# Patient Record
Sex: Female | Born: 1955
Health system: Southern US, Community
[De-identification: ages and names within clinical notes are randomized; demographics above are authoritative.]

## PROBLEM LIST (undated history)

## (undated) DIAGNOSIS — E559 Vitamin D deficiency, unspecified: Secondary | ICD-10-CM

## (undated) DIAGNOSIS — E119 Type 2 diabetes mellitus without complications: Secondary | ICD-10-CM

## (undated) DIAGNOSIS — I10 Essential (primary) hypertension: Secondary | ICD-10-CM

## (undated) DIAGNOSIS — F419 Anxiety disorder, unspecified: Secondary | ICD-10-CM

## (undated) DIAGNOSIS — R896 Abnormal cytological findings in specimens from other organs, systems and tissues: Secondary | ICD-10-CM

## (undated) DIAGNOSIS — N890 Mild vaginal dysplasia: Secondary | ICD-10-CM

## (undated) DIAGNOSIS — I82409 Acute embolism and thrombosis of unspecified deep veins of unspecified lower extremity: Secondary | ICD-10-CM

## (undated) DIAGNOSIS — M8580 Other specified disorders of bone density and structure, unspecified site: Secondary | ICD-10-CM

## (undated) DIAGNOSIS — N7011 Chronic salpingitis: Secondary | ICD-10-CM

## (undated) DIAGNOSIS — R32 Unspecified urinary incontinence: Secondary | ICD-10-CM

## (undated) HISTORY — DX: Vitamin D deficiency, unspecified: E55.9

## (undated) HISTORY — DX: Other specified disorders of bone density and structure, unspecified site: M85.80

## (undated) HISTORY — DX: Chronic salpingitis: N70.11

## (undated) HISTORY — DX: Abnormal cytological findings in specimens from other organs, systems and tissues: R89.6

## (undated) HISTORY — PX: CARDIAC CATHETERIZATION: SHX172

## (undated) HISTORY — DX: Type 2 diabetes mellitus without complications: E11.9

## (undated) HISTORY — PX: PELVIC LAPAROSCOPY: SHX162

## (undated) HISTORY — DX: Unspecified urinary incontinence: R32

## (undated) HISTORY — DX: Acute embolism and thrombosis of unspecified deep veins of unspecified lower extremity: I82.409

## (undated) HISTORY — DX: Mild vaginal dysplasia: N89.0

## (undated) HISTORY — DX: Anxiety disorder, unspecified: F41.9

---

## 1994-09-05 HISTORY — PX: MYOMECTOMY ABDOMINAL APPROACH: SUR870

## 1998-02-19 ENCOUNTER — Other Ambulatory Visit: Admission: RE | Admit: 1998-02-19 | Discharge: 1998-02-19 | Payer: Self-pay | Admitting: Gynecology

## 1998-06-19 ENCOUNTER — Ambulatory Visit (HOSPITAL_COMMUNITY): Admission: RE | Admit: 1998-06-19 | Discharge: 1998-06-19 | Payer: Self-pay | Admitting: Cardiovascular Disease

## 1999-08-12 ENCOUNTER — Other Ambulatory Visit: Admission: RE | Admit: 1999-08-12 | Discharge: 1999-08-12 | Payer: Self-pay | Admitting: Gynecology

## 1999-08-15 ENCOUNTER — Inpatient Hospital Stay (HOSPITAL_COMMUNITY): Admission: AD | Admit: 1999-08-15 | Discharge: 1999-08-15 | Payer: Self-pay | Admitting: Gynecology

## 1999-09-06 HISTORY — PX: ABDOMINAL HYSTERECTOMY: SHX81

## 1999-09-19 ENCOUNTER — Inpatient Hospital Stay (HOSPITAL_COMMUNITY): Admission: RE | Admit: 1999-09-19 | Discharge: 1999-09-19 | Payer: Self-pay | Admitting: Gynecology

## 1999-10-22 ENCOUNTER — Encounter (INDEPENDENT_AMBULATORY_CARE_PROVIDER_SITE_OTHER): Payer: Self-pay

## 1999-10-22 ENCOUNTER — Inpatient Hospital Stay (HOSPITAL_COMMUNITY): Admission: RE | Admit: 1999-10-22 | Discharge: 1999-10-24 | Payer: Self-pay | Admitting: Gynecology

## 2001-03-16 ENCOUNTER — Other Ambulatory Visit: Admission: RE | Admit: 2001-03-16 | Discharge: 2001-03-16 | Payer: Self-pay | Admitting: Gynecology

## 2002-03-27 ENCOUNTER — Other Ambulatory Visit: Admission: RE | Admit: 2002-03-27 | Discharge: 2002-03-27 | Payer: Self-pay | Admitting: Gynecology

## 2002-06-14 ENCOUNTER — Ambulatory Visit (HOSPITAL_COMMUNITY): Admission: RE | Admit: 2002-06-14 | Discharge: 2002-06-14 | Payer: Self-pay | Admitting: Internal Medicine

## 2002-06-14 ENCOUNTER — Encounter: Payer: Self-pay | Admitting: Internal Medicine

## 2004-03-10 ENCOUNTER — Other Ambulatory Visit: Admission: RE | Admit: 2004-03-10 | Discharge: 2004-03-10 | Payer: Self-pay | Admitting: Gynecology

## 2004-08-06 ENCOUNTER — Emergency Department (HOSPITAL_COMMUNITY): Admission: EM | Admit: 2004-08-06 | Discharge: 2004-08-06 | Payer: Self-pay | Admitting: Emergency Medicine

## 2006-02-03 ENCOUNTER — Other Ambulatory Visit: Admission: RE | Admit: 2006-02-03 | Discharge: 2006-02-03 | Payer: Self-pay | Admitting: Gynecology

## 2007-02-05 ENCOUNTER — Other Ambulatory Visit: Admission: RE | Admit: 2007-02-05 | Discharge: 2007-02-05 | Payer: Self-pay | Admitting: Gynecology

## 2008-09-07 ENCOUNTER — Emergency Department (HOSPITAL_COMMUNITY): Admission: EM | Admit: 2008-09-07 | Discharge: 2008-09-07 | Payer: Self-pay | Admitting: Emergency Medicine

## 2009-03-09 ENCOUNTER — Emergency Department (HOSPITAL_COMMUNITY): Admission: EM | Admit: 2009-03-09 | Discharge: 2009-03-09 | Payer: Self-pay | Admitting: Emergency Medicine

## 2009-03-13 ENCOUNTER — Encounter: Payer: Self-pay | Admitting: Gynecology

## 2009-03-13 ENCOUNTER — Other Ambulatory Visit: Admission: RE | Admit: 2009-03-13 | Discharge: 2009-03-13 | Payer: Self-pay | Admitting: Gynecology

## 2009-03-13 ENCOUNTER — Ambulatory Visit: Payer: Self-pay | Admitting: Gynecology

## 2010-01-19 ENCOUNTER — Ambulatory Visit: Payer: Self-pay | Admitting: Gynecology

## 2010-08-26 ENCOUNTER — Ambulatory Visit: Payer: Self-pay | Admitting: Vascular Surgery

## 2010-09-05 DIAGNOSIS — M8580 Other specified disorders of bone density and structure, unspecified site: Secondary | ICD-10-CM

## 2010-09-05 HISTORY — DX: Other specified disorders of bone density and structure, unspecified site: M85.80

## 2010-11-04 DIAGNOSIS — N890 Mild vaginal dysplasia: Secondary | ICD-10-CM

## 2010-11-04 HISTORY — DX: Mild vaginal dysplasia: N89.0

## 2010-11-24 ENCOUNTER — Encounter (INDEPENDENT_AMBULATORY_CARE_PROVIDER_SITE_OTHER): Payer: Private Health Insurance - Indemnity | Admitting: Gynecology

## 2010-11-24 ENCOUNTER — Other Ambulatory Visit: Payer: Self-pay | Admitting: Gynecology

## 2010-11-24 ENCOUNTER — Other Ambulatory Visit (HOSPITAL_COMMUNITY)
Admission: RE | Admit: 2010-11-24 | Discharge: 2010-11-24 | Disposition: A | Discharge status: Home / Self Care | Payer: Private Health Insurance - Indemnity | Source: Ambulatory Visit | Attending: Gynecology | Admitting: Gynecology

## 2010-11-24 DIAGNOSIS — R823 Hemoglobinuria: Secondary | ICD-10-CM

## 2010-11-24 DIAGNOSIS — Z124 Encounter for screening for malignant neoplasm of cervix: Secondary | ICD-10-CM | POA: Insufficient documentation

## 2010-11-24 DIAGNOSIS — Z833 Family history of diabetes mellitus: Secondary | ICD-10-CM

## 2010-11-24 DIAGNOSIS — Z01419 Encounter for gynecological examination (general) (routine) without abnormal findings: Secondary | ICD-10-CM

## 2010-11-24 DIAGNOSIS — Z13220 Encounter for screening for lipoid disorders: Secondary | ICD-10-CM

## 2010-12-07 ENCOUNTER — Encounter (INDEPENDENT_AMBULATORY_CARE_PROVIDER_SITE_OTHER): Payer: Private Health Insurance - Indemnity

## 2010-12-07 ENCOUNTER — Other Ambulatory Visit (INDEPENDENT_AMBULATORY_CARE_PROVIDER_SITE_OTHER): Payer: Private Health Insurance - Indemnity

## 2010-12-07 DIAGNOSIS — M899 Disorder of bone, unspecified: Secondary | ICD-10-CM

## 2010-12-07 DIAGNOSIS — E7800 Pure hypercholesterolemia, unspecified: Secondary | ICD-10-CM

## 2010-12-17 ENCOUNTER — Ambulatory Visit (INDEPENDENT_AMBULATORY_CARE_PROVIDER_SITE_OTHER): Payer: Private Health Insurance - Indemnity | Admitting: Gynecology

## 2010-12-17 DIAGNOSIS — N870 Mild cervical dysplasia: Secondary | ICD-10-CM

## 2010-12-17 DIAGNOSIS — E559 Vitamin D deficiency, unspecified: Secondary | ICD-10-CM

## 2010-12-20 LAB — COMPREHENSIVE METABOLIC PANEL
ALT: 21 U/L (ref 0–35)
BUN: 8 mg/dL (ref 6–23)
CO2: 25 mEq/L (ref 19–32)
Calcium: 9.1 mg/dL (ref 8.4–10.5)
Creatinine, Ser: 0.86 mg/dL (ref 0.4–1.2)
GFR calc non Af Amer: >60 mL/min (ref >60)
Glucose, Bld: 98 mg/dL (ref 70–99)
Sodium: 138 mEq/L (ref 135–145)
Total Protein: 6.4 g/dL (ref 6.0–8.3)

## 2010-12-20 LAB — CBC
Hemoglobin: 13.4 g/dL (ref 12.0–15.0)
MCHC: 33.5 g/dL (ref 30.0–36.0)
MCV: 85 fL (ref 78.0–100.0)
RBC: 4.7 MIL/uL (ref 3.87–5.11)
RDW: 15.3 % (ref 11.5–15.5)

## 2010-12-20 LAB — DIFFERENTIAL
Eosinophils Absolute: 0.2 10*3/uL (ref 0.0–0.7)
Lymphocytes Relative: 16 % (ref 12–46)
Lymphs Abs: 1.8 10*3/uL (ref 0.7–4.0)
Monocytes Relative: 5 % (ref 3–12)
Neutro Abs: 8.8 10*3/uL — ABNORMAL HIGH (ref 1.7–7.7)
Neutrophils Relative %: 77 % (ref 43–77)

## 2010-12-20 LAB — POCT CARDIAC MARKERS
CKMB, poc: <1 ng/mL — ABNORMAL LOW (ref 1.0–8.0)
Myoglobin, poc: 36.3 ng/mL (ref 12–200)
Troponin i, poc: <0.05 ng/mL (ref 0.00–0.09)
Troponin i, poc: <0.05 ng/mL (ref 0.00–0.09)

## 2010-12-20 LAB — LIPASE, BLOOD: Lipase: 25 U/L (ref 11–59)

## 2010-12-27 ENCOUNTER — Other Ambulatory Visit: Payer: Private Health Insurance - Indemnity

## 2011-01-18 NOTE — Assessment & Plan Note (Signed)
OFFICE VISIT   Tracey Buchanan  DOB:  1956/04/07                                       08/26/2010  ZOXWR#:[id number]   CHIEF COMPLAINT:  Bilateral leg pain.   HISTORY OF PRESENT ILLNESS:  The patient is a 55 year old female  referred by Dr. Hyacinth Meeker for evaluation of lower extremity pain and  swelling.  The patient states she has developed fatigue in her legs over  the last 4 years that has been slowly progressively worse.  She  occasionally has a sharp pain behind the knee on both sides.  She states  that the pain is worse and the swelling is worse when standing for long  periods.  She sits at her job usually.  She occasionally has to take  some ibuprofen for the pain but has not required stronger medication for  this.  She has also tried physical therapy in the past with some mild  improvement.  She also tried some mild compression with minimal  improvement.  She has no prior history of DVT.  She has no prior history  of trauma to her lower extremities.  She has no significant family  history of varicose veins or vascular disease.   CHRONIC MEDICAL PROBLEMS:  Hypertension and anxiety.  These are both  followed by Dr. Hyacinth Meeker.   PAST SURGICAL HISTORY:  Hysterectomy.   SOCIAL HISTORY:  She is single.  She is a Games developer.  She has no children.  She smokes half-pack of cigarettes per day.  She  drinks 1 glass of wine nightly.   FAMILY HISTORY:  As mentioned above.   REVIEW OF SYSTEMS:  A 12-point review of systems was performed with the  patient.  Please see intake referral form for details regarding this.   MEDICATIONS:  Benicar and lorazepam.   ALLERGIES:  She has no known drug allergies.   PHYSICAL EXAMINATION:  Blood pressure 136/76 in the left arm, heart rate  68 and regular, respirations 20.  HEENT:  Unremarkable.  Neck has 2+  carotid pulses.  Chest clear to auscultation.  Cardiac exam is regular  rate rhythm without murmur.   Abdomen is soft, nontender, nondistended.  No masses.  Extremities:  She has no major joint deformities.  She has  2+ radial, femoral, dorsalis pedis and posterior tibial pulses  bilaterally.  Skin has no open ulcers or rashes.  Neurologic exam shows  symmetric upper extremity motion and motor strength, which is 5/5.   She had bilateral venous duplex exam performed today, which showed no  evidence of DVT.  She did have incompetency and reflux in the common  femoral vein bilaterally.  The superficial and deep venous systems were  otherwise competent.   In summary, the patient has lower extremity pain and aching with  occasional swelling.  Her arterial circulation is intact with no  deficit.  She does have some evidence of venous incompetence with deep  venous reflux in the common femoral vein.  This certainly could  contribute to some of her symptoms.  I did have a lengthy discussion  with the patient today about the pathophysiology of this and informed  her that there are really no good surgical procedures for reflux of the  deep veins but that we could give her some symptomatic relief with  compression therapy.  She was  given a prescription today for thigh-high  compression stockings as well as knee-high compression stockings.  She  will try one of the two of these and try to wear her compression as much  as possible for symptomatic relief.  She will follow up with [place] on an as-  needed basis.     Janetta Hora. Fields, MD  Electronically Signed   CEF/MEDQ  D:  08/26/2010  T:  08/27/2010  Job:  [id number]   cc:   Dr. Jacalyn Lefevre

## 2011-01-18 NOTE — Procedures (Signed)
DUPLEX DEEP VENOUS EXAM - LOWER EXTREMITY   INDICATION:  Rule out venous insufficiency.   HISTORY:  Edema:  yes  Trauma/Surgery:  no  Pain:  yes  PE:  no  Previous DVT:  Yes in 1979  Anticoagulants:  no  Other:   DUPLEX EXAM:                CFV   SFV   PopV  PTV    GSV                R  L  R  L  R  L  R   L  R  L  Thrombosis    o  o  o  o  o  o  o   o  o  o  Spontaneous   +  +  +  +  +  +  +   +  +  +  Phasic        +  +  +  +  +  +  +   +  +  +  Augmentation  +  +  +  +  +  +  +   +  +  +  Compressible  +  +  +  +  +  +  +   +  +  +  Competent     0  0  +  +  +  +  +   +  +  +   Legend:  + - yes  o - no  p - partial  D - decreased   IMPRESSION:  1. Bilateral lower extremity deep veins are positive for clinically      significant insufficiency at the level of the common femoral vein.  2. Negative study for deep venous thrombosis.    _____________________________  Janetta Hora Darrick Penna, MD   EM/MEDQ  D:  08/26/2010  T:  08/26/2010  Job:  [id number]

## 2011-01-21 NOTE — Op Note (Signed)
[facility]  Patient:    Tracey Buchanan, Tracey Buchanan                      MRN: [id number] Proc. Date: 10/22/99 Adm. Date:  [id number] Attending:  Merrily Pew                           Operative Report  PREOPERATIVE DIAGNOSIS:       Leiomyomata.  POSTOPERATIVE DIAGNOSIS:      Leiomyomata.  OPERATION:                    Total abdominal hysterectomy, lysis of adhesions.   SURGEON:                      Timothy P. Fontaine, M.D.  ASSISTANT:                    Douglass Rivers, M.D.  ANESTHESIA:                   General endotracheal.  ESTIMATED BLOOD LOSS:         400 cc.  COMPLICATIONS:                None.  SPECIMENS:                    Uterus, right fallopian tube.  FINDINGS:                     Uterus enlarged with leiomyoma.  Sigmoid colon adherent entire posterior uterine wall.  Bladder adherent along anterior uterine wall.  Right and left ovaries adherent to uterine sidewall.  Appendix grossly normal.  Upper abdominal examination digitally within normal limits.  DESCRIPTION OF PROCEDURE:     The patient was taken to the operating room and underwent general endotracheal anesthesia.  She received an abdominal, perineal, and vaginal preparation with Betadine scrub and Betadine solution.  Foley was placed with sterile technique, and the patient was draped in the usual fashion.  The abdomen was sharply entered through a repeat Pfannenstiel incision achieving adequate hemostasis at all levels.  Balfour retractor and bladder blade were placed within the incisions, and the intestines were packed from the operative field. The sigmoid colon was adherent along the posterior uterine wall which was sharply lysed.  The bladder, perineum was adherent along the anterior uterine wall which again was sharply lysed.  The right and left ovaries were adherent to the uterine sidewalls and these were bluntly and sharply lysed to free up the ovaries.   The  left round ligament was identified, ligated and sharply incised and the anterior vesicouterine perineum was sharply incised at the midline.  A similar procedure was carried out on the other side.  The bladder flap was sharply developed initially and the left uterine ovarian pedicle was identified, doubly clamped, cut, and doubly ligated using 0 Vicryl suture.  A similar procedure was carried out on the other side noting that the right fallopian tube was sacrificed due to adhesions. The right uterine vessels were then skeletonized, clamped, cut, and doubly ligated using 0 Vicryl suture.  A similar procedure was carried out on the other side. The anterior bladder flap was continually developed and the uterus was progressively freed from its attachment through clamping, cutting, and ligating of the parametrial tissues and cardinal ligaments using 0 Vicryl suture.  The uterus was excised from the lower uterine segment during this procedure to facilitate visualization.  The vagina was then sharply entered anteriorly and the cervical  stump was excised circumferentially without difficulty.  The right and left vaginal angle sutures were then placed intact for future reference.  The vagina was then run using 0 Vicryl suture in a running interlocking stitch and subsequently the  vagina was reapproximated anterior to posterior using an interrupted figure-of-eight 0 Vicryl stitch.  The pelvis was copiously irrigated with several small bleeding points coagulated using the coagulation as well as interrupted 3-0 simple stitch.  Copious irrigation of the pelvis showed adequate hemostasis and  subsequently the bowel packing was removed.  The Balfour bladder removed and the anterior fascia was reapproximated using 0 Vicryl suture starting at the angle nd meeting in the middle.  Subcutaneous tissues were irrigated and adequate hemostasis visualized and the skin was reapproximated with  staples.  A sterile dressing was applied and the patient was taken to the recovery room in good condition having  tolerated her procedure well.  Urine output during the case was approximately 100 cc of clear yellow urine. DD:  10/22/99 TD:  10/23/99 Job: [id number]

## 2011-01-21 NOTE — Discharge Summary (Signed)
[facility]  Patient:    Tracey Buchanan, Tracey Buchanan                      MRN: [id number] Adm. Date:  [id number] Disc. Date: [id number] Attending:  Merrily Pew Dictator:   Gwendolyn Fill. Gatewood, P.A.                           Discharge Summary  DISCHARGE DIAGNOSES:          1. Leiomyomata.                               2. Status post total abdominal hysterectomy, lysis of                                  adhesions.  HISTORY OF PRESENT ILLNESS:   A 55 year old female with increasing menorrhagia,  dysmenorrhea, and anemia, and history of prior myomectomy in 1996 with reaccummulation of the growth of the fibroid uterus.  The patient was placed on  Lupron preoperatively suppression to allow for recovery of her hemoglobin and therefore admitted for definitive surgery.  HOSPITAL COURSE:              On October 22, 1999, the patient was admitted and underwent a total abdominal hysterectomy and lysis of adhesions without complications.  The uterus was found to be large with leiomyoma.  The sigmoid colon was adherent to the entire posterior uterine wall and the bladder was adherent along the anterior uterine wall, as well as right and left ovaries were noted to be adhered to the uterine side wall.  Postoperatively, the patient remained afebrile, voiding, and in stable condition.  She was discharged to home on October 24, 1999, and given [facility] Gynecology postoperative instructions as well as postoperative booklet.  ACCESSORY CLINICAL DATA:      On October 24, 1999, hemoglobin was 10.8, hematocrit 34.3.  DISPOSITION:                  The patient is discharged to home and given a prescription for Tylox to take p.r.n. pain.  She was to follow up in the office in two weeks and if she had any problems prior to that time to be seen in the office. She was given Premarin 0.625 q.d. DD:  11/08/99 TD:  11/09/99 Job: [id number]

## 2011-07-08 ENCOUNTER — Encounter (HOSPITAL_BASED_OUTPATIENT_CLINIC_OR_DEPARTMENT_OTHER): Payer: Self-pay

## 2011-07-08 ENCOUNTER — Ambulatory Visit (HOSPITAL_BASED_OUTPATIENT_CLINIC_OR_DEPARTMENT_OTHER): Admit: 2011-07-08 | Payer: Self-pay | Admitting: Interventional Cardiology

## 2011-07-08 SURGERY — JV LEFT AND RIGHT HEART CATHETERIZATION WITH CORONARY ANGIOGRAM
Anesthesia: Moderate Sedation

## 2011-07-28 ENCOUNTER — Emergency Department (HOSPITAL_COMMUNITY): Payer: Private Health Insurance - Indemnity

## 2011-07-28 ENCOUNTER — Emergency Department (HOSPITAL_COMMUNITY)
Admission: EM | Admit: 2011-07-28 | Discharge: 2011-07-28 | Disposition: A | Discharge status: Home / Self Care | Payer: Private Health Insurance - Indemnity | Attending: Emergency Medicine | Admitting: Emergency Medicine

## 2011-07-28 ENCOUNTER — Other Ambulatory Visit: Payer: Self-pay

## 2011-07-28 DIAGNOSIS — R42 Dizziness and giddiness: Secondary | ICD-10-CM | POA: Insufficient documentation

## 2011-07-28 DIAGNOSIS — R51 Headache: Secondary | ICD-10-CM | POA: Insufficient documentation

## 2011-07-28 DIAGNOSIS — R079 Chest pain, unspecified: Secondary | ICD-10-CM | POA: Insufficient documentation

## 2011-07-28 HISTORY — DX: Essential (primary) hypertension: I10

## 2011-07-28 LAB — CBC
HCT: 40.1 % (ref 36.0–46.0)
Hemoglobin: 13.4 g/dL (ref 12.0–15.0)
MCHC: 33.4 g/dL (ref 30.0–36.0)
MCV: 81.3 fL (ref 78.0–100.0)
RDW: 15.2 % (ref 11.5–15.5)

## 2011-07-28 LAB — COMPREHENSIVE METABOLIC PANEL
Albumin: 3.8 g/dL (ref 3.5–5.2)
BUN: 11 mg/dL (ref 6–23)
CO2: 27 mEq/L (ref 19–32)
Calcium: 9.7 mg/dL (ref 8.4–10.5)
Chloride: 105 mEq/L (ref 96–112)
Creatinine, Ser: 0.89 mg/dL (ref 0.50–1.10)
GFR calc non Af Amer: 71 mL/min — ABNORMAL LOW (ref >90)
Total Bilirubin: 0.2 mg/dL — ABNORMAL LOW (ref 0.3–1.2)

## 2011-07-28 LAB — DIFFERENTIAL
Basophils Relative: 0 % (ref 0–1)
Eosinophils Relative: 3 % (ref 0–5)
Monocytes Absolute: 0.5 10*3/uL (ref 0.1–1.0)
Monocytes Relative: 9 % (ref 3–12)
Neutro Abs: 2.6 10*3/uL (ref 1.7–7.7)

## 2011-07-28 LAB — CARDIAC PANEL(CRET KIN+CKTOT+MB+TROPI)
CK, MB: 1.9 ng/mL (ref 0.3–4.0)
Total CK: 104 U/L (ref 7–177)
Troponin I: <0.3 ng/mL (ref <0.30)

## 2011-07-28 MED ORDER — DIPHENHYDRAMINE HCL 50 MG/ML IJ SOLN
INTRAMUSCULAR | Status: AC
  Filled 2011-07-28: qty 1

## 2011-07-28 MED ORDER — METOCLOPRAMIDE HCL 5 MG/ML IJ SOLN
INTRAMUSCULAR | Status: AC
  Filled 2011-07-28: qty 2

## 2011-07-28 MED ORDER — SODIUM CHLORIDE 0.9 % IV SOLN
Freq: Once | INTRAVENOUS | Status: AC
  Administered 2011-07-28: 13:00:00 via INTRAVENOUS

## 2011-07-28 MED ORDER — LORAZEPAM 2 MG/ML IJ SOLN
1.0000 mg | Freq: Once | INTRAMUSCULAR | Status: AC
  Administered 2011-07-28: 1 mg via INTRAVENOUS

## 2011-07-28 MED ORDER — DIPHENHYDRAMINE HCL 50 MG/ML IJ SOLN
25.0000 mg | Freq: Once | INTRAMUSCULAR | Status: AC
  Administered 2011-07-28: 25 mg via INTRAVENOUS

## 2011-07-28 MED ORDER — METOCLOPRAMIDE HCL 5 MG/ML IJ SOLN
10.0000 mg | Freq: Once | INTRAMUSCULAR | Status: AC
  Administered 2011-07-28: 10 mg via INTRAVENOUS

## 2011-07-28 MED ORDER — LORAZEPAM 2 MG/ML IJ SOLN
INTRAMUSCULAR | Status: AC
  Filled 2011-07-28: qty 1

## 2011-07-28 NOTE — ED Notes (Signed)
Pt requesting medication and for [place] to contact the dr because " I'm going home".

## 2011-07-28 NOTE — ED Provider Notes (Signed)
History     CSN: [id number] Arrival date & time: 07/28/2011 12:05 PM   First MD Initiated Contact with Patient 07/28/11 1231      Chief Complaint  Patient presents with  . Chest Pain  . Dizziness  . Headache    (Consider location/radiation/quality/duration/timing/severity/associated sxs/prior treatment) Patient is a 55 y.o. female presenting with chest pain and headaches. The history is provided by the patient.  Chest Pain    Headache    patient here with headache that started yesterday described as being dull and aching on the right side of her head. No fever or, photophobia, neck pain. Nothing makes her symptoms better or worse no prior history of same, denies any syncope. States she feels that this may be due to increased stress at her place of work.  Patient also complains of having intermittent chest pain described as heaviness substernal nonradiating. Some associated dyspnea and denies diaphoresis or vomiting. History of similar symptoms in the past, was told by her cardiologist to have a heart catheterization in June of this year but she deferred. She denies any exertional component to her chest pain, no cough noted.  No past medical history on file.  No past surgical history on file.  No family history on file.  History  Substance Use Topics  . Smoking status: Not on file  . Smokeless tobacco: Not on file  . Alcohol Use: Not on file    OB History    No data available      Review of Systems  Cardiovascular: Positive for chest pain.  Neurological: Positive for headaches.  All other systems reviewed and are negative.    Allergies  Review of patient's allergies indicates not on file.  Home Medications  No current outpatient prescriptions on file.  BP 143/69  Pulse 55  Temp 97.8 F (36.6 C)  Resp 18  Ht 5\' 7"  (1.702 m)  Wt 180 lb (81.647 kg)  BMI 28.19 kg/m2  SpO2 99%  Physical Exam  Nursing note and vitals reviewed. Constitutional: She is  oriented to person, place, and time. She appears well-developed and well-nourished.  Non-toxic appearance. No distress.  HENT:  Head: Normocephalic and atraumatic.  Eyes: Conjunctivae, EOM and lids are normal. Pupils are equal, round, and reactive to light.  Neck: Normal range of motion. Neck supple. No tracheal deviation present. No mass present.  Cardiovascular: Normal rate, regular rhythm and normal heart sounds.  Exam reveals no gallop.   No murmur heard. Pulmonary/Chest: Effort normal and breath sounds normal. No stridor. No respiratory distress. She has no decreased breath sounds. She has no wheezes. She has no rhonchi. She has no rales.  Abdominal: Soft. Normal appearance and bowel sounds are normal. She exhibits no distension. There is no tenderness. There is no rebound and no CVA tenderness.  Musculoskeletal: Normal range of motion. She exhibits no edema and no tenderness.  Neurological: She is alert and oriented to person, place, and time. She has normal strength. No cranial nerve deficit or sensory deficit. GCS eye subscore is 4. GCS verbal subscore is 5. GCS motor subscore is 6.  Skin: Skin is warm and dry. No abrasion and no rash noted.  Psychiatric: She has a normal mood and affect. Her speech is normal and behavior is normal.    ED Course  Procedures (including critical care time)   Labs Reviewed  CBC  DIFFERENTIAL  COMPREHENSIVE METABOLIC PANEL  CARDIAC PANEL(CRET KIN+CKTOT+MB+TROPI)   No results found.   No diagnosis found.  MDM   Date: 07/28/2011  Rate: 57  Rhythm: normal sinus rhythm  QRS Axis: normal  Intervals: normal  ST/T Wave abnormalities: nonspecific ST changes  Conduction Disutrbances:none  Narrative Interpretation:   Old EKG Reviewed: unchanged        3:33 PM Pt given meds for headache, feels better--repeat neuro exam stable, pt feels that current sx are related to her menopause--pt offered admission for eval for her chest pain and she  has declined, she will f/u her cardiologist next week  Toy Baker, MD 07/28/11 1538

## 2011-11-04 DIAGNOSIS — IMO0001 Reserved for inherently not codable concepts without codable children: Secondary | ICD-10-CM

## 2011-11-04 HISTORY — DX: Reserved for inherently not codable concepts without codable children: IMO0001

## 2011-11-11 DIAGNOSIS — N7011 Chronic salpingitis: Secondary | ICD-10-CM | POA: Insufficient documentation

## 2011-11-11 DIAGNOSIS — F419 Anxiety disorder, unspecified: Secondary | ICD-10-CM | POA: Insufficient documentation

## 2011-11-11 DIAGNOSIS — M8580 Other specified disorders of bone density and structure, unspecified site: Secondary | ICD-10-CM | POA: Insufficient documentation

## 2011-11-11 DIAGNOSIS — I10 Essential (primary) hypertension: Secondary | ICD-10-CM | POA: Insufficient documentation

## 2011-11-11 DIAGNOSIS — IMO0002 Reserved for concepts with insufficient information to code with codable children: Secondary | ICD-10-CM | POA: Insufficient documentation

## 2011-11-11 DIAGNOSIS — I82409 Acute embolism and thrombosis of unspecified deep veins of unspecified lower extremity: Secondary | ICD-10-CM | POA: Insufficient documentation

## 2011-11-25 ENCOUNTER — Ambulatory Visit (INDEPENDENT_AMBULATORY_CARE_PROVIDER_SITE_OTHER): Payer: Managed Care, Other (non HMO) | Admitting: Gynecology

## 2011-11-25 ENCOUNTER — Other Ambulatory Visit (HOSPITAL_COMMUNITY)
Admission: RE | Admit: 2011-11-25 | Discharge: 2011-11-25 | Disposition: A | Discharge status: Home / Self Care | Payer: Managed Care, Other (non HMO) | Source: Ambulatory Visit | Attending: Gynecology | Admitting: Gynecology

## 2011-11-25 ENCOUNTER — Encounter: Payer: Self-pay | Admitting: Gynecology

## 2011-11-25 VITALS — BP 122/64 | Ht 66.0 in | Wt 190.0 lb

## 2011-11-25 DIAGNOSIS — M8580 Other specified disorders of bone density and structure, unspecified site: Secondary | ICD-10-CM

## 2011-11-25 DIAGNOSIS — Z1159 Encounter for screening for other viral diseases: Secondary | ICD-10-CM | POA: Insufficient documentation

## 2011-11-25 DIAGNOSIS — M899 Disorder of bone, unspecified: Secondary | ICD-10-CM

## 2011-11-25 DIAGNOSIS — Z13220 Encounter for screening for lipoid disorders: Secondary | ICD-10-CM

## 2011-11-25 DIAGNOSIS — Z01419 Encounter for gynecological examination (general) (routine) without abnormal findings: Secondary | ICD-10-CM | POA: Insufficient documentation

## 2011-11-25 MED ORDER — ESTRADIOL 0.0375 MG/24HR TD PTTW: 1.0000 | MEDICATED_PATCH | TRANSDERMAL | Status: DC

## 2011-11-25 MED ORDER — LORAZEPAM 0.5 MG PO TABS: 0.5000 mg | ORAL_TABLET | Freq: Three times a day (TID) | ORAL | Status: DC

## 2011-11-25 NOTE — Progress Notes (Signed)
Tracey Buchanan Dec 03, 1955 [id number]        56 y.o.  for annual exam.  Several issues below.  Past medical history,surgical history, medications, allergies, family history and social history were all reviewed and documented in the EPIC chart. ROS:  Was performed and pertinent positives and negatives are included in the history.  Exam: Tracey Buchanan chaperone present Filed Vitals:   11/25/11 1519  BP: 122/64   General appearance  Normal Skin grossly normal Head/Neck normal with no cervical or supraclavicular adenopathy thyroid normal Lungs  clear Cardiac RR, without RMG Abdominal  soft, nontender, without masses, organomegaly or hernia Breasts  examined lying and sitting without masses, retractions, discharge or axillary adenopathy. Pelvic  Ext/BUS/vagina  normal pap of cuff done  Adnexa  Without masses or tenderness    Anus and perineum  normal   Rectovaginal  normal sphincter tone without palpated masses or tenderness.    Assessment/Plan:  56 y.o. female for annual exam.    1. VAIN I.  Patient had VAIN I of the vaginal cuff March 2012. Follow up colposcopy was negative no biopsies taken. She was supposed to follow up in 6 months for Pap smear but did not involve now. Pap was done. If negative then we'll plan every six-month Pap smear to several negatives otherwise we'll triage based on results. 2. Hot flushes. Patient's having significant hot flashes/night sweats. She had tried Effexor and soy in the past and it did not help. They're becoming life altering. I reviewed options with her to include ERT. She does have a history of DVT as a teenager on birth control pills and she does smoke. I reviewed the risks of ERT particularly with this history to include a realistic risk of thrombosis, stroke, heart attack, DVT. I discussed the WHI study possible breast cancer risks also reviewed. After a lengthy discussion patient wants to try ERT. I discussed the advantages of transdermal to hopefully  decrease the risk of thrombosis and she is willing to try this and accepts the risks. I prescribed minivelle 0.0375 as a trial. If she does well with this she'll continue if she continues to have significant symptoms or call me and we'll move her up to 0.05. 3. Osteopenia.  DEXA 2012 showed a T score -1.3. She is on extra calcium vitamin D. When check baseline vitamin D today plan on repeating her bone density next year at a two-year interval. 4. Mammography. She had her mammogram last year and she noticed repeated this year. SBE mildly reviewed. 5. Colonoscopy. Patient knows to schedule this and the importance to do so and agrees to arrange this. I gave her names of gastroenterology practice is Tracey Buchanan to make the arrangements. 6. Stop smoking. Again I discussed of smoking. 7. Health maintenance. Patient does have a primary and had some blood work done this year but I could not find a lipid profile I went ahead and ordered a lipid profile along with her vitamin D and a urinalysis. I also refilled her lorazepam 0.5 mg #30 with 2 refills that she uses when necessary for anxiety as she is in a high stress job and finds that this helps her. Assuming she continues well then she will see me in a year, sooner if any issues with her ERT.    Dara Lords MD, 5:16 PM 11/25/2011

## 2011-11-25 NOTE — Patient Instructions (Signed)
Start estrogen replacement patches as discussed. Call me if you have any issues or questions.   Consider Stop Smoking.  Help is available at [facility] smoking cessation program @ www.[place].com or 870-052-9867. OR 1-800-QUIT-NOW (905)115-9189) for free smoking cessation counseling.   Smoking Hazards Smoking cigarettes is extremely bad for your health. Tobacco smoke has over 200 known poisons in it. There are over 60 chemicals in tobacco smoke that cause cancer. Some of the chemicals found in cigarette smoke include:  Cyanide.  Benzene.  Formaldehyde.  Methanol (wood alcohol).  Acetylene (fuel used in welding torches).  Ammonia.  Cigarette smoke also contains the poisonous gases nitrogen oxide and carbon monoxide.  Cigarette smokers have an increased risk of many serious medical problems, including: Lung cancer.  Lung disease (such as pneumonia, bronchitis, and emphysema).  Heart attack and chest pain due to the heart not getting enough oxygen (angina).  Heart disease and peripheral blood vessel disease.  Hypertension.  Stroke.  Oral cancer (cancer of the lip, mouth, or voice box).  Bladder cancer.  Pancreatic cancer.  Cervical cancer.  Pregnancy complications, including premature birth.  Low birthweight babies.  Early menopause.  Lower estrogen level for women.  Infertility.  Facial wrinkles.  Blindness.  Increased risk of broken bones (fractures).  Senile dementia.  Stillbirths and smaller newborn babies, birth defects, and genetic damage to sperm.  Stomach ulcers and internal bleeding.  Children of smokers have an increased risk of the following, because of secondhand smoke exposure:  Sudden infant death syndrome (SIDS).  Respiratory infections.  Lung cancer.  Heart disease.  Ear infections.  Smoking causes approximately: 90% of all lung cancer deaths in men.  80% of all lung cancer deaths in women.  90% of deaths from chronic obstructive lung  disease.  Compared with nonsmokers, smoking increases the risk of: Coronary heart disease by 2 to 4 times.  Stroke by 2 to 4 times.  Men developing lung cancer by 23 times.  Women developing lung cancer by 13 times.  Dying from chronic obstructive lung diseases by 12 times.  Someone who smokes 2 packs a day loses about 8 years of his or her life. Even smoking lightly shortens your life expectancy by several years. You can greatly reduce the risk of medical problems for you and your family by stopping now. Smoking is the most preventable cause of death and disease in our society. Within days of quitting smoking, your circulation returns to normal, you decrease the risk of having a heart attack, and your lung capacity improves. There may be some increased phlegm in the first few days after quitting, and it may take months for your lungs to clear up completely. Quitting for 10 years cuts your lung cancer risk to almost that of a nonsmoker. WHY IS SMOKING ADDICTIVE? Nicotine is the chemical agent in tobacco that is capable of causing addiction or dependence.  When you smoke and inhale, nicotine is absorbed rapidly into the bloodstream through your lungs. Nicotine absorbed through the lungs is capable of creating a powerful addiction. Both inhaled and non-inhaled nicotine may be addictive.  Addiction studies of cigarettes and spit tobacco show that addiction to nicotine occurs mainly during the teen years, when young people begin using tobacco products.  WHAT ARE THE BENEFITS OF QUITTING?  There are many health benefits to quitting smoking.  Likelihood of developing cancer and heart disease decreases. Health improvements are seen almost immediately.  Blood pressure, pulse rate, and breathing patterns start returning  to normal soon after quitting.  People who quit may see an improvement in their overall quality of life.  Some people choose to quit all at once. Other options include nicotine replacement  products, such as patches, gum, and nasal sprays. Do not use these products without first checking with your caregiver. QUITTING SMOKING It is not easy to quit smoking. Nicotine is addicting, and longtime habits are hard to change. To start, you can write down all your reasons for quitting, tell your family and friends you want to quit, and ask for their help. Throw your cigarettes away, chew gum or cinnamon sticks, keep your hands busy, and drink extra water or juice. Go for walks and practice deep breathing to relax. Think of all the money you are saving: around $1,000 a year, for the average pack-a-day smoker. Nicotine patches and gum have been shown to improve success at efforts to stop smoking. Zyban (bupropion) is an anti-depressant drug that can be prescribed to reduce nicotine withdrawal symptoms and to suppress the urge to smoke. Smoking is an addiction with both physical and psychological effects. Joining a stop-smoking support group can help you cope with the emotional issues. For more information and advice on programs to stop smoking, call your doctor, your local hospital, or these organizations: American Lung Association - 1-800-LUNGUSA  American Cancer Society - 1-800-ACS-2345  Document Released: 09/29/2004 Document Revised: 05/04/2011 Document Reviewed: 06/03/2009 [facility] Patient Information 2012 [place], [place].  Smoking Cessation This document explains the best ways for you to quit smoking and new treatments to help. It lists new medicines that can double or triple your chances of quitting and quitting for good. It also considers ways to avoid relapses and concerns you may have about quitting, including weight gain. NICOTINE: A POWERFUL ADDICTION If you have tried to quit smoking, you know how hard it can be. It is hard because nicotine is a very addictive drug. For some people, it can be as addictive as heroin or cocaine. Usually, people make 2 or 3 tries, or more, before finally  being able to quit. Each time you try to quit, you can learn about what helps and what hurts. Quitting takes hard work and a lot of effort, but you can quit smoking. QUITTING SMOKING IS ONE OF THE MOST IMPORTANT THINGS YOU WILL EVER DO.  You will live longer, feel better, and live better.   The impact on your body of quitting smoking is felt almost immediately:   Within 20 minutes, blood pressure decreases. Pulse returns to its normal level.   After 8 hours, carbon monoxide levels in the blood return to normal. Oxygen level increases.   After 24 hours, chance of heart attack starts to decrease. Breath, hair, and body stop smelling like smoke.   After 48 hours, damaged nerve endings begin to recover. Sense of taste and smell improve.   After 72 hours, the body is virtually free of nicotine. Bronchial tubes relax and breathing becomes easier.   After 2 to 12 weeks, lungs can hold more air. Exercise becomes easier and circulation improves.   Quitting will reduce your risk of having a heart attack, stroke, cancer, or lung disease:   After 1 year, the risk of coronary heart disease is cut in half.   After 5 years, the risk of stroke falls to the same as a nonsmoker.   After 10 years, the risk of lung cancer is cut in half and the risk of other cancers decreases significantly.  After 15 years, the risk of coronary heart disease drops, usually to the level of a nonsmoker.   If you are pregnant, quitting smoking will improve your chances of having a healthy baby.   The people you live with, especially your children, will be healthier.   You will have extra money to spend on things other than cigarettes.  FIVE KEYS TO QUITTING Studies have shown that these 5 steps will help you quit smoking and quit for good. You have the best chances of quitting if you use them together: 1. Get ready.  2. Get support and encouragement.  3. Learn new skills and behaviors.  4. Get medicine to reduce  your nicotine addiction and use it correctly.  5. Be prepared for relapse or difficult situations. Be determined to continue trying to quit, even if you do not succeed at first.  1. GET READY  Set a quit date.   Change your environment.   Get rid of ALL cigarettes, ashtrays, matches, and lighters in your home, car, and place of work.   Do not let people smoke in your home.   Review your past attempts to quit. Think about what worked and what did not.   Once you quit, do not smoke. NOT EVEN A PUFF!  2. GET SUPPORT AND ENCOURAGEMENT Studies have shown that you have a better chance of being successful if you have help. You can get support in many ways.  Tell your family, friends, and coworkers that you are going to quit and need their support. Ask them not to smoke around you.   Talk to your caregivers (doctor, dentist, nurse, pharmacist, psychologist, and/or smoking counselor).   Get individual, group, or telephone counseling and support. The more counseling you have, the better your chances are of quitting. Programs are available at [employer] and health centers. Call your local health department for information about programs in your area.   Spiritual beliefs and practices may help some smokers quit.   Quit meters are Photographer that keep track of quit statistics, such as amount of "quit-time," cigarettes not smoked, and money saved.   Many smokers find one or more of the many self-help books available useful in helping them quit and stay off tobacco.  3. LEARN NEW SKILLS AND BEHAVIORS  Try to distract yourself from urges to smoke. Talk to someone, go for a walk, or occupy your time with a task.   When you first try to quit, change your routine. Take a different route to work. Drink tea instead of coffee. Eat breakfast in a different place.   Do something to reduce your stress. Take a hot bath, exercise, or read a book.   Plan something  enjoyable to do every day. Reward yourself for not smoking.   Explore interactive web-based programs that specialize in helping you quit.  4. GET MEDICINE AND USE IT CORRECTLY Medicines can help you stop smoking and decrease the urge to smoke. Combining medicine with the above behavioral methods and support can quadruple your chances of successfully quitting smoking. The U.S. Food and Drug Administration (FDA) has approved 7 medicines to help you quit smoking. These medicines fall into 3 categories.  Nicotine replacement therapy (delivers nicotine to your body without the negative effects and risks of smoking):   Nicotine gum: Available over-the-counter.   Nicotine lozenges: Available over-the-counter.   Nicotine inhaler: Available by prescription.   Nicotine nasal spray: Available by prescription.   Nicotine skin  patches (transdermal): Available by prescription and over-the-counter.   Antidepressant medicine (helps people abstain from smoking, but how this works is unknown):   Bupropion sustained-release (SR) tablets: Available by prescription.   Nicotinic receptor partial agonist (simulates the effect of nicotine in your brain):   Varenicline tartrate tablets: Available by prescription.   Ask your caregiver for advice about which medicines to use and how to use them. Carefully read the information on the package.   Everyone who is trying to quit may benefit from using a medicine. If you are pregnant or trying to become pregnant, nursing an infant, you are under age 11, or you smoke fewer than 10 cigarettes per day, talk to your caregiver before taking any nicotine replacement medicines.   You should stop using a nicotine replacement product and call your caregiver if you experience nausea, dizziness, weakness, vomiting, fast or irregular heartbeat, mouth problems with the lozenge or gum, or redness or swelling of the skin around the patch that does not go away.   Do not use any  other product containing nicotine while using a nicotine replacement product.   Talk to your caregiver before using these products if you have diabetes, heart disease, asthma, stomach ulcers, you had a recent heart attack, you have high blood pressure that is not controlled with medicine, a history of irregular heartbeat, or you have been prescribed medicine to help you quit smoking.  5. BE PREPARED FOR RELAPSE OR DIFFICULT SITUATIONS  Most relapses occur within the first 3 months after quitting. Do not be discouraged if you start smoking again. Remember, most people try several times before they finally quit.   You may have symptoms of withdrawal because your body is used to nicotine. You may crave cigarettes, be irritable, feel very hungry, cough often, get headaches, or have difficulty concentrating.   The withdrawal symptoms are only temporary. They are strongest when you first quit, but they will go away within 10 to 14 days.  Here are some difficult situations to watch for:  Alcohol. Avoid drinking alcohol. Drinking lowers your chances of successfully quitting.   Caffeine. Try to reduce the amount of caffeine you consume. It also lowers your chances of successfully quitting.   Other smokers. Being around smoking can make you want to smoke. Avoid smokers.   Weight gain. Many smokers will gain weight when they quit, usually less than 10 pounds. Eat a healthy diet and stay active. Do not let weight gain distract you from your main goal, quitting smoking. Some medicines that help you quit smoking may also help delay weight gain. You can always lose the weight gained after you quit.   Bad mood or depression. There are a lot of ways to improve your mood other than smoking.  If you are having problems with any of these situations, talk to your caregiver. SPECIAL SITUATIONS AND CONDITIONS Studies suggest that everyone can quit smoking. Your situation or condition can give you a special reason to  quit.  Pregnant women/new mothers: By quitting, you protect your baby's health and your own.   Hospitalized patients: By quitting, you reduce health problems and help healing.   Heart attack patients: By quitting, you reduce your risk of a second heart attack.   Lung, head, and neck cancer patients: By quitting, you reduce your chance of a second cancer.   Parents of children and adolescents: By quitting, you protect your children from illnesses caused by secondhand smoke.  QUESTIONS TO THINK ABOUT Think about  the following questions before you try to stop smoking. You may want to talk about your answers with your caregiver.  Why do you want to quit?   If you tried to quit in the past, what helped and what did not?   What will be the most difficult situations for you after you quit? How will you plan to handle them?   Who can help you through the tough times? Your family? Friends? Caregiver?   What pleasures do you get from smoking? What ways can you still get pleasure if you quit?  Here are some questions to ask your caregiver:  How can you help me to be successful at quitting?   What medicine do you think would be best for me and how should I take it?   What should I do if I need more help?   What is smoking withdrawal like? How can I get information on withdrawal?  Quitting takes hard work and a lot of effort, but you can quit smoking. FOR MORE INFORMATION  Smokefree.gov (http://www.dav[employer].com/) provides free, accurate, evidence-based information and professional assistance to help support the immediate and long-term needs of people trying to quit smoking. Document Released: 08/16/2001 Document Revised: 05/04/2011 Document Reviewed: 06/08/2009 [facility] Patient Information 2012 [place], [place].

## 2011-11-26 LAB — LIPID PANEL
Total CHOL/HDL Ratio: 5.2 Ratio
VLDL: 43 mg/dL — ABNORMAL HIGH (ref 0–40)

## 2011-11-26 LAB — URINALYSIS W MICROSCOPIC + REFLEX CULTURE
Leukocytes, UA: NEGATIVE
Nitrite: NEGATIVE
Protein, ur: NEGATIVE mg/dL
Squamous Epithelial / LPF: NONE SEEN
Urobilinogen, UA: 1 mg/dL (ref 0.0–1.0)

## 2011-11-29 ENCOUNTER — Telehealth: Payer: Self-pay | Admitting: *Deleted

## 2011-11-29 NOTE — Telephone Encounter (Signed)
Pt called stating pharmacy never got rx for ativan 5 mg, rx called into pharmacy, pt informed with the below note.

## 2011-12-08 ENCOUNTER — Telehealth: Payer: Self-pay

## 2011-12-08 ENCOUNTER — Other Ambulatory Visit: Payer: Self-pay

## 2011-12-08 ENCOUNTER — Encounter: Payer: Self-pay | Admitting: Gynecology

## 2011-12-08 DIAGNOSIS — E7800 Pure hypercholesterolemia, unspecified: Secondary | ICD-10-CM

## 2011-12-08 MED ORDER — ERGOCALCIFEROL 1.25 MG (50000 UT) PO CAPS: 50000.0000 [IU] | ORAL_CAPSULE | ORAL | Status: AC

## 2011-12-08 NOTE — Telephone Encounter (Signed)
Okay to take the vitamin D 50,000 weekly x12 weeks and then recheck a vitamin D level then.

## 2011-12-08 NOTE — Telephone Encounter (Addendum)
Patient was informed of below results.  She is taking Calcium daily with Vit D 500 iu.  I told her she would need to get her OTC Vit D. Supplement and take 2000 iu total each day.  She asked could you not phone it in for her.  She said her RX's have a copymt and OTC meds more expensive for her that RX's.  I told her this was not a prescription dose and she asked could you not call in the 50,000 like you did before. She said she has a "cash flow" problem right now and it would help if she could get an RX and just pay copymt.    She is dealing with a dying Aunt who is in Hospice care in [place] and she will address her lipid profile results when she returns and things settle. She said she is just going to contact her primary care MD and go there and let them repeat in so they can prescribe if she needs medication.

## 2011-12-08 NOTE — Telephone Encounter (Signed)
Message copied by Keenan Bachelor on Thu Dec 08, 2011  2:13 PM ------      Message from: Dara Lords      Created: Mon Nov 28, 2011  9:30 AM       Tell patient      #1  Her vitamin D level is low. Recommend 2000 units OTC daily recheck level in 3 months.      #2  Her cholesterol, LDL triglycerides and VLDL are all elevated. If this was not a fasting value then I recommend repeating a fasting lipid profile. If it was a fasting lipid profile and she will need to follow up with her primary physician to discuss possible treatment.

## 2011-12-09 NOTE — Telephone Encounter (Signed)
Patient informed. Recall put in system. 

## 2012-03-23 ENCOUNTER — Encounter: Payer: Self-pay | Admitting: Gynecology

## 2012-03-23 ENCOUNTER — Ambulatory Visit (INDEPENDENT_AMBULATORY_CARE_PROVIDER_SITE_OTHER): Payer: Managed Care, Other (non HMO) | Admitting: Gynecology

## 2012-03-23 DIAGNOSIS — N9489 Other specified conditions associated with female genital organs and menstrual cycle: Secondary | ICD-10-CM

## 2012-03-23 DIAGNOSIS — N898 Other specified noninflammatory disorders of vagina: Secondary | ICD-10-CM

## 2012-03-23 DIAGNOSIS — Z113 Encounter for screening for infections with a predominantly sexual mode of transmission: Secondary | ICD-10-CM

## 2012-03-23 DIAGNOSIS — N390 Urinary tract infection, site not specified: Secondary | ICD-10-CM

## 2012-03-23 DIAGNOSIS — R102 Pelvic and perineal pain: Secondary | ICD-10-CM

## 2012-03-23 LAB — WET PREP FOR TRICH, YEAST, CLUE
Clue Cells Wet Prep HPF POC: NONE SEEN
Trich, Wet Prep: NONE SEEN

## 2012-03-23 LAB — URINALYSIS W MICROSCOPIC + REFLEX CULTURE
Crystals: NONE SEEN
Protein, ur: 30 mg/dL — AB
Specific Gravity, Urine: >1.03 — ABNORMAL HIGH (ref 1.005–1.030)
Urobilinogen, UA: 1 mg/dL (ref 0.0–1.0)

## 2012-03-23 MED ORDER — CLINDAMYCIN PHOSPHATE 2 % VA CREA: 1.0000 | TOPICAL_CREAM | Freq: Every day | VAGINAL | Status: AC

## 2012-03-23 MED ORDER — NITROFURANTOIN MONOHYD MACRO 100 MG PO CAPS: 100.0000 mg | ORAL_CAPSULE | Freq: Two times a day (BID) | ORAL | Status: AC

## 2012-03-23 NOTE — Patient Instructions (Signed)
Take Macrodantin antibiotic twice daily for one week. Use vaginal cream nightly for one week. Follow up if vaginal or bladder symptoms persist.

## 2012-03-23 NOTE — Progress Notes (Signed)
Patient presents having recently had unprotected intercourse now with vaginal discharge, odor, suprapubic pressure and discomfort.  No fever chills nausea vomiting diarrhea constipation or low back pain. Is status post TAH for leiomyoma.  Exam the skin assistant Spine straight no CVA tenderness. Abdomen: Soft nontender masses guarding rebound organomegaly. Pelvic external BUS vagina with frothy yellow discharge. Bimanual without masses or tenderness.  Assessment and plan: 1. Vaginal discharge. Recent unprotected intercourse. Wet prep suspicious for bacterial vaginosis. We'll treat with Cleocin vaginal cream nightly at her choice. GC Chlamydia vaginal/urethral screen taken. 2. UTI. Symptoms and urinalysis consistent with UTI. Treat with Macrobid 100 mg twice a day x7 days. 3. History of hot flushes. Patient had been given trial of minivelle 0.0375 but she decided to stop these. She is on Lexapro for anxiety and notes that her hot flashes have improved on this and does not want ERT.

## 2012-03-24 LAB — GC/CHLAMYDIA PROBE AMP, GENITAL
Chlamydia, DNA Probe: NEGATIVE
GC Probe Amp, Genital: NEGATIVE

## 2012-03-27 ENCOUNTER — Ambulatory Visit: Payer: Managed Care, Other (non HMO) | Admitting: Gynecology

## 2012-03-27 ENCOUNTER — Other Ambulatory Visit: Payer: Self-pay | Admitting: Gynecology

## 2012-03-27 DIAGNOSIS — N390 Urinary tract infection, site not specified: Secondary | ICD-10-CM

## 2012-04-04 ENCOUNTER — Other Ambulatory Visit: Payer: Managed Care, Other (non HMO)

## 2012-04-04 DIAGNOSIS — N390 Urinary tract infection, site not specified: Secondary | ICD-10-CM

## 2012-04-05 LAB — URINALYSIS W MICROSCOPIC + REFLEX CULTURE
Bacteria, UA: NONE SEEN
Bilirubin Urine: NEGATIVE
Glucose, UA: NEGATIVE mg/dL
Hgb urine dipstick: NEGATIVE
Ketones, ur: NEGATIVE mg/dL
Protein, ur: NEGATIVE mg/dL

## 2012-04-06 LAB — URINE CULTURE

## 2012-06-26 ENCOUNTER — Other Ambulatory Visit: Payer: Self-pay | Admitting: *Deleted

## 2012-06-26 MED ORDER — LORAZEPAM 0.5 MG PO TABS: 0.5000 mg | ORAL_TABLET | Freq: Three times a day (TID) | ORAL | Status: DC

## 2012-07-16 ENCOUNTER — Other Ambulatory Visit (HOSPITAL_COMMUNITY)
Admission: RE | Admit: 2012-07-16 | Discharge: 2012-07-16 | Disposition: A | Discharge status: Home / Self Care | Payer: Managed Care, Other (non HMO) | Source: Ambulatory Visit | Attending: Gynecology | Admitting: Gynecology

## 2012-07-16 ENCOUNTER — Encounter: Payer: Self-pay | Admitting: Gynecology

## 2012-07-16 ENCOUNTER — Ambulatory Visit (INDEPENDENT_AMBULATORY_CARE_PROVIDER_SITE_OTHER): Payer: Managed Care, Other (non HMO) | Admitting: Gynecology

## 2012-07-16 VITALS — BP 140/88

## 2012-07-16 DIAGNOSIS — Z01419 Encounter for gynecological examination (general) (routine) without abnormal findings: Secondary | ICD-10-CM | POA: Insufficient documentation

## 2012-07-16 DIAGNOSIS — Z1151 Encounter for screening for human papillomavirus (HPV): Secondary | ICD-10-CM | POA: Insufficient documentation

## 2012-07-16 DIAGNOSIS — N898 Other specified noninflammatory disorders of vagina: Secondary | ICD-10-CM

## 2012-07-16 DIAGNOSIS — R6889 Other general symptoms and signs: Secondary | ICD-10-CM

## 2012-07-16 DIAGNOSIS — IMO0001 Reserved for inherently not codable concepts without codable children: Secondary | ICD-10-CM

## 2012-07-16 LAB — WET PREP FOR TRICH, YEAST, CLUE: Yeast Wet Prep HPF POC: NONE SEEN

## 2012-07-16 MED ORDER — METRONIDAZOLE 0.75 % VA GEL: 1.0000 | Freq: Two times a day (BID) | VAGINAL | Status: DC

## 2012-07-16 NOTE — Patient Instructions (Signed)
Follow up if vaginal discharge continues or recurs. Use the vaginal cream twice daily for 5 days. It may cause some sensitivity to alcohol.

## 2012-07-16 NOTE — Addendum Note (Signed)
Addended by: Bertram Savin A on: 07/16/2012 02:07 PM   Modules accepted: Orders

## 2012-07-16 NOTE — Progress Notes (Signed)
Patient presents for 2 issues: 1. Pap smear. History of VAIN 04 November 2010. Colposcopy was negative and no biopsies were taken. Follow up Pap smear March 2013 showed ASCUS negative high risk HPV. Here for 6 month follow up Pap smear 2. Persistent vaginal discharge. Was treated for bacterial vaginosis in July with Cleocin vaginal cream. States that it did not get better. Has not been sexually active.  Exam with [facility] assistant Pelvic external BUS vagina with white frothy discharge. Wet prep done. Pap/HPV done. Bimanual without masses or tenderness.  Assessment and plan: 1. History of VAIN 04 November 2010 with colposcopy negative. Pap smear March 2013 with ascus negative high-risk HPV. Patient will follow up for Pap smear results today. 2. Symptoms and wet prep consistent with bacterial vaginosis. Offered Flagyl oral but she declined because of not being able to drink alcohol. Suggested MetroGel vaginal twice daily for 5 days. Alcohol sensitivity reviewed. Follow up as needed otherwise April 2014 for annual exam

## 2012-09-21 ENCOUNTER — Telehealth: Payer: Self-pay | Admitting: *Deleted

## 2012-09-21 MED ORDER — METRONIDAZOLE 0.75 % VA GEL: 1.0000 | Freq: Two times a day (BID) | VAGINAL | Status: DC

## 2012-09-21 NOTE — Telephone Encounter (Signed)
MetroGel refill okay, office visit if symptoms persist

## 2012-09-21 NOTE — Telephone Encounter (Signed)
Pt was given Metrogel on OV 07/16/12. Pt didn't complete the full 5 day dose of Rx,now c/o odor and frequent urination, no burning with urination. I offered OV to patient and she declined, she is currently on her way out of town to see her brother who is on life support and unable to come in She asked if Rx could be sent? Please advise

## 2012-09-21 NOTE — Telephone Encounter (Signed)
Left message on pt voicemail  Rx has been sent and f/u of symptoms persist.

## 2012-10-03 ENCOUNTER — Ambulatory Visit: Payer: Managed Care, Other (non HMO) | Admitting: Gynecology

## 2012-10-11 ENCOUNTER — Ambulatory Visit (INDEPENDENT_AMBULATORY_CARE_PROVIDER_SITE_OTHER): Payer: Managed Care, Other (non HMO) | Admitting: Gynecology

## 2012-10-11 ENCOUNTER — Encounter: Payer: Self-pay | Admitting: Gynecology

## 2012-10-11 DIAGNOSIS — A5901 Trichomonal vulvovaginitis: Secondary | ICD-10-CM

## 2012-10-11 DIAGNOSIS — L293 Anogenital pruritus, unspecified: Secondary | ICD-10-CM

## 2012-10-11 DIAGNOSIS — L292 Pruritus vulvae: Secondary | ICD-10-CM

## 2012-10-11 DIAGNOSIS — R350 Frequency of micturition: Secondary | ICD-10-CM

## 2012-10-11 LAB — URINALYSIS W MICROSCOPIC + REFLEX CULTURE
Bilirubin Urine: NEGATIVE
Glucose, UA: NEGATIVE mg/dL
Protein, ur: 30 mg/dL — AB
Urobilinogen, UA: 0.2 mg/dL (ref 0.0–1.0)

## 2012-10-11 LAB — WET PREP FOR TRICH, YEAST, CLUE

## 2012-10-11 MED ORDER — METRONIDAZOLE 500 MG PO TABS: ORAL_TABLET | ORAL | Status: DC

## 2012-10-11 MED ORDER — SULFAMETHOXAZOLE-TMP DS 800-160 MG PO TABS: 1.0000 | ORAL_TABLET | Freq: Two times a day (BID) | ORAL | Status: DC

## 2012-10-11 NOTE — Patient Instructions (Signed)
Take Septra antibiotic twice daily for 3 days. Take metronidazole antibiotic 4 pills at one time. Follow up if discharge, vaginal irritation or urinary symptoms persist, worsen or change. Follow up in one month for recheck.

## 2012-10-11 NOTE — Addendum Note (Signed)
Addended by: Dara Lords on: 10/11/2012 04:58 PM   Modules accepted: Orders

## 2012-10-11 NOTE — Progress Notes (Addendum)
Patient presents for test of cure. She had a vaginal discharge and was treated as bacterial vaginosis with MetroGel and had a Pap smear which was normal cytology did show trichomonas. We talked about Flagyl but she was not interested in taking it at the time and used the MetroGel recognizing it was not as good a treatment and is here for test of cure. She still is having some vaginal irritation some dysuria and frequency.  Exam with Selena Batten assistant Pelvic external BUS vagina with frothy whitish discharge. Bimanual without masses or tenderness  Assessment and plan: Wet prep shows trichomonas. Urinalysis consistent with UTI. We'll treat with Septra DS 1 by mouth twice a day x3 days and Flagyl 2 g dose. Alcohol avoidance 24 hours discussed. Patient wants to be checked again to make sure it's gone a gastric about a month or so.  We'll also check a GC and Chlamydia on her urine specimen.

## 2012-10-12 LAB — GC/CHLAMYDIA PROBE AMP, URINE
Chlamydia, Swab/Urine, PCR: NEGATIVE
GC Probe Amp, Urine: NEGATIVE

## 2012-10-13 LAB — URINE CULTURE

## 2012-12-13 ENCOUNTER — Other Ambulatory Visit (HOSPITAL_COMMUNITY)
Admission: RE | Admit: 2012-12-13 | Discharge: 2012-12-13 | Disposition: A | Discharge status: Home / Self Care | Payer: Managed Care, Other (non HMO) | Source: Ambulatory Visit | Attending: Gynecology | Admitting: Gynecology

## 2012-12-13 ENCOUNTER — Ambulatory Visit (INDEPENDENT_AMBULATORY_CARE_PROVIDER_SITE_OTHER): Payer: Managed Care, Other (non HMO) | Admitting: Gynecology

## 2012-12-13 ENCOUNTER — Encounter: Payer: Self-pay | Admitting: Gynecology

## 2012-12-13 VITALS — BP 120/74 | Ht 66.5 in | Wt 196.0 lb

## 2012-12-13 DIAGNOSIS — Z01419 Encounter for gynecological examination (general) (routine) without abnormal findings: Secondary | ICD-10-CM

## 2012-12-13 DIAGNOSIS — R350 Frequency of micturition: Secondary | ICD-10-CM

## 2012-12-13 DIAGNOSIS — M8580 Other specified disorders of bone density and structure, unspecified site: Secondary | ICD-10-CM

## 2012-12-13 DIAGNOSIS — M62838 Other muscle spasm: Secondary | ICD-10-CM

## 2012-12-13 DIAGNOSIS — M949 Disorder of cartilage, unspecified: Secondary | ICD-10-CM

## 2012-12-13 DIAGNOSIS — M899 Disorder of bone, unspecified: Secondary | ICD-10-CM

## 2012-12-13 DIAGNOSIS — A5901 Trichomonal vulvovaginitis: Secondary | ICD-10-CM

## 2012-12-13 DIAGNOSIS — N898 Other specified noninflammatory disorders of vagina: Secondary | ICD-10-CM

## 2012-12-13 LAB — URINALYSIS W MICROSCOPIC + REFLEX CULTURE
Casts: NONE SEEN
RBC / HPF: NONE SEEN RBC/hpf (ref <3)
Specific Gravity, Urine: >1.03 — ABNORMAL HIGH (ref 1.005–1.030)
pH: 5.5 (ref 5.0–8.0)

## 2012-12-13 LAB — WET PREP FOR TRICH, YEAST, CLUE

## 2012-12-13 MED ORDER — METRONIDAZOLE 500 MG PO TABS: 500.0000 mg | ORAL_TABLET | Freq: Two times a day (BID) | ORAL | Status: DC

## 2012-12-13 MED ORDER — ESCITALOPRAM OXALATE 5 MG PO TABS: 10.0000 mg | ORAL_TABLET | Freq: Every day | ORAL | Status: DC

## 2012-12-13 NOTE — Progress Notes (Signed)
Tracey [place] 1955/12/16 [id number]        57 y.o.  G0P0 for annual exam.  Several issues noted below.  Past medical history,surgical history, medications, allergies, family history and social history were all reviewed and documented in the EPIC chart. ROS:  Was performed and pertinent positives and negatives are included in the history.  Exam: Kim assistant Filed Vitals:   12/13/12 1540  BP: 120/74  Height: 5' 6.5" (1.689 m)  Weight: 196 lb (88.905 kg)   General appearance  Normal Skin grossly normal Head/Neck normal with no cervical or supraclavicular adenopathy thyroid normal Lungs  clear Cardiac RR, without RMG Abdominal  soft, nontender, without masses, organomegaly or hernia Breasts  examined lying and sitting without masses, retractions, discharge or axillary adenopathy. Pelvic  Ext/BUS/vagina  normal with slight discharge.  Adnexa  Without masses or tenderness    Anus and perineum  normal   Rectovaginal  normal sphincter tone without palpated masses or tenderness.    Assessment/Plan:  57 y.o. G0P0 female for annual exam.   1. Trichomonas vaginitis. Wet prep done due to slight discharge and history of recent treatment which showed persistence of her Trichomonas. She was given a 2 day course of Flagyl previously. Has not had contact with that partner since treatment.  We'll treat her now with Flagyl 500 mg twice a day x7 days. Followup if symptoms persist or recur. Was cultured for GC chlamydia which was negative previously and she reports not having any sexual contact since that screening and we did not repeat that now. 2. VAIN 1.  History of VAIN 1 of the vaginal cuff March 2012.  Colposcopy was negative with no biopsies.  followup Pap smear March 2013 showed ASCUS negative high-risk HPV. Pap smear November 2013 was normal. Pap done today. Followup for results. 3. Osteopenia.  DEXA 2012 showed T score -1.3. Recommend repeat DEXA now. Patient agrees to schedule but will not  do this today and said that she will call back when she checks her schedule at work. Importance to do so stressed. Increase calcium vitamin D reviewed. 4. Mammography 2010. Need to schedule now stressed. Patient agrees to do so I gave her names of facilities in [place] to call. SBE monthly reviewed. 5. Colonoscopy due now. Patient agrees to schedule I gave her names to call and schedule. 6. Stressed. Patient is having a lot of stress at work. She is on Lexapro 5 mg. Also having muscle spasms in her neck related to the stress.  Recommended increasing her Lexapro to 10 mg and see how she does with this I wrote for this prescription. 7. Health maintenance. Patient is being followed for hypertension. Will follow up with her primary to have routine blood work. Apparently they want her to do fasting values and she's having problems arranging this I stressed the need to do so.    Dara Lords MD, 4:50 PM 12/13/2012

## 2012-12-13 NOTE — Patient Instructions (Addendum)
Call to Schedule your mammogram  Facilities in [place]: 1)  [facility] [place], [place] Phone: [phone]  [facility], [address] [address] Phone: [phone])  Dr. Yolanda Bonine at [facility] [address] Phone: [phone]     Mammogram A mammogram is an X-ray test to find changes in a woman's breast. You should get a mammogram if:  You are 58 years of age or older  You have risk factors.   Your doctor recommends that you have one.  BEFORE THE TEST  Do not schedule the test the week before your period, especially if your breasts are sore during this time.  On the day of your mammogram:  Wash your breasts and armpits well. After washing, do not put on any deodorant or talcum powder on until after your test.   Eat and drink as you usually do.   Take your medicines as usual.   If you are diabetic and take insulin, make sure you:   Eat before coming for your test.   Take your insulin as usual.   If you cannot keep your appointment, call before the appointment to cancel. Schedule another appointment.  TEST  You will need to undress from the waist up. You will put on a hospital gown.   Your breast will be put on the mammogram machine, and it will press firmly on your breast with a piece of plastic called a compression paddle. This will make your breast flatter so that the machine can X-ray all parts of your breast.   Both breasts will be X-rayed. Each breast will be X-rayed from above and from the side. An X-ray might need to be taken again if the picture is not good enough.   The mammogram will last about 15 to 30 minutes.  AFTER THE TEST Finding out the results of your test Ask when your test results will be ready. Make sure you get your test results.  Document Released: 11/18/2008 Document Revised: 08/11/2011 Document Reviewed: 11/18/2008 [facility] Patient  Information 2012 [place], [place].    Schedule Colonoscopy at [facility]

## 2012-12-15 LAB — URINE CULTURE

## 2013-04-17 ENCOUNTER — Encounter: Payer: Self-pay | Admitting: Gynecology

## 2013-04-17 ENCOUNTER — Ambulatory Visit (INDEPENDENT_AMBULATORY_CARE_PROVIDER_SITE_OTHER): Payer: Managed Care, Other (non HMO) | Admitting: Gynecology

## 2013-04-17 DIAGNOSIS — Z202 Contact with and (suspected) exposure to infections with a predominantly sexual mode of transmission: Secondary | ICD-10-CM

## 2013-04-17 DIAGNOSIS — F411 Generalized anxiety disorder: Secondary | ICD-10-CM

## 2013-04-17 DIAGNOSIS — R21 Rash and other nonspecific skin eruption: Secondary | ICD-10-CM

## 2013-04-17 LAB — WET PREP FOR TRICH, YEAST, CLUE
Clue Cells Wet Prep HPF POC: NONE SEEN
Trich, Wet Prep: NONE SEEN

## 2013-04-17 MED ORDER — FLUCONAZOLE 200 MG PO TABS: 200.0000 mg | ORAL_TABLET | Freq: Every day | ORAL | Status: DC

## 2013-04-17 MED ORDER — PREDNISONE (PAK) 10 MG PO TABS: ORAL_TABLET | ORAL | Status: DC

## 2013-04-17 MED ORDER — LORAZEPAM 0.5 MG PO TABS: 0.5000 mg | ORAL_TABLET | Freq: Three times a day (TID) | ORAL | Status: DC

## 2013-04-17 NOTE — Patient Instructions (Signed)
Take Diflucan pills daily for 5 days. Use the prednisone taper over 7 days. Followup with dermatology if rash continues. Followup with me routinely when due for annual exam.

## 2013-04-17 NOTE — Progress Notes (Signed)
Patient presents complaining of a rash around her scalp region. If all is having diet applied to that area of her hair. Also notes though subsequently developed itching with a rash under both breasts and along her groin area bilaterally.  Patient also had been treated for Trichomonas and asked for a test of cure wet prep. Has not been sexual active since her treatment. Is not having any complaints at discharge odor or other symptoms.  Exam was Berenice Bouton Papular rash along the left temporal scalp line. Under both breasts a fine papular rash. Both groin areas inspected without evidence of rash. Pelvic external BUS vagina normal. Wet prep done.  Assessment and plan: 1. Rash probable allergic in the scalp line. Suspect fungal under breasts and in the groin. Will cover with Diflucan 200 mg daily x5 days and prednisone taper with 40 mg/30 mg/20 mg/10 mg x4 days. Followup with dermatology if rash persists. 2. Wet prep test of cure for Trichomonas was negative. Had been screened for [facility] and chlamydia previously which were negative. 3. Anxiety. Patient's on lorazepam prescribed by another physician and asked if I would refill this for her given a lot of recent stress with ailing parents and work. Lorazepam 0.5 mg #30 with 2 refills provided.

## 2013-04-18 LAB — URINALYSIS W MICROSCOPIC + REFLEX CULTURE
Bacteria, UA: NONE SEEN
Bilirubin Urine: NEGATIVE
Crystals: NONE SEEN
Ketones, ur: NEGATIVE mg/dL
Nitrite: NEGATIVE
Protein, ur: NEGATIVE mg/dL
Specific Gravity, Urine: 1.019 (ref 1.005–1.030)
Urobilinogen, UA: 0.2 mg/dL (ref 0.0–1.0)

## 2013-05-09 ENCOUNTER — Ambulatory Visit
Admission: RE | Admit: 2013-05-09 | Discharge: 2013-05-09 | Disposition: A | Discharge status: Home / Self Care | Payer: Managed Care, Other (non HMO) | Source: Ambulatory Visit | Attending: Physician Assistant | Admitting: Physician Assistant

## 2013-05-09 ENCOUNTER — Other Ambulatory Visit: Payer: Self-pay | Admitting: Physician Assistant

## 2013-05-09 DIAGNOSIS — R079 Chest pain, unspecified: Secondary | ICD-10-CM

## 2013-10-24 ENCOUNTER — Telehealth: Payer: Self-pay | Admitting: *Deleted

## 2013-10-24 NOTE — Telephone Encounter (Signed)
Pt called diaper rash wears depends at times due to work schedule sitting for long periods of time. Pt said she has not had sex in over a year, no vaginal issues just diaper rash and would like a Rx to help with this. Pt has tried OTC but no relief. Please advise

## 2013-10-24 NOTE — Telephone Encounter (Signed)
Mytrex cream twice a day application dispense one tube with 2 refills

## 2013-10-25 MED ORDER — NYSTATIN-TRIAMCINOLONE 100000-0.1 UNIT/GM-% EX CREA: 1.0000 "application " | TOPICAL_CREAM | Freq: Two times a day (BID) | CUTANEOUS | Status: DC

## 2013-10-25 NOTE — Telephone Encounter (Signed)
Pt informed with the below note, rx sent. 

## 2014-02-17 ENCOUNTER — Ambulatory Visit
Admission: RE | Admit: 2014-02-17 | Discharge: 2014-02-17 | Disposition: A | Discharge status: Home / Self Care | Payer: Managed Care, Other (non HMO) | Source: Ambulatory Visit | Attending: Family Medicine | Admitting: Family Medicine

## 2014-02-17 ENCOUNTER — Other Ambulatory Visit: Payer: Self-pay | Admitting: Family Medicine

## 2014-02-17 DIAGNOSIS — J209 Acute bronchitis, unspecified: Secondary | ICD-10-CM

## 2014-03-12 ENCOUNTER — Encounter: Payer: Self-pay | Admitting: Gynecology

## 2014-03-12 ENCOUNTER — Ambulatory Visit (INDEPENDENT_AMBULATORY_CARE_PROVIDER_SITE_OTHER): Payer: Managed Care, Other (non HMO) | Admitting: Gynecology

## 2014-03-12 ENCOUNTER — Telehealth: Payer: Self-pay | Admitting: *Deleted

## 2014-03-12 DIAGNOSIS — N644 Mastodynia: Secondary | ICD-10-CM

## 2014-03-12 NOTE — Telephone Encounter (Signed)
Message copied by Thamas Jaegers on Wed Mar 12, 2014  4:35 PM ------      Message from: Anastasio Auerbach      Created: Wed Mar 12, 2014  4:19 PM       Schedule bilateral diagnostic mammography, reference bilateral mastalgia, no palpable abnormalities. Patient's preference is late afternoon Fridays, if possible. ------

## 2014-03-12 NOTE — Progress Notes (Signed)
Tracey [place] 04/27/1956 [id number]        58 y.o.  G0P0 presents with bilateral breast tenderness over the past several weeks. Saw her primary who suggested decreasing caffeine and she notes that her tenderness seems to be getting better. No palpable masses or nipple discharge. Last mammogram 2010.  Past medical history,surgical history, problem list, medications, allergies, family history and social history were all reviewed and documented in the EPIC chart.  Directed ROS with pertinent positives and negatives documented in the history of present illness/assessment and plan.  Exam: Kim assistant General appearance:  Normal Both breasts examined lying and sitting. No masses, retractions, discharge, adenopathy.  Assessment/Plan:  58 y.o. G0P0  with resolving bilateral diffuse mastalgia. Exam is normal. Recommend baseline diagnostic mammography now. Office will help to arrange and she knows the importance of following up for this.   Note: This document was prepared with digital dictation and possible smart phrase technology. Any transcriptional errors that result from this process are unintentional.   Anastasio Auerbach MD, 4:17 PM 03/12/2014

## 2014-03-12 NOTE — Patient Instructions (Signed)
Office will call you to arrange mammogram.

## 2014-03-12 NOTE — Telephone Encounter (Signed)
Order placed at breast center, they will contact patient to schedule.

## 2014-03-13 ENCOUNTER — Encounter: Payer: Self-pay | Admitting: Gynecology

## 2014-03-19 NOTE — Telephone Encounter (Signed)
Breast center left message for pt to call 03/18/14

## 2014-03-24 NOTE — Telephone Encounter (Signed)
I spoke with patient and she is going to call breast center.

## 2014-03-25 NOTE — Telephone Encounter (Signed)
Appointment 04/04/14 at breast center

## 2014-04-04 ENCOUNTER — Ambulatory Visit
Admission: RE | Admit: 2014-04-04 | Discharge: 2014-04-04 | Disposition: A | Discharge status: Home / Self Care | Payer: Managed Care, Other (non HMO) | Source: Ambulatory Visit | Attending: Gynecology | Admitting: Gynecology

## 2014-04-04 DIAGNOSIS — N644 Mastodynia: Secondary | ICD-10-CM

## 2014-04-05 ENCOUNTER — Encounter: Payer: Self-pay | Admitting: *Deleted

## 2014-04-09 ENCOUNTER — Encounter: Payer: Managed Care, Other (non HMO) | Admitting: Gynecology

## 2014-06-03 ENCOUNTER — Encounter: Payer: Self-pay | Admitting: Gynecology

## 2014-06-03 ENCOUNTER — Ambulatory Visit (INDEPENDENT_AMBULATORY_CARE_PROVIDER_SITE_OTHER): Payer: Managed Care, Other (non HMO) | Admitting: Gynecology

## 2014-06-03 ENCOUNTER — Other Ambulatory Visit (HOSPITAL_COMMUNITY)
Admission: RE | Admit: 2014-06-03 | Discharge: 2014-06-03 | Disposition: A | Discharge status: Home / Self Care | Payer: Managed Care, Other (non HMO) | Source: Ambulatory Visit | Attending: Gynecology | Admitting: Gynecology

## 2014-06-03 VITALS — BP 120/74 | Ht 66.0 in | Wt 191.0 lb

## 2014-06-03 DIAGNOSIS — Z01419 Encounter for gynecological examination (general) (routine) without abnormal findings: Secondary | ICD-10-CM | POA: Insufficient documentation

## 2014-06-03 DIAGNOSIS — M8580 Other specified disorders of bone density and structure, unspecified site: Secondary | ICD-10-CM

## 2014-06-03 DIAGNOSIS — M949 Disorder of cartilage, unspecified: Secondary | ICD-10-CM

## 2014-06-03 DIAGNOSIS — M899 Disorder of bone, unspecified: Secondary | ICD-10-CM

## 2014-06-03 NOTE — Patient Instructions (Addendum)
Follow up for your mammogram studies in 4 months (6 months from your last study is recommended by the radiologist)  Schedule colonoscopy with [facility] gastroenterology at [phone] or [facility] gastroenterology at [phone]  Follow up for bone density as scheduled.  You may obtain a copy of any labs that were done today by logging onto MyChart as outlined in the instructions provided with your AVS (after visit summary). The office will not call with normal lab results but certainly if there are any significant abnormalities then we will contact you.   Health Maintenance, Female A healthy lifestyle and preventative care can promote health and wellness.  Maintain regular health, dental, and eye exams.  Eat a healthy diet. Foods like vegetables, fruits, whole grains, low-fat dairy products, and lean protein foods contain the nutrients you need without too many calories. Decrease your intake of foods high in solid fats, added sugars, and salt. Get information about a proper diet from your caregiver, if necessary.  Regular physical exercise is one of the most important things you can do for your health. Most adults should get at least 150 minutes of moderate-intensity exercise (any activity that increases your heart rate and causes you to sweat) each week. In addition, most adults need muscle-strengthening exercises on 2 or more days a week.   Maintain a healthy weight. The body mass index (BMI) is a screening tool to identify possible weight problems. It provides an estimate of body fat based on height and weight. Your caregiver can help determine your BMI, and can help you achieve or maintain a healthy weight. For adults 20 years and older:  A BMI below 18.5 is considered underweight.  A BMI of 18.5 to 24.9 is normal.  A BMI of 25 to 29.9 is considered overweight.  A BMI of 30 and above is considered obese.  Maintain normal blood lipids and cholesterol by exercising and minimizing your  intake of saturated fat. Eat a balanced diet with plenty of fruits and vegetables. Blood tests for lipids and cholesterol should begin at age 38 and be repeated every 5 years. If your lipid or cholesterol levels are high, you are over 50, or you are a high risk for heart disease, you may need your cholesterol levels checked more frequently.Ongoing high lipid and cholesterol levels should be treated with medicines if diet and exercise are not effective.  If you smoke, find out from your caregiver how to quit. If you do not use tobacco, do not start.  Lung cancer screening is recommended for adults aged 51 80 years who are at high risk for developing lung cancer because of a history of smoking. Yearly low-dose computed tomography (CT) is recommended for people who have at least a 30-pack-year history of smoking and are a current smoker or have quit within the past 15 years. A pack year of smoking is smoking an average of 1 pack of cigarettes a day for 1 year (for example: 1 pack a day for 30 years or 2 packs a day for 15 years). Yearly screening should continue until the smoker has stopped smoking for at least 15 years. Yearly screening should also be stopped for people who develop a health problem that would prevent them from having lung cancer treatment.  If you are pregnant, do not drink alcohol. If you are breastfeeding, be very cautious about drinking alcohol. If you are not pregnant and choose to drink alcohol, do not exceed 1 drink per day. One drink is considered to be  12 ounces (355 mL) of beer, 5 ounces (148 mL) of wine, or 1.5 ounces (44 mL) of liquor.  Avoid use of street drugs. Do not share needles with anyone. Ask for help if you need support or instructions about stopping the use of drugs.  High blood pressure causes heart disease and increases the risk of stroke. Blood pressure should be checked at least every 1 to 2 years. Ongoing high blood pressure should be treated with medicines, if  weight loss and exercise are not effective.  If you are 67 to 59 years old, ask your caregiver if you should take aspirin to prevent strokes.  Diabetes screening involves taking a blood sample to check your fasting blood sugar level. This should be done once every 3 years, after age 21, if you are within normal weight and without risk factors for diabetes. Testing should be considered at a younger age or be carried out more frequently if you are overweight and have at least 1 risk factor for diabetes.  Breast cancer screening is essential preventative care for women. You should practice "breast self-awareness." This means understanding the normal appearance and feel of your breasts and may include breast self-examination. Any changes detected, no matter how small, should be reported to a caregiver. Women in their 51s and 30s should have a clinical breast exam (CBE) by a caregiver as part of a regular health exam every 1 to 3 years. After age 10, women should have a CBE every year. Starting at age 44, women should consider having a mammogram (breast X-ray) every year. Women who have a family history of breast cancer should talk to their caregiver about genetic screening. Women at a high risk of breast cancer should talk to their caregiver about having an MRI and a mammogram every year.  Breast cancer gene (BRCA)-related cancer risk assessment is recommended for women who have family members with BRCA-related cancers. BRCA-related cancers include breast, ovarian, tubal, and peritoneal cancers. Having family members with these cancers may be associated with an increased risk for harmful changes (mutations) in the breast cancer genes BRCA1 and BRCA2. Results of the assessment will determine the need for genetic counseling and BRCA1 and BRCA2 testing.  The Pap test is a screening test for cervical cancer. Women should have a Pap test starting at age 56. Between ages 60 and 27, Pap tests should be repeated every  2 years. Beginning at age 20, you should have a Pap test every 3 years as long as the past 3 Pap tests have been normal. If you had a hysterectomy for a problem that was not cancer or a condition that could lead to cancer, then you no longer need Pap tests. If you are between ages 23 and 58, and you have had normal Pap tests going back 10 years, you no longer need Pap tests. If you have had past treatment for cervical cancer or a condition that could lead to cancer, you need Pap tests and screening for cancer for at least 20 years after your treatment. If Pap tests have been discontinued, risk factors (such as a new sexual partner) need to be reassessed to determine if screening should be resumed. Some women have medical problems that increase the chance of getting cervical cancer. In these cases, your caregiver may recommend more frequent screening and Pap tests.  The human papillomavirus (HPV) test is an additional test that may be used for cervical cancer screening. The HPV test looks for the virus that can  cause the cell changes on the cervix. The cells collected during the Pap test can be tested for HPV. The HPV test could be used to screen women aged 46 years and older, and should be used in women of any age who have unclear Pap test results. After the age of 14, women should have HPV testing at the same frequency as a Pap test.  Colorectal cancer can be detected and often prevented. Most routine colorectal cancer screening begins at the age of 39 and continues through age 37. However, your caregiver may recommend screening at an earlier age if you have risk factors for colon cancer. On a yearly basis, your caregiver may provide home test kits to check for hidden blood in the stool. Use of a small camera at the end of a tube, to directly examine the colon (sigmoidoscopy or colonoscopy), can detect the earliest forms of colorectal cancer. Talk to your caregiver about this at age 39, when routine screening  begins. Direct examination of the colon should be repeated every 5 to 10 years through age 60, unless early forms of pre-cancerous polyps or small growths are found.  Hepatitis C blood testing is recommended for all people born from [birth year] through 1966 and any individual with known risks for hepatitis C.  Practice safe sex. Use condoms and avoid high-risk sexual practices to reduce the spread of sexually transmitted infections (STIs). Sexually active women aged 23 and younger should be checked for Chlamydia, which is a common sexually transmitted infection. Older women with new or multiple partners should also be tested for Chlamydia. Testing for other STIs is recommended if you are sexually active and at increased risk.  Osteoporosis is a disease in which the bones lose minerals and strength with aging. This can result in serious bone fractures. The risk of osteoporosis can be identified using a bone density scan. Women ages 73 and over and women at risk for fractures or osteoporosis should discuss screening with their caregivers. Ask your caregiver whether you should be taking a calcium supplement or vitamin D to reduce the rate of osteoporosis.  Menopause can be associated with physical symptoms and risks. Hormone replacement therapy is available to decrease symptoms and risks. You should talk to your caregiver about whether hormone replacement therapy is right for you.  Use sunscreen. Apply sunscreen liberally and repeatedly throughout the day. You should seek shade when your shadow is shorter than you. Protect yourself by wearing long sleeves, pants, a wide-brimmed hat, and sunglasses year round, whenever you are outdoors.  Notify your caregiver of new moles or changes in moles, especially if there is a change in shape or color. Also notify your caregiver if a mole is larger than the size of a pencil eraser.  Stay current with your immunizations. Document Released: 03/07/2011 Document Revised:  12/17/2012 Document Reviewed: 03/07/2011 [facility] Patient Information 2014 [place].

## 2014-06-03 NOTE — Addendum Note (Signed)
Addended by: Nelva Nay on: 06/03/2014 04:52 PM   Modules accepted: Orders

## 2014-06-03 NOTE — Progress Notes (Signed)
Tracey [place] 1956/01/14 [id number]        58 y.o.  G0P0 for annual exam.  Several issues noted below.  Past medical history,surgical history, problem list, medications, allergies, family history and social history were all reviewed and documented as reviewed in the EPIC chart.  ROS:  12 system ROS performed with pertinent positives and negatives included in the history, assessment and plan.   Additional significant findings :  none   Exam: Kim Counsellor Vitals:   06/03/14 1608  BP: 120/74  Height: 5\' 6"  (1.676 m)  Weight: 191 lb (86.637 kg)   General appearance:  Normal affect, orientation and appearance. Skin: Grossly normal HEENT: Without gross lesions.  No cervical or supraclavicular adenopathy. Thyroid normal.  Lungs:  Clear without wheezing, rales or rhonchi Cardiac: RR, without RMG Abdominal:  Soft, nontender, without masses, guarding, rebound, organomegaly or hernia Breasts:  Examined lying and sitting without masses, retractions, discharge or axillary adenopathy. Pelvic:  Ext/BUS/vagina with mild atrophic changes. Pap smear of cuff done  Adnexa  Without masses or tenderness    Anus and perineum  Normal   Rectovaginal  Normal sphincter tone without palpated masses or tenderness.    Assessment/Plan:  58 y.o. G0P0 female for annual exam .   1. Postmenopausal/atrophic genital changes.  Status post TAH 2001 for leiomyoma. Without significant symptoms of hot flushes, night sweats or vaginal dryness. Will continue to monitor. 2. VAIN 1.  Pap of vaginal cuff done today.  History of VAIN 1 of the vaginal cuff March 2012.  Colposcopy was negative with no biopsies.  followup Pap smear March 2013 showed ASCUS negative high-risk HPV. Pap smears November 2013/2014 were normal. 3. Mammography 03/2014.  History of mastalgia bilateral. Recent mammogram showed circumscribed area or upper outer right breast and focal asymmetry of the glandular tissue. Felt to be probably benign but  recommended 6 month follow up. Her exam today is normal. Reminded her to follow up for this study and she agrees to do so. SBE monthly reviewed. 4. Osteopenia. DEXA 2012 with T score -1.3. Recommend DEXA now as I did last year but she did not follow up for this. Patient agrees to schedule. 5. Colonoscopy due now. I again recommended she schedule a screening colonoscopy as I did last year. Names and numbers provided and she agrees to do so. 6. Health maintenance. Patient reports routine blood work done through her primary physician who follows her for her hypertension.  Follow up in one year, sooner as needed.      Anastasio Auerbach MD, 4:41 PM 06/03/2014

## 2014-06-04 LAB — URINALYSIS W MICROSCOPIC + REFLEX CULTURE
BACTERIA UA: NONE SEEN
BILIRUBIN URINE: NEGATIVE
CASTS: NONE SEEN
Crystals: NONE SEEN
GLUCOSE, UA: NEGATIVE mg/dL
Hgb urine dipstick: NEGATIVE
KETONES UR: NEGATIVE mg/dL
Leukocytes, UA: NEGATIVE
Nitrite: NEGATIVE
PH: 8 (ref 5.0–8.0)
Protein, ur: NEGATIVE mg/dL
SPECIFIC GRAVITY, URINE: 1.02 (ref 1.005–1.030)
UROBILINOGEN UA: 1 mg/dL (ref 0.0–1.0)

## 2014-06-06 LAB — CYTOLOGY - PAP

## 2014-11-17 ENCOUNTER — Ambulatory Visit (INDEPENDENT_AMBULATORY_CARE_PROVIDER_SITE_OTHER): Payer: Managed Care, Other (non HMO) | Admitting: Gynecology

## 2014-11-17 ENCOUNTER — Encounter: Payer: Self-pay | Admitting: Gynecology

## 2014-11-17 VITALS — BP 134/84

## 2014-11-17 DIAGNOSIS — N644 Mastodynia: Secondary | ICD-10-CM

## 2014-11-17 MED ORDER — FLUOXETINE HCL 10 MG PO CAPS: 10.0000 mg | ORAL_CAPSULE | Freq: Every day | ORAL | Status: DC

## 2014-11-17 NOTE — Progress Notes (Signed)
Tracey Buchanan [birth year] [id number]        59 y.o.  G0P0 presents complaining of bilateral axillary discomfort.  Has noticed this over the last several months. Does have a history of bilateral mastalgia. Had mammogram/ultrasound in July with a recommended a 6 month follow up right mammogram due to an oval area right upper outer quadrant. Patient has not followed up for this yet. Does note now bilateral axillary discomfort comes and goes. No definitive masses nipple discharge or other symptoms.  Patient also having worsening anxiety. She is using lorazepam when necessary. Within the past year she's lost brother, nephew, Tracey Buchanan and now her father has metastatic prostate cancer. Is out on medical leave from her work because of her anxiety.  Past medical history,surgical history, problem list, medications, allergies, family history and social history were all reviewed and documented in the EPIC chart.  Directed ROS with pertinent positives and negatives documented in the history of present illness/assessment and plan.  Exam: Kim assistant Filed Vitals:   11/17/14 1509  BP: 134/84   General appearance:  Normal with appropriate affect and interaction. Both breasts examined lying and sitting without masses retractions discharge adenopathy.  Assessment/Plan:  59 y.o. G0P0 with bilateral axillary discomfort. Also bilateral mastalgia over the past several years.  Mammogram ultrasound showed an circumscribed mass upper outer right breast most probable fibroadenoma. They recommended a 6 month follow up.  We'll arrange for this as well as bilateral axillary ultrasound for completeness. Patient knows she will be contacted and to call the office if she does not hear within the next 2 weeks.  Anxiety, situational with different family members to include brother nephew pastor and father with metastatic prostate cancer. Uses lorazepam when necessary. Recommended a more consistent anxiolytic. Recommended trial  of Prozac 10 mg daily. We will start low as she is fearful of the side effect potential.  I did review the potential side effects and risks up to and including suicide ideation.  Patient will call me after one month of the medication in follow up we'll see how she is doing for dose adjustment.     Anastasio Auerbach MD, 3:41 PM 11/17/2014

## 2014-11-17 NOTE — Patient Instructions (Signed)
Office will call you to arrange mammogram/ultrasound  Start on the low dose Prozac daily. Call me if you have any issues with this. Call me at the end of the first month taking it regardless to see how you're doing.

## 2014-11-18 ENCOUNTER — Telehealth: Payer: Self-pay | Admitting: *Deleted

## 2014-11-18 DIAGNOSIS — Z09 Encounter for follow-up examination after completed treatment for conditions other than malignant neoplasm: Secondary | ICD-10-CM

## 2014-11-18 DIAGNOSIS — M79621 Pain in right upper arm: Secondary | ICD-10-CM

## 2014-11-18 DIAGNOSIS — M79622 Pain in left upper arm: Secondary | ICD-10-CM

## 2014-11-18 NOTE — Telephone Encounter (Signed)
-----   Message from Anastasio Auerbach, MD sent at 11/17/2014  3:46 PM EDT ----- Patient needs diagnostic mammogram and ultrasound follow up from her July mammogram as they recommended. Also she will need bilateral axillary ultrasounds reference bilateral axillary pain. Physical exam is normal.

## 2014-11-18 NOTE — Telephone Encounter (Signed)
Orders placed at breast center, they will contact pt to schedule.

## 2014-11-24 ENCOUNTER — Other Ambulatory Visit: Payer: Self-pay | Admitting: Gynecology

## 2014-11-24 DIAGNOSIS — M79629 Pain in unspecified upper arm: Secondary | ICD-10-CM

## 2014-11-24 DIAGNOSIS — D241 Benign neoplasm of right breast: Secondary | ICD-10-CM

## 2014-12-03 NOTE — Telephone Encounter (Signed)
Appointment 12/05/14 @ 4:00pm

## 2014-12-05 ENCOUNTER — Ambulatory Visit
Admission: RE | Admit: 2014-12-05 | Discharge: 2014-12-05 | Disposition: A | Discharge status: Home / Self Care | Payer: Managed Care, Other (non HMO) | Source: Ambulatory Visit | Attending: Gynecology | Admitting: Gynecology

## 2014-12-05 DIAGNOSIS — M79622 Pain in left upper arm: Secondary | ICD-10-CM

## 2014-12-05 DIAGNOSIS — M79629 Pain in unspecified upper arm: Secondary | ICD-10-CM

## 2014-12-05 DIAGNOSIS — D241 Benign neoplasm of right breast: Secondary | ICD-10-CM

## 2015-03-04 ENCOUNTER — Encounter: Payer: Self-pay | Admitting: Gynecology

## 2015-03-04 ENCOUNTER — Ambulatory Visit (INDEPENDENT_AMBULATORY_CARE_PROVIDER_SITE_OTHER): Payer: Managed Care, Other (non HMO) | Admitting: Gynecology

## 2015-03-04 VITALS — BP 124/80

## 2015-03-04 DIAGNOSIS — N644 Mastodynia: Secondary | ICD-10-CM | POA: Diagnosis not present

## 2015-03-04 DIAGNOSIS — F4323 Adjustment disorder with mixed anxiety and depressed mood: Secondary | ICD-10-CM | POA: Diagnosis not present

## 2015-03-04 MED ORDER — FLUOXETINE HCL 20 MG PO TABS: 20.0000 mg | ORAL_TABLET | Freq: Every day | ORAL | Status: DC

## 2015-03-04 NOTE — Progress Notes (Signed)
Tracey Buchanan 08-Oct-1955 [id number]        59 y.o.  G0P0  Presents with 2 issues:  1. Ongoing bilateral breast tenderness it comes and goes. Had recent ultrasound and mammogram which showed a probable right fibroadenoma. They recommended a follow up exam in 6 months. We did offer biopsy but was declined. 2. Recent loss of her father with anxiety/depression. Currently undergoing counseling. She is on fluoxetine 10 mg but the counselor had recommended increasing this to 20 mg.  Past medical history,surgical history, problem list, medications, allergies, family history and social history were all reviewed and documented in the EPIC chart.  Directed ROS with pertinent positives and negatives documented in the history of present illness/assessment and plan.  Exam: Kim assistant Filed Vitals:   03/04/15 0827  BP: 124/80   General appearance:  Normal Both breasts were examined lying and sitting without masses, retractions, discharge, adenopathy.  Assessment/Plan:  59 y.o. G0P0 with:  1. Probable fibroadenoma right breast. Follow up for repeat ultrasound at 6 month interval as recommended by radiology. Also follow up for routine mammography when due. Continue with self breast exams as long as no palpable abnormalities and the discomfort comes and goes will follow. If persists or any palpable abnormality she knows to follow up for further evaluation. 2. Anxiety/depression. Will increase her fluoxetine to 20 mg daily. Will follow up if she has any issues with this. Will continue with counseling.    Anastasio Auerbach MD, 8:48 AM 03/04/2015

## 2015-03-04 NOTE — Patient Instructions (Addendum)
Follow up if there are any issues with the increased dose of the Prozac.  Follow up for breast imaging at 6 month interval as recommended by radiology. Also follow up when you're due for just a routine mammogram.

## 2015-05-04 ENCOUNTER — Other Ambulatory Visit: Payer: Self-pay | Admitting: Gynecology

## 2015-05-04 DIAGNOSIS — D242 Benign neoplasm of left breast: Secondary | ICD-10-CM

## 2015-06-06 DIAGNOSIS — E559 Vitamin D deficiency, unspecified: Secondary | ICD-10-CM

## 2015-06-06 HISTORY — DX: Vitamin D deficiency, unspecified: E55.9

## 2015-06-09 ENCOUNTER — Ambulatory Visit
Admission: RE | Admit: 2015-06-09 | Discharge: 2015-06-09 | Disposition: A | Discharge status: Home / Self Care | Payer: Managed Care, Other (non HMO) | Source: Ambulatory Visit | Attending: Gynecology | Admitting: Gynecology

## 2015-06-09 DIAGNOSIS — D242 Benign neoplasm of left breast: Secondary | ICD-10-CM

## 2015-07-03 ENCOUNTER — Encounter: Payer: Self-pay | Admitting: Gynecology

## 2015-07-03 ENCOUNTER — Ambulatory Visit (INDEPENDENT_AMBULATORY_CARE_PROVIDER_SITE_OTHER): Payer: Managed Care, Other (non HMO) | Admitting: Gynecology

## 2015-07-03 VITALS — BP 138/80 | Ht 66.0 in | Wt 196.0 lb

## 2015-07-03 DIAGNOSIS — N318 Other neuromuscular dysfunction of bladder: Secondary | ICD-10-CM | POA: Diagnosis not present

## 2015-07-03 DIAGNOSIS — M8580 Other specified disorders of bone density and structure, unspecified site: Secondary | ICD-10-CM | POA: Diagnosis not present

## 2015-07-03 DIAGNOSIS — N952 Postmenopausal atrophic vaginitis: Secondary | ICD-10-CM

## 2015-07-03 DIAGNOSIS — N3281 Overactive bladder: Secondary | ICD-10-CM

## 2015-07-03 DIAGNOSIS — Z01419 Encounter for gynecological examination (general) (routine) without abnormal findings: Secondary | ICD-10-CM | POA: Diagnosis not present

## 2015-07-03 MED ORDER — FLUOXETINE HCL 10 MG PO CAPS: 10.0000 mg | ORAL_CAPSULE | Freq: Every day | ORAL | Status: DC

## 2015-07-03 MED ORDER — FLUOXETINE HCL 20 MG PO TABS: 20.0000 mg | ORAL_TABLET | Freq: Every day | ORAL | Status: DC

## 2015-07-03 NOTE — Progress Notes (Signed)
Tracey Buchanan 1955-12-29 [id number]        59 y.o.  G2P0020  No LMP recorded. Patient has had a hysterectomy. for annual exam.  Several issues noted below.  Past medical history,surgical history, problem list, medications, allergies, family history and social history were all reviewed and documented as reviewed in the EPIC chart.  ROS:  Performed with pertinent positives and negatives included in the history, assessment and plan.   Additional significant findings :  none   Exam: Kim Counsellor Vitals:   07/03/15 1538  BP: 138/80  Height: 5\' 6"  (1.676 m)  Weight: 196 lb (88.905 kg)   General appearance:  Normal affect, orientation and appearance. Skin: Grossly normal HEENT: Without gross lesions.  No cervical or supraclavicular adenopathy. Thyroid normal.  Lungs:  Clear without wheezing, rales or rhonchi Cardiac: RR, without RMG Abdominal:  Soft, nontender, without masses, guarding, rebound, organomegaly or hernia Breasts:  Examined lying and sitting without masses, retractions, discharge or axillary adenopathy. Pelvic:  Ext/BUS/vagina with atrophic changes  Adnexa  Without masses or tenderness    Anus and perineum  Normal   Rectovaginal  Normal sphincter tone without palpated masses or tenderness.    Assessment/Plan:  59 y.o. G66P0020 female for annual exam.   1. Postmenopausal status post TAH 2001 for leiomyoma. Without significant hot flushes, night sweats, vaginal dryness. Continue to monitor and report any issues. 2. VAIN 1 history.  History of VAIN 1 2012. Coloscopy was negative. Follow up Pap smear 2013 showed ASCUS negative high-risk HPV. Pap smear is 2013/2014/2015 negative. No Pap smear done today. Plan repeat Pap smear at 3 year interval. 3. Detrusor instability. Patient notes some urgency symptoms. Also occasional stress symptoms with full bladder. Check urinalysis. Discussed situation and options to include more frequent emptying of her bladder, dietary changes  such as decreased caffeine and possible OAB medications. Various options as far as medications discussed. Side effect profiles also reviewed. At this point patient not interested in starting any medication but prefers just to monitor and will call if she changes her mind. 4. Mammography 12/2014. Continue with annual mammography. SBE monthly reviewed. 5. Colonoscopy overdue and patient reminded to schedule.  Patient acknowledges my recommendations said she will go ahead and do this. 6. Anxiety. Patient was started on Prozac 20 mg daily June 2016. Is doing better from her anxiety which is related to the death of her father. She still notes later in the day though she has  Increasing anxiety. She takes her 20 mg in the morning. Suggested she continue with 20 mg in the morning and add 10 mg in the afternoon for a total  Daily dose of 30 mg. Patient prescribed refills for both 1 year and she'll go ahead and try this and call me should any issues with this. 7. Health maintenance. No routine blood work done as patient has this done at her primary physician's office. Follow up 1 year, sooner as needed.   Anastasio Auerbach MD, 4:33 PM 07/03/2015

## 2015-07-03 NOTE — Patient Instructions (Signed)
Increase her Prozac to 20 mg in the morning and 10 mg in the afternoon. See how you do with this and call me if you have any issues.  Follow up for vitamin D level.  You may obtain a copy of any labs that were done today by logging onto MyChart as outlined in the instructions provided with your AVS (after visit summary). The office will not call with normal lab results but certainly if there are any significant abnormalities then we will contact you.   Health Maintenance Adopting a healthy lifestyle and getting preventive care can go a long way to promote health and wellness. Talk with your health care provider about what schedule of regular examinations is right for you. This is a good chance for you to check in with your provider about disease prevention and staying healthy. In between checkups, there are plenty of things you can do on your own. Experts have done a lot of research about which lifestyle changes and preventive measures are most likely to keep you healthy. Ask your health care provider for more information. WEIGHT AND DIET  Eat a healthy diet  Be sure to include plenty of vegetables, fruits, low-fat dairy products, and lean protein.  Do not eat a lot of foods high in solid fats, added sugars, or salt.  Get regular exercise. This is one of the most important things you can do for your health.  Most adults should exercise for at least 150 minutes each week. The exercise should increase your heart rate and make you sweat (moderate-intensity exercise).  Most adults should also do strengthening exercises at least twice a week. This is in addition to the moderate-intensity exercise.  Maintain a healthy weight  Body mass index (BMI) is a measurement that can be used to identify possible weight problems. It estimates body fat based on height and weight. Your health care provider can help determine your BMI and help you achieve or maintain a healthy weight.  For females 12 years of  age and older:   A BMI below 18.5 is considered underweight.  A BMI of 18.5 to 24.9 is normal.  A BMI of 25 to 29.9 is considered overweight.  A BMI of 30 and above is considered obese.  Watch levels of cholesterol and blood lipids  You should start having your blood tested for lipids and cholesterol at 60 years of age, then have this test every 5 years.  You may need to have your cholesterol levels checked more often if:  Your lipid or cholesterol levels are high.  You are older than 61 years of age.  You are at high risk for heart disease.  CANCER SCREENING   Lung Cancer  Lung cancer screening is recommended for adults 27-78 years old who are at high risk for lung cancer because of a history of smoking.  A yearly low-dose CT scan of the lungs is recommended for people who:  Currently smoke.  Have quit within the past 15 years.  Have at least a 30-pack-year history of smoking. A pack year is smoking an average of one pack of cigarettes a day for 1 year.  Yearly screening should continue until it has been 15 years since you quit.  Yearly screening should stop if you develop a health problem that would prevent you from having lung cancer treatment.  Breast Cancer  Practice breast self-awareness. This means understanding how your breasts normally appear and feel.  It also means doing regular breast self-exams.  Let your health care provider know about any changes, no matter how small.  If you are in your 20s or 30s, you should have a clinical breast exam (CBE) by a health care provider every 1-3 years as part of a regular health exam.  If you are 60 or older, have a CBE every year. Also consider having a breast X-ray (mammogram) every year.  If you have a family history of breast cancer, talk to your health care provider about genetic screening.  If you are at high risk for breast cancer, talk to your health care provider about having an MRI and a mammogram every  year.  Breast cancer gene (BRCA) assessment is recommended for women who have family members with BRCA-related cancers. BRCA-related cancers include:  Breast.  Ovarian.  Tubal.  Peritoneal cancers.  Results of the assessment will determine the need for genetic counseling and BRCA1 and BRCA2 testing. Cervical Cancer Routine pelvic examinations to screen for cervical cancer are no longer recommended for nonpregnant women who are considered low risk for cancer of the pelvic organs (ovaries, uterus, and vagina) and who do not have symptoms. A pelvic examination may be necessary if you have symptoms including those associated with pelvic infections. Ask your health care provider if a screening pelvic exam is right for you.   The Pap test is the screening test for cervical cancer for women who are considered at risk.  If you had a hysterectomy for a problem that was not cancer or a condition that could lead to cancer, then you no longer need Pap tests.  If you are older than 65 years, and you have had normal Pap tests for the past 10 years, you no longer need to have Pap tests.  If you have had past treatment for cervical cancer or a condition that could lead to cancer, you need Pap tests and screening for cancer for at least 20 years after your treatment.  If you no longer get a Pap test, assess your risk factors if they change (such as having a new sexual partner). This can affect whether you should start being screened again.  Some women have medical problems that increase their chance of getting cervical cancer. If this is the case for you, your health care provider may recommend more frequent screening and Pap tests.  The human papillomavirus (HPV) test is another test that may be used for cervical cancer screening. The HPV test looks for the virus that can cause cell changes in the cervix. The cells collected during the Pap test can be tested for HPV.  The HPV test can be used to screen  women 47 years of age and older. Getting tested for HPV can extend the interval between normal Pap tests from three to five years.  An HPV test also should be used to screen women of any age who have unclear Pap test results.  After 60 years of age, women should have HPV testing as often as Pap tests.  Colorectal Cancer  This type of cancer can be detected and often prevented.  Routine colorectal cancer screening usually begins at 60 years of age and continues through 60 years of age.  Your health care provider may recommend screening at an earlier age if you have risk factors for colon cancer.  Your health care provider may also recommend using home test kits to check for hidden blood in the stool.  A small camera at the end of a tube can be used  to examine your colon directly (sigmoidoscopy or colonoscopy). This is done to check for the earliest forms of colorectal cancer.  Routine screening usually begins at age 41.  Direct examination of the colon should be repeated every 5-10 years through 60 years of age. However, you may need to be screened more often if early forms of precancerous polyps or small growths are found. Skin Cancer  Check your skin from head to toe regularly.  Tell your health care provider about any new moles or changes in moles, especially if there is a change in a mole's shape or color.  Also tell your health care provider if you have a mole that is larger than the size of a pencil eraser.  Always use sunscreen. Apply sunscreen liberally and repeatedly throughout the day.  Protect yourself by wearing long sleeves, pants, a wide-brimmed hat, and sunglasses whenever you are outside. HEART DISEASE, DIABETES, AND HIGH BLOOD PRESSURE   Have your blood pressure checked at least every 1-2 years. High blood pressure causes heart disease and increases the risk of stroke.  If you are between 51 years and 36 years old, ask your health care provider if you should take  aspirin to prevent strokes.  Have regular diabetes screenings. This involves taking a blood sample to check your fasting blood sugar level.  If you are at a normal weight and have a low risk for diabetes, have this test once every three years after 60 years of age.  If you are overweight and have a high risk for diabetes, consider being tested at a younger age or more often. PREVENTING INFECTION  Hepatitis B  If you have a higher risk for hepatitis B, you should be screened for this virus. You are considered at high risk for hepatitis B if:  You were born in a country where hepatitis B is common. Ask your health care provider which countries are considered high risk.  Your parents were born in a high-risk country, and you have not been immunized against hepatitis B (hepatitis B vaccine).  You have HIV or AIDS.  You use needles to inject street drugs.  You live with someone who has hepatitis B.  You have had sex with someone who has hepatitis B.  You get hemodialysis treatment.  You take certain medicines for conditions, including cancer, organ transplantation, and autoimmune conditions. Hepatitis C  Blood testing is recommended for:  Everyone born from [birth year] through 1966.  Anyone with known risk factors for hepatitis C. Sexually transmitted infections (STIs)  You should be screened for sexually transmitted infections (STIs) including gonorrhea and chlamydia if:  You are sexually active and are younger than 60 years of age.  You are older than 60 years of age and your health care provider tells you that you are at risk for this type of infection.  Your sexual activity has changed since you were last screened and you are at an increased risk for chlamydia or gonorrhea. Ask your health care provider if you are at risk.  If you do not have HIV, but are at risk, it may be recommended that you take a prescription medicine daily to prevent HIV infection. This is called  pre-exposure prophylaxis (PrEP). You are considered at risk if:  You are sexually active and do not regularly use condoms or know the HIV status of your partner(s).  You take drugs by injection.  You are sexually active with a partner who has HIV. Talk with your health care provider  about whether you are at high risk of being infected with HIV. If you choose to begin PrEP, you should first be tested for HIV. You should then be tested every 3 months for as long as you are taking PrEP.  PREGNANCY   If you are premenopausal and you may become pregnant, ask your health care provider about preconception counseling.  If you may become pregnant, take 400 to 800 micrograms (mcg) of folic acid every day.  If you want to prevent pregnancy, talk to your health care provider about birth control (contraception). OSTEOPOROSIS AND MENOPAUSE   Osteoporosis is a disease in which the bones lose minerals and strength with aging. This can result in serious bone fractures. Your risk for osteoporosis can be identified using a bone density scan.  If you are 27 years of age or older, or if you are at risk for osteoporosis and fractures, ask your health care provider if you should be screened.  Ask your health care provider whether you should take a calcium or vitamin D supplement to lower your risk for osteoporosis.  Menopause may have certain physical symptoms and risks.  Hormone replacement therapy may reduce some of these symptoms and risks. Talk to your health care provider about whether hormone replacement therapy is right for you.  HOME CARE INSTRUCTIONS   Schedule regular health, dental, and eye exams.  Stay current with your immunizations.   Do not use any tobacco products including cigarettes, chewing tobacco, or electronic cigarettes.  If you are pregnant, do not drink alcohol.  If you are breastfeeding, limit how much and how often you drink alcohol.  Limit alcohol intake to no more than 1  drink per day for nonpregnant women. One drink equals 12 ounces of beer, 5 ounces of wine, or 1 ounces of hard liquor.  Do not use street drugs.  Do not share needles.  Ask your health care provider for help if you need support or information about quitting drugs.  Tell your health care provider if you often feel depressed.  Tell your health care provider if you have ever been abused or do not feel safe at home. Document Released: 03/07/2011 Document Revised: 01/06/2014 Document Reviewed: 07/24/2013 [facility] Patient Information 2015 [place], [place]. This information is not intended to replace advice given to you by your health care provider. Make sure you discuss any questions you have with your health care provider.

## 2015-07-04 LAB — URINALYSIS W MICROSCOPIC + REFLEX CULTURE
BACTERIA UA: NONE SEEN [HPF]
Bilirubin Urine: NEGATIVE
CRYSTALS: NONE SEEN [HPF]
Casts: NONE SEEN [LPF]
Glucose, UA: NEGATIVE
Ketones, ur: NEGATIVE
Leukocytes, UA: NEGATIVE
NITRITE: NEGATIVE
PROTEIN: NEGATIVE
Specific Gravity, Urine: 1.026 (ref 1.001–1.035)
WBC, UA: NONE SEEN WBC/HPF (ref <=5)
YEAST: NONE SEEN [HPF]
pH: 6.5 (ref 5.0–8.0)

## 2015-07-04 LAB — VITAMIN D 25 HYDROXY (VIT D DEFICIENCY, FRACTURES): VIT D 25 HYDROXY: 13 ng/mL — AB (ref 30–100)

## 2015-07-05 LAB — URINE CULTURE
COLONY COUNT: NO GROWTH
ORGANISM ID, BACTERIA: NO GROWTH

## 2015-07-06 ENCOUNTER — Encounter: Payer: Self-pay | Admitting: Gynecology

## 2015-07-07 ENCOUNTER — Other Ambulatory Visit: Payer: Self-pay | Admitting: *Deleted

## 2015-07-07 DIAGNOSIS — E559 Vitamin D deficiency, unspecified: Secondary | ICD-10-CM

## 2015-07-07 MED ORDER — VITAMIN D (ERGOCALCIFEROL) 1.25 MG (50000 UNIT) PO CAPS: 50000.0000 [IU] | ORAL_CAPSULE | ORAL | Status: DC

## 2015-08-19 ENCOUNTER — Other Ambulatory Visit: Payer: Self-pay | Admitting: Gynecology

## 2016-04-27 ENCOUNTER — Other Ambulatory Visit: Payer: Self-pay | Admitting: Gynecology

## 2016-04-27 DIAGNOSIS — N6310 Unspecified lump in the right breast, unspecified quadrant: Secondary | ICD-10-CM

## 2016-05-02 ENCOUNTER — Ambulatory Visit (INDEPENDENT_AMBULATORY_CARE_PROVIDER_SITE_OTHER): Payer: Managed Care, Other (non HMO) | Admitting: Gynecology

## 2016-05-02 VITALS — BP 120/80

## 2016-05-02 DIAGNOSIS — M545 Low back pain, unspecified: Secondary | ICD-10-CM

## 2016-05-02 LAB — URINALYSIS W MICROSCOPIC + REFLEX CULTURE
Bacteria, UA: NONE SEEN [HPF]
Bilirubin Urine: NEGATIVE
CASTS: NONE SEEN [LPF]
Crystals: NONE SEEN [HPF]
Glucose, UA: NEGATIVE
Ketones, ur: NEGATIVE
Nitrite: NEGATIVE
PROTEIN: NEGATIVE
Specific Gravity, Urine: 1.02 (ref 1.001–1.035)
YEAST: NONE SEEN [HPF]
pH: 6 (ref 5.0–8.0)

## 2016-05-02 MED ORDER — CYCLOBENZAPRINE HCL 10 MG PO TABS: 10.0000 mg | ORAL_TABLET | Freq: Every day | ORAL | 0 refills | Status: DC

## 2016-05-02 MED ORDER — IBUPROFEN 800 MG PO TABS: 800.0000 mg | ORAL_TABLET | Freq: Three times a day (TID) | ORAL | 1 refills | Status: DC | PRN

## 2016-05-02 NOTE — Progress Notes (Signed)
    Tracey Buchanan 27-Oct-1955 [id number]        60 y.o.  G2P0020 presents with 4-5 days of lower back pain. Finds it worse in the morning she try to get out of bed. Seems to get better during the day. No frequency dysuria or urgency fever or chills. No nausea vomiting diarrhea constipation.  Past medical history,surgical history, problem list, medications, allergies, family history and social history were all reviewed and documented in the EPIC chart.  Directed ROS with pertinent positives and negatives documented in the history of present illness/assessment and plan.  Exam: Tracey Buchanan assistant Vitals:   05/02/16 1230  BP: 120/80   General appearance:  Normal Mild tenderness bilateral lower back. No overt muscle spasm or deformity. Abdomen soft nontender without masses guarding rebound Pelvic external BUS vagina with atrophic changes. No bleeding noted. Bimanual exam without masses or tenderness  Assessment/Plan:  60 y.o. G2P0020 with history as above. Urinalysis shows 3-10 RBCs and 0-5 WBCs on an unspun specimen.  Question whether she is having some low back muscle spasm. Also discussed possible renal lithiasis but does not seem to be having that kind of pain. Possible low-grade UTI but again no significant white cells. Will culture the urine and follow up and treat if positive. Recheck clean-catch urinalysis regardless in several weeks to make sure hematuria clears and to treat for low back pain with Flexeril 10 mg at bedtime #15 and ibuprofen 800 mg 3 times daily #30 with 1 refill. Patient will follow up if her symptoms persist or worsen.    Anastasio Auerbach MD, 12:55 PM 05/02/2016

## 2016-05-02 NOTE — Patient Instructions (Signed)
Take the Flexeril at bedtime   Take the ibuprofen 3 times daily as needed for pain   We will follow up on the urine culture   Follow up in several weeks to repeat a clean catch urine analysis to follow up on the blood in the urine

## 2016-05-02 NOTE — Addendum Note (Signed)
Addended by: Irena Cords on: 05/02/2016 02:33 PM   Modules accepted: Orders

## 2016-05-04 ENCOUNTER — Other Ambulatory Visit: Payer: Managed Care, Other (non HMO)

## 2016-05-04 LAB — URINE CULTURE

## 2016-05-05 ENCOUNTER — Other Ambulatory Visit: Payer: Self-pay | Admitting: Gynecology

## 2016-05-05 MED ORDER — CIPROFLOXACIN HCL 250 MG PO TABS: 250.0000 mg | ORAL_TABLET | Freq: Two times a day (BID) | ORAL | 0 refills | Status: DC

## 2016-05-23 ENCOUNTER — Other Ambulatory Visit: Payer: Managed Care, Other (non HMO)

## 2016-06-16 ENCOUNTER — Other Ambulatory Visit: Payer: Managed Care, Other (non HMO)

## 2016-06-28 ENCOUNTER — Ambulatory Visit
Admission: RE | Admit: 2016-06-28 | Discharge: 2016-06-28 | Disposition: A | Discharge status: Home / Self Care | Payer: Managed Care, Other (non HMO) | Source: Ambulatory Visit | Attending: Gynecology | Admitting: Gynecology

## 2016-06-28 DIAGNOSIS — N6310 Unspecified lump in the right breast, unspecified quadrant: Secondary | ICD-10-CM

## 2016-07-04 ENCOUNTER — Ambulatory Visit (INDEPENDENT_AMBULATORY_CARE_PROVIDER_SITE_OTHER): Payer: Managed Care, Other (non HMO) | Admitting: Gynecology

## 2016-07-04 ENCOUNTER — Encounter: Payer: Self-pay | Admitting: Gynecology

## 2016-07-04 VITALS — BP 120/82 | Ht 66.0 in | Wt 197.0 lb

## 2016-07-04 DIAGNOSIS — N952 Postmenopausal atrophic vaginitis: Secondary | ICD-10-CM

## 2016-07-04 DIAGNOSIS — Z01419 Encounter for gynecological examination (general) (routine) without abnormal findings: Secondary | ICD-10-CM | POA: Diagnosis not present

## 2016-07-04 DIAGNOSIS — M8580 Other specified disorders of bone density and structure, unspecified site: Secondary | ICD-10-CM

## 2016-07-04 MED ORDER — FLUOXETINE HCL 20 MG PO TABS: 20.0000 mg | ORAL_TABLET | Freq: Every day | ORAL | 4 refills | Status: DC

## 2016-07-04 NOTE — Patient Instructions (Signed)
Schedule your colonoscopy with either:  [facility]   Address: [address], [place], [place] [address]  [phone]    or  [facility]  Address: [address], [place], [place] [address]  [phone]      

## 2016-07-04 NOTE — Progress Notes (Signed)
    Tracey Buchanan 02-03-1956 [id number]        60 y.o.  G2P0020  for annual exam.  Doing well from a gynecologic standpoint.  Past medical history,surgical history, problem list, medications, allergies, family history and social history were all reviewed and documented as reviewed in the EPIC chart.  ROS:  Performed with pertinent positives and negatives included in the history, assessment and plan.   Additional significant findings :  None   Exam: Tracey Buchanan assistant Vitals:   07/04/16 1544  BP: 120/82  Weight: 197 lb (89.4 kg)  Height: 5\' 6"  (1.676 m)   Body mass index is 31.8 kg/m.  General appearance:  Normal affect, orientation and appearance. Skin: Grossly normal HEENT: Without gross lesions.  No cervical or supraclavicular adenopathy. Thyroid normal.  Lungs:  Clear without wheezing, rales or rhonchi Cardiac: RR, without RMG Abdominal:  Soft, nontender, without masses, guarding, rebound, organomegaly or hernia Breasts:  Examined lying and sitting without masses, retractions, discharge or axillary adenopathy. Pelvic:  Ext, BUS, Vagina with atrophic changes  Adnexa without masses or tenderness    Anus and perineum normal   Rectovaginal normal sphincter tone without palpated masses or tenderness.    Assessment/Plan:  60 y.o. G60P0020 female for annual exam.   1. Postmenopausal/atrophic genital changes. Status post TVH 2001 for leiomyoma. Doing well without significant hot flushes, night sweats or vaginal dryness. 2. History of VAIN 1 2012. Colposcopy was negative. Follow up Pap smears 2013/2014/2015 all negative. No Pap smear done today. Plan repeat Pap smear next year at 3 year interval. 3. Mammography 06/2016. Continue with annual mammography when due. SBE monthly reviewed. 4. Osteopenia. DEXA 2012 T score -1.3. Options for DEXA now reviewed. Patient reluctant to do this year.  We'll plan to do next year. 5. Colonoscopy over 10 years. Reviewed with patient  second-most common cancer in women. Strongly recommended she schedule colonoscopy and she agrees to arrange this year. 6. Anxiety. Patient continues on fluoxetine 20 mg daily. Had been taking 30 mg but is back down to 20 doing well with this and wants to continue. Refill 1 year provided. 7. Health maintenance. No routine lab work done as patient reports this done elsewhere. Follow up in one year, sooner as needed.   Anastasio Auerbach MD, 4:08 PM 07/04/2016

## 2016-07-20 ENCOUNTER — Telehealth: Payer: Self-pay

## 2016-07-20 NOTE — Telephone Encounter (Signed)
I received refill request from patient's pharmacy for her Fluoxetine capsules 10 mg.  However, Dr. Dorette Grate last note in chart said she had been taking 30 mg now is taking 20 mg and wants to stay on that. I left message for patient to call me to clarify Rx dose so that we can assist her with getting it refilled.

## 2016-07-21 NOTE — Telephone Encounter (Signed)
Left message with pharmacy and asked them to have patient call about Rx.

## 2016-07-22 ENCOUNTER — Telehealth: Payer: Self-pay

## 2016-07-22 MED ORDER — FLUOXETINE HCL 10 MG PO CAPS: 10.0000 mg | ORAL_CAPSULE | Freq: Every day | ORAL | 3 refills | Status: DC

## 2016-07-22 NOTE — Telephone Encounter (Signed)
Okay to refill Prozac 10 mg #90 with refills through her next scheduled annual exam

## 2016-07-22 NOTE — Telephone Encounter (Signed)
Rx sent for 90 day supply.

## 2016-07-22 NOTE — Telephone Encounter (Signed)
I had received refill request from the pharmacy for generic Prozac 10 mg.  Your note from [facility] at end of Oct said she had been on 30mg . But had dropped to 20mg  and wanted to stay on that and you sent Rx.  I asked patient to call me to clarify and she did today. She said that she has had the 10mg  for awhile. That is what pharmacy gave her and she seems to be feeling fine on them.  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 She was very happy.  Ok to prescribed the Prozac 10 mg?  She asked for 90 d supply.

## 2017-03-06 ENCOUNTER — Ambulatory Visit: Payer: Managed Care, Other (non HMO) | Admitting: Interventional Cardiology

## 2017-03-14 ENCOUNTER — Encounter: Payer: Self-pay | Admitting: Cardiovascular Disease

## 2017-04-24 ENCOUNTER — Ambulatory Visit: Payer: Managed Care, Other (non HMO) | Admitting: Gynecology

## 2017-08-09 ENCOUNTER — Encounter: Payer: Self-pay | Admitting: Gynecology

## 2017-08-09 ENCOUNTER — Ambulatory Visit: Payer: 59 | Admitting: Gynecology

## 2017-08-09 VITALS — BP 122/78 | Ht 66.0 in | Wt 188.0 lb

## 2017-08-09 DIAGNOSIS — N952 Postmenopausal atrophic vaginitis: Secondary | ICD-10-CM

## 2017-08-09 DIAGNOSIS — M8580 Other specified disorders of bone density and structure, unspecified site: Secondary | ICD-10-CM | POA: Diagnosis not present

## 2017-08-09 DIAGNOSIS — Z01411 Encounter for gynecological examination (general) (routine) with abnormal findings: Secondary | ICD-10-CM | POA: Diagnosis not present

## 2017-08-09 MED ORDER — FLUOXETINE HCL 10 MG PO CAPS: 10.0000 mg | ORAL_CAPSULE | Freq: Every day | ORAL | 3 refills | Status: DC

## 2017-08-09 NOTE — Patient Instructions (Signed)
Follow up for bone density as scheduled Schedule your mammogram Schedule your colooscopy

## 2017-08-09 NOTE — Progress Notes (Signed)
    MCKYNNA VANLOAN 1956/03/29 [id number]        61 y.o.  G2P0020 for annual gynecologic exam.  Doing well without gynecologic complaints.  Past medical history,surgical history, problem list, medications, allergies, family history and social history were all reviewed and documented as reviewed in the EPIC chart.  ROS:  Performed with pertinent positives and negatives included in the history, assessment and plan.   Additional significant findings : None   Exam: [name] assistant Vitals:   08/09/17 1600  BP: 122/78  Weight: 188 lb (85.3 kg)  Height: 5\' 6"  (1.676 m)   Body mass index is 30.34 kg/m.  General appearance:  Normal affect, orientation and appearance. Skin: Grossly normal HEENT: Without gross lesions.  No cervical or supraclavicular adenopathy. Thyroid normal.  Lungs:  Clear without wheezing, rales or rhonchi Cardiac: RR, without RMG Abdominal:  Soft, nontender, without masses, guarding, rebound, organomegaly or hernia Breasts:  Examined lying and sitting without masses, retractions, discharge or axillary adenopathy. Pelvic:  Ext, BUS, Vagina: With atrophic changes.  Pap smear of cuff done  Adnexa: Without masses or tenderness    Anus and perineum: Normal   Rectovaginal: Normal sphincter tone without palpated masses or tenderness.     Assessment/Plan:  61 y.o. G43P0020 female for annual gynecologic exam.   1. Postmenopausal/atrophic genital changes.  Status post TAH 2001 for leiomyoma.  Doing well without significant hot flushes, night sweats or vaginal dryness. 2. History of VAIN 1 2012.  Follow-up Pap smears negative.  Last Pap smear 2015.  Pap smear of vaginal cuff done today. 3. Mammography 06/2016.  I reminded patient she is overdue and she knows to call and schedule.  Breast exam normal today. 4. Osteopenia.  DEXA 2012 T score -1.3.  Recommend baseline DEXA now and patient agrees to schedule. 5. Colonoscopy 10 years ago.  Patient knows she is due and  agrees to call and schedule. 6. Anxiety.  Uses fluoxetine 10 mg daily.  Doing well with this.  Refill times 1 year provided. 7. Health maintenance.  Patient relates episode of vague transient chest discomfort several weeks ago.  No associated lightheadedness nausea vomiting dizziness or referral of the pain.  Had seen her primary several weeks ago by her history.  Is planning on doing a stress test.  I stressed the importance of her follow-up for this and I reviewed that cardiac disease in women can be very atypical in presentation is important follow-up with her primary physician and testing as they recommend.  No routine lab work done today she does this through her primary physician's office.  Follow-up for bone density, colonoscopy and mammography.  Otherwise follow-up in 1 year for annual exam. 8. Health maintenance.   Anastasio Auerbach MD, 4:35 PM 08/09/2017

## 2017-08-09 NOTE — Addendum Note (Signed)
Addended by: Nelva Nay on: 08/09/2017 04:44 PM   Modules accepted: Orders

## 2017-08-10 ENCOUNTER — Other Ambulatory Visit: Payer: Self-pay | Admitting: Gynecology

## 2017-08-10 DIAGNOSIS — Z1231 Encounter for screening mammogram for malignant neoplasm of breast: Secondary | ICD-10-CM

## 2017-08-10 LAB — PAP IG W/ RFLX HPV ASCU

## 2017-08-15 ENCOUNTER — Ambulatory Visit: Payer: Managed Care, Other (non HMO) | Admitting: Cardiovascular Disease

## 2017-08-22 ENCOUNTER — Encounter: Payer: Self-pay | Admitting: Cardiovascular Disease

## 2017-08-22 ENCOUNTER — Ambulatory Visit: Payer: 59 | Admitting: Cardiovascular Disease

## 2017-08-22 VITALS — BP 142/94 | HR 58 | Ht 66.0 in | Wt 185.0 lb

## 2017-08-22 DIAGNOSIS — Z13220 Encounter for screening for lipoid disorders: Secondary | ICD-10-CM

## 2017-08-22 DIAGNOSIS — I10 Essential (primary) hypertension: Secondary | ICD-10-CM

## 2017-08-22 DIAGNOSIS — R079 Chest pain, unspecified: Secondary | ICD-10-CM | POA: Diagnosis not present

## 2017-08-22 DIAGNOSIS — R0789 Other chest pain: Secondary | ICD-10-CM | POA: Insufficient documentation

## 2017-08-22 NOTE — Progress Notes (Signed)
08/22/2017 Tracey Buchanan   03-07-1956  [id number]  Primary Physician Donald Prose, MD Primary Cardiologist: Lorretta Harp MD FACP, [place], [place], [place]  HPI:  Tracey Buchanan is a 61 y.o. mild to moderately overweight single African-American female no children who is self-referred for evaluation of chest pain. She does work as an Art gallery manager for [employer]. Risk factors include continued tobacco abuse of one third of a pack a day for the last 40 years as well as history of hypertension. Her mother did have a CABG. She has never had a heart attack or stroke. She's had chest pain for last 6 weeks which is somewhat atypical. Mostly occurs at night or in the morning.    Current Meds  Medication Sig  . cyclobenzaprine (FLEXERIL) 10 MG tablet Take 1 tablet (10 mg total) by mouth at bedtime.  Marland Kitchen dexlansoprazole (DEXILANT) 60 MG capsule Take 60 mg by mouth daily as needed. For heart burn  . FLUoxetine (PROZAC) 10 MG capsule Take 1 capsule (10 mg total) by mouth daily.  Marland Kitchen ibuprofen (ADVIL,MOTRIN) 800 MG tablet Take 1 tablet (800 mg total) by mouth every 8 (eight) hours as needed.  Marland Kitchen LORazepam (ATIVAN) 0.5 MG tablet Take 1 tablet (0.5 mg total) by mouth every 8 (eight) hours. For anxiety  . olmesartan-hydrochlorothiazide (BENICAR HCT) 40-25 MG per tablet Take 1 tablet by mouth daily.       Allergies  Allergen Reactions  . Codeine   . Cymbalta [Duloxetine Hcl] Nausea Only  . Effexor [Venlafaxine]     nightmares  . Flexeril [Cyclobenzaprine]     Urinary frequency  . Prevacid [Lansoprazole]     Lack of therapeutic effect  . Prilosec [Omeprazole]     Lack of therapeutic effect  . Wellbutrin Xl [Bupropion]     nightmares    Social History   Socioeconomic History  . Marital status: Single    Spouse name: Not on file  . Number of children: Not on file  . Years of education: Not on file  . Highest education level: Not on file  Social Needs  . Financial resource  strain: Not on file  . Food insecurity - worry: Not on file  . Food insecurity - inability: Not on file  . Transportation needs - medical: Not on file  . Transportation needs - non-medical: Not on file  Occupational History  . Not on file  Tobacco Use  . Smoking status: Current Every Day Smoker    Packs/day: 0.25    Types: Cigarettes  . Smokeless tobacco: Never Used  Substance and Sexual Activity  . Alcohol use: Yes    Alcohol/week: 4.2 oz    Types: 7 Glasses of wine per week  . Drug use: No  . Sexual activity: Not Currently    Birth control/protection: Surgical    Comment: 1st intercourse 19 yo-5 partners  Other Topics Concern  . Not on file  Social History Narrative  . Not on file     Review of Systems: General: negative for chills, fever, night sweats or weight changes.  Cardiovascular: negative for chest pain, dyspnea on exertion, edema, orthopnea, palpitations, paroxysmal nocturnal dyspnea or shortness of breath Dermatological: negative for rash Respiratory: negative for cough or wheezing Urologic: negative for hematuria Abdominal: negative for nausea, vomiting, diarrhea, bright red blood per rectum, melena, or hematemesis Neurologic: negative for visual changes, syncope, or dizziness All other systems reviewed and are otherwise negative except as noted above.  Blood pressure (!) 142/94, pulse (!) 58, height 5\' 6"  (1.676 m), weight 185 lb (83.9 kg).  General appearance: alert and no distress Neck: no adenopathy, no carotid bruit, no JVD, supple, symmetrical, trachea midline and thyroid not enlarged, symmetric, no tenderness/mass/nodules Lungs: clear to auscultation bilaterally Heart: regular rate and rhythm, S1, S2 normal, no murmur, click, rub or gallop Extremities: extremities normal, atraumatic, no cyanosis or edema Pulses: 2+ and symmetric Skin: Skin color, texture, turgor normal. No rashes or lesions Neurologic: Alert and oriented X 3, normal strength and  tone. Normal symmetric reflexes. Normal coordination and gait  EKG sinus bradycardia 58 without ST or T-wave changes. I personally reviewed this EKG.  ASSESSMENT AND PLAN:   Atypical chest pain Tracey Buchanan is self-referred for evaluation of atypical chest pain. She does have a long history of tobacco abuse having smoked approximately 20-30 pack years continuing to smoke a third of a pack a day as well as a history of hypertension and family history of heart disease. She's had some chest heaviness over the last 6 weeks although she does have GERD as well. I am going to get an exercise Myoview stress test to risk stratify her.  Hypertension History of essential hypertension blood pressure measured 142/94. She is on Benicar hydrochlorothiazide that she has not taken this for the last 2 days. Continue current meds at current dosing.      Lorretta Harp MD FACP,FACC,FAHA, [facility] 08/22/2017 4:44 PM

## 2017-08-22 NOTE — Patient Instructions (Signed)
Medication Instructions: Your physician recommends that you continue on your current medications as directed. Please refer to the Current Medication list given to you today.   Labwork: Your physician recommends that you return for a FASTING lipid profile and hepatic function panel at your earliest convenience.   Testing/Procedures: Your physician has requested that you have an exercise stress myoview. For further information please visit [email or web address]. Please follow instruction sheet, as given.  Follow-Up: Your physician recommends that you schedule a follow-up appointment after myoview with Dr. Gwenlyn Found.  If you need a refill on your cardiac medications before your next appointment, please call your pharmacy.

## 2017-08-22 NOTE — Assessment & Plan Note (Signed)
Tracey Buchanan is self-referred for evaluation of atypical chest pain. She does have a long history of tobacco abuse having smoked approximately 20-30 pack years continuing to smoke a third of a pack a day as well as a history of hypertension and family history of heart disease. She's had some chest heaviness over the last 6 weeks although she does have GERD as well. I am going to get an exercise Myoview stress test to risk stratify her.

## 2017-08-22 NOTE — Assessment & Plan Note (Signed)
History of essential hypertension blood pressure measured 142/94. She is on Benicar hydrochlorothiazide that she has not taken this for the last 2 days. Continue current meds at current dosing.

## 2017-08-24 NOTE — Addendum Note (Signed)
Addended by: Venetia Maxon on: 08/24/2017 08:42 AM   Modules accepted: Orders

## 2017-08-30 ENCOUNTER — Telehealth (HOSPITAL_COMMUNITY): Payer: Self-pay

## 2017-08-30 NOTE — Telephone Encounter (Signed)
Encounter complete. 

## 2017-08-31 ENCOUNTER — Telehealth (HOSPITAL_COMMUNITY): Payer: Self-pay

## 2017-08-31 NOTE — Telephone Encounter (Signed)
Encounter complete. 

## 2017-09-01 ENCOUNTER — Ambulatory Visit (HOSPITAL_COMMUNITY)
Admission: RE | Admit: 2017-09-01 | Payer: 59 | Source: Ambulatory Visit | Attending: Cardiovascular Disease | Admitting: Cardiovascular Disease

## 2017-09-06 ENCOUNTER — Telehealth (HOSPITAL_COMMUNITY): Payer: Self-pay

## 2017-09-06 NOTE — Telephone Encounter (Signed)
No voicemail on home number. No answer. Encounter complete.

## 2017-09-07 ENCOUNTER — Telehealth (HOSPITAL_COMMUNITY): Payer: Self-pay

## 2017-09-07 NOTE — Telephone Encounter (Signed)
Encounter complete. 

## 2017-09-08 ENCOUNTER — Ambulatory Visit (HOSPITAL_COMMUNITY)
Admission: RE | Admit: 2017-09-08 | Discharge: 2017-09-08 | Disposition: A | Discharge status: Home / Self Care | Payer: 59 | Source: Ambulatory Visit | Attending: Cardiovascular Disease | Admitting: Cardiovascular Disease

## 2017-09-08 DIAGNOSIS — R079 Chest pain, unspecified: Secondary | ICD-10-CM

## 2017-09-08 LAB — MYOCARDIAL PERFUSION IMAGING
CHL CUP NUCLEAR SRS: 0
CHL CUP NUCLEAR SSS: 0
LV dias vol: 100 mL (ref 46–106)
LV sys vol: 46 mL
Peak HR: 88 {beats}/min
Rest HR: 50 {beats}/min
SDS: 0
TID: 1.07

## 2017-09-08 MED ORDER — TECHNETIUM TC 99M TETROFOSMIN IV KIT
32.1000 | PACK | Freq: Once | INTRAVENOUS | Status: AC | PRN
  Administered 2017-09-08: 32.1 via INTRAVENOUS
  Filled 2017-09-08: qty 33

## 2017-09-08 MED ORDER — TECHNETIUM TC 99M TETROFOSMIN IV KIT
10.8000 | PACK | Freq: Once | INTRAVENOUS | Status: AC | PRN
  Administered 2017-09-08: 10.8 via INTRAVENOUS
  Filled 2017-09-08: qty 11

## 2017-09-08 MED ORDER — AMINOPHYLLINE 25 MG/ML IV SOLN
75.0000 mg | Freq: Once | INTRAVENOUS | Status: AC
  Administered 2017-09-08: 75 mg via INTRAVENOUS

## 2017-09-08 MED ORDER — REGADENOSON 0.4 MG/5ML IV SOLN
0.4000 mg | Freq: Once | INTRAVENOUS | Status: AC
  Administered 2017-09-08: 0.4 mg via INTRAVENOUS

## 2017-09-13 ENCOUNTER — Telehealth: Payer: Self-pay | Admitting: Cardiovascular Disease

## 2017-09-13 NOTE — Telephone Encounter (Signed)
Returning your call from yesterday. °

## 2017-09-13 NOTE — Telephone Encounter (Signed)
Gave pt stress test results. Pt verbalized understanding and thanks for the call.

## 2017-09-25 ENCOUNTER — Ambulatory Visit
Admission: RE | Admit: 2017-09-25 | Discharge: 2017-09-25 | Disposition: A | Discharge status: Home / Self Care | Payer: 59 | Source: Ambulatory Visit | Attending: Gynecology | Admitting: Gynecology

## 2017-09-25 DIAGNOSIS — Z1231 Encounter for screening mammogram for malignant neoplasm of breast: Secondary | ICD-10-CM

## 2017-09-25 DIAGNOSIS — M8580 Other specified disorders of bone density and structure, unspecified site: Secondary | ICD-10-CM

## 2018-02-11 ENCOUNTER — Emergency Department (HOSPITAL_COMMUNITY): Payer: 59

## 2018-02-11 ENCOUNTER — Emergency Department (HOSPITAL_COMMUNITY)
Admission: EM | Admit: 2018-02-11 | Discharge: 2018-02-11 | Disposition: A | Discharge status: Home / Self Care | Payer: 59 | Attending: Emergency Medicine | Admitting: Emergency Medicine

## 2018-02-11 ENCOUNTER — Encounter (HOSPITAL_COMMUNITY): Payer: Self-pay | Admitting: Emergency Medicine

## 2018-02-11 DIAGNOSIS — I10 Essential (primary) hypertension: Secondary | ICD-10-CM | POA: Insufficient documentation

## 2018-02-11 DIAGNOSIS — M549 Dorsalgia, unspecified: Secondary | ICD-10-CM | POA: Diagnosis not present

## 2018-02-11 DIAGNOSIS — R55 Syncope and collapse: Secondary | ICD-10-CM | POA: Diagnosis not present

## 2018-02-11 DIAGNOSIS — Z79899 Other long term (current) drug therapy: Secondary | ICD-10-CM | POA: Diagnosis not present

## 2018-02-11 DIAGNOSIS — F17210 Nicotine dependence, cigarettes, uncomplicated: Secondary | ICD-10-CM | POA: Diagnosis not present

## 2018-02-11 DIAGNOSIS — M542 Cervicalgia: Secondary | ICD-10-CM | POA: Diagnosis not present

## 2018-02-11 LAB — COMPREHENSIVE METABOLIC PANEL
ALBUMIN: 4.1 g/dL (ref 3.5–5.0)
ALT: 15 U/L (ref 14–54)
ANION GAP: 7 (ref 5–15)
AST: 19 U/L (ref 15–41)
Alkaline Phosphatase: 84 U/L (ref 38–126)
BUN: 11 mg/dL (ref 6–20)
CHLORIDE: 109 mmol/L (ref 101–111)
CO2: 26 mmol/L (ref 22–32)
Calcium: 9.1 mg/dL (ref 8.9–10.3)
Creatinine, Ser: 0.95 mg/dL (ref 0.44–1.00)
GFR calc Af Amer: >60 mL/min (ref >60)
GFR calc non Af Amer: >60 mL/min (ref >60)
GLUCOSE: 87 mg/dL (ref 65–99)
POTASSIUM: 3.3 mmol/L — AB (ref 3.5–5.1)
SODIUM: 142 mmol/L (ref 135–145)
TOTAL PROTEIN: 6.9 g/dL (ref 6.5–8.1)
Total Bilirubin: 0.6 mg/dL (ref 0.3–1.2)

## 2018-02-11 LAB — URINALYSIS, ROUTINE W REFLEX MICROSCOPIC
Bilirubin Urine: NEGATIVE
Glucose, UA: NEGATIVE mg/dL
KETONES UR: NEGATIVE mg/dL
Leukocytes, UA: NEGATIVE
Nitrite: NEGATIVE
PROTEIN: NEGATIVE mg/dL
Specific Gravity, Urine: 1.02 (ref 1.005–1.030)
pH: 6 (ref 5.0–8.0)

## 2018-02-11 LAB — CBC
HEMATOCRIT: 44.1 % (ref 36.0–46.0)
HEMOGLOBIN: 14.3 g/dL (ref 12.0–15.0)
MCH: 27.1 pg (ref 26.0–34.0)
MCHC: 32.4 g/dL (ref 30.0–36.0)
MCV: 83.7 fL (ref 78.0–100.0)
Platelets: 212 10*3/uL (ref 150–400)
RBC: 5.27 MIL/uL — AB (ref 3.87–5.11)
RDW: 15.1 % (ref 11.5–15.5)
WBC: 7.2 10*3/uL (ref 4.0–10.5)

## 2018-02-11 LAB — CBG MONITORING, ED: GLUCOSE-CAPILLARY: 79 mg/dL (ref 65–99)

## 2018-02-11 LAB — TROPONIN I

## 2018-02-11 MED ORDER — HYDROCHLOROTHIAZIDE 25 MG PO TABS
25.0000 mg | ORAL_TABLET | Freq: Once | ORAL | Status: AC
  Administered 2018-02-11: 25 mg via ORAL
  Filled 2018-02-11: qty 1

## 2018-02-11 MED ORDER — IRBESARTAN 300 MG PO TABS
300.0000 mg | ORAL_TABLET | Freq: Once | ORAL | Status: AC
  Administered 2018-02-11: 300 mg via ORAL
  Filled 2018-02-11 (×2): qty 1

## 2018-02-11 NOTE — ED Notes (Signed)
Pt refused lab draws in triage.

## 2018-02-11 NOTE — ED Triage Notes (Signed)
Pt presents to ED for assessment after having a syncopal episode yesterday after sitting on the front porch with a friend.  Patient states she felt flush after going inside to get a drink, laid down for a bit and felt better.  Came back outside to be with her friend and when they went to part ways, she does not remember, but her friend states she turned around and patient was on the floor confused and having difficulty standing up.  Pt also c/o slurred speech at that time.  Patient symptoms resolved at this time, but patient still feels generally weak today.  No focal neuro deficits noted in triage.  C/o neck and back pain today.

## 2018-02-11 NOTE — ED Provider Notes (Signed)
[facility] EMERGENCY DEPARTMENT Provider Note   CSN: [id number] Arrival date & time: 02/11/18  1203     History   Chief Complaint Chief Complaint  Patient presents with  . Loss of Consciousness    HPI Tracey Buchanan is a 62 y.o. female.  HPI Patient presents after an episode of seizure versus syncope. She has no history of syncope.    The episode occurred yesterday. Patient was in her usual state of health, she notes that she was sitting on front porch, with a companion, and after standing up, taking few steps, she then recalls awakening with her friend overlooking her. She had a some slight delay in return to normal interactivity, and since that time has had pain in her occiput, neck, and the paraspinal area and throughout her back. She also suffered a laceration to her tongue. She notes that she did not sleep much yesterday, and after telling her daughter about the event this morning, she was brought for starting care and then here for evaluation. Now she currently complains of soreness throughout the head, neck, back, but no chest pain, no abdominal pain, no current lightheadedness, nor any focal weakness in any extremity. No medication taken since the event. No recent medication change, diet change per She did not take today's medications, including her antihypertensive.   Patient does not smoke, and although she is trying to quit, we discussed the importance of cessation, which she acknowledged.  Past Medical History:  Diagnosis Date  . Anxiety   . ASCUS (atypical squamous cells of undetermined significance) on Pap smear 11/2011   negative high risk HPV  . DVT (deep venous thrombosis) ([place])    on birth control pills as a teenager  . Hydrosalpinx   . Hypertension   . Osteopenia 09/2017   T score -2.0 FRAX 4.5% / 0.9%  . VAIN I (vaginal intraepithelial neoplasia grade I) 11/2010   negative colposcopy  . Vitamin D deficiency 06/2015   value 13  recommended 50,000 units weekly 12 with recheck of vitamin D level    Patient Active Problem List   Diagnosis Date Noted  . Atypical chest pain 08/22/2017  . Anxiety   . Hypertension   . DVT (deep venous thrombosis) ([place])   . Osteopenia   . Hydrosalpinx     Past Surgical History:  Procedure Laterality Date  . ABDOMINAL HYSTERECTOMY  2001   Leiomyomata  . CARDIAC CATHETERIZATION    . [place]  . PELVIC LAPAROSCOPY       OB History    Gravida  2   Para      Term      Preterm      AB  2   Living  0     SAB      TAB      Ectopic      Multiple      Live Births               Home Medications    Prior to Admission medications   Medication Sig Start Date End Date Taking? Authorizing Provider  FLUoxetine (PROZAC) 10 MG capsule Take 1 capsule (10 mg total) by mouth daily. 08/09/17  Yes Fontaine, Belinda Block, MD  ibuprofen (ADVIL,MOTRIN) 800 MG tablet Take 1 tablet (800 mg total) by mouth every 8 (eight) hours as needed. Patient taking differently: Take 400 mg by mouth every 8 (eight) hours as needed for mild pain.  05/02/16  Yes Fontaine, Belinda Block, MD  LORazepam (ATIVAN) 0.5 MG tablet Take 1 tablet (0.5 mg total) by mouth every 8 (eight) hours. For anxiety Patient taking differently: Take 0.5 mg by mouth every 4 (four) hours as needed for anxiety. For anxiety 04/17/13  Yes Fontaine, Belinda Block, MD  olmesartan-hydrochlorothiazide (BENICAR HCT) 40-25 MG per tablet Take 1 tablet by mouth daily.     Yes [provider]  cyclobenzaprine (FLEXERIL) 10 MG tablet Take 1 tablet (10 mg total) by mouth at bedtime. Patient not taking: Reported on 02/11/2018 05/02/16   Fontaine, Belinda Block, MD    Family History Family History  Problem Relation Age of Onset  . Diabetes Mother   . Heart disease Mother   . Diabetes Father   . Hypertension Father   . Diabetes Brother   . Hypertension Brother   . Sarcoidosis Brother   . Sarcoidosis Brother     . Diabetes Brother     Social History Social History   Tobacco Use  . Smoking status: Current Every Day Smoker    Packs/day: 0.25    Types: Cigarettes  . Smokeless tobacco: Never Used  Substance Use Topics  . Alcohol use: Yes    Alcohol/week: 4.2 oz    Types: 7 Glasses of wine per week  . Drug use: No     Allergies   Cymbalta [duloxetine hcl]; Effexor [venlafaxine]; Flexeril [cyclobenzaprine]; Prevacid [lansoprazole]; Prilosec [omeprazole]; Wellbutrin xl [bupropion]; and Codeine   Review of Systems Review of Systems  Constitutional:       Per HPI, otherwise negative  HENT:       Tongue laceration  Respiratory:       Per HPI, otherwise negative  Cardiovascular:       Per HPI, otherwise negative  Gastrointestinal: Negative for vomiting.  Endocrine:       Negative aside from HPI  Genitourinary:       Neg aside from HPI   Musculoskeletal:       Per HPI, otherwise negative  Skin: Negative.   Neurological: Positive for syncope.     Physical Exam Updated Vital Signs BP (!) 187/81   Pulse (!) 51   Temp 98.3 F (36.8 C)   Resp 17   SpO2 100%   Physical Exam  Constitutional: She is oriented to person, place, and time. She appears well-developed and well-nourished. No distress.  HENT:  Head: Normocephalic and atraumatic.  Mouth/Throat:    Eyes: Conjunctivae and EOM are normal.  Neck: Full passive range of motion without pain. Neck supple. No spinous process tenderness and no muscular tenderness present. No neck rigidity. No edema, no erythema and normal range of motion present.  Cardiovascular: Normal rate and regular rhythm.  Pulmonary/Chest: Effort normal and breath sounds normal. No stridor. No respiratory distress.  Abdominal: She exhibits no distension.  Musculoskeletal: She exhibits no edema.  Neurological: She is alert and oriented to person, place, and time. She displays no atrophy and no tremor. No cranial nerve deficit. She exhibits normal muscle  tone. She displays no seizure activity. Coordination normal.  Poss 4+/5 strength L LE compared to R LE 5/5  Skin: Skin is warm and dry.  Psychiatric: She has a normal mood and affect.  Nursing note and vitals reviewed.    ED Treatments / Results  Labs (all labs ordered are listed, but only abnormal results are displayed) Labs Reviewed  CBC - Abnormal; Notable for the following components:      Result Value  RBC 5.27 (*)    All other components within normal limits  URINALYSIS, ROUTINE W REFLEX MICROSCOPIC - Abnormal; Notable for the following components:   Hgb urine dipstick SMALL (*)    Bacteria, UA RARE (*)    All other components within normal limits  COMPREHENSIVE METABOLIC PANEL - Abnormal; Notable for the following components:   Potassium 3.3 (*)    All other components within normal limits  TROPONIN I  CBG MONITORING, ED    EKG EKG Interpretation  Date/Time:  Sunday February 11 2018 12:30:46 EDT Ventricular Rate:  56 PR Interval:  138 QRS Duration: 78 QT Interval:  436 QTC Calculation: 420 R Axis:   49 Text Interpretation:  Sinus bradycardia Nonspecific ST and T wave abnormality Abnormal ECG Confirmed by Quintella Reichert [phone]) on 02/11/2018 1:03:58 PM Also confirmed by Quintella Reichert [phone]), editor Lynder Parents [phone])  on 02/11/2018 1:21:10 PM   Radiology Dg Chest 2 View  Result Date: 02/11/2018 CLINICAL DATA:  Syncope, fall EXAM: CHEST - 2 VIEW COMPARISON:  02/17/2014 chest radiograph. FINDINGS: Stable cardiomediastinal silhouette with top-normal heart size. No pneumothorax. No pleural effusion. Lungs appear clear, with no acute consolidative airspace disease and no pulmonary edema. IMPRESSION: No active cardiopulmonary disease. Electronically Signed   By: Ilona Sorrel M.D.   On: 02/11/2018 15:52   Ct Head Wo Contrast  Result Date: 02/11/2018 CLINICAL DATA:  Syncopal episode. Patient fell with subsequent confusion. Neck and back pain. EXAM: CT HEAD WITHOUT  CONTRAST TECHNIQUE: Contiguous axial images were obtained from the base of the skull through the vertex without intravenous contrast. COMPARISON:  CT head 07/28/2011. FINDINGS: Brain: There is no evidence of acute intracranial hemorrhage, mass lesion, brain edema or extra-axial fluid collection. The ventricles and subarachnoid spaces are appropriately sized for age. There is no CT evidence of acute cortical infarction. Vascular: Early intracranial vascular calcifications. No hyperdense vessel identified. Skull: Negative for fracture or focal lesion. Sinuses/Orbits: The visualized paranasal sinuses and mastoid air cells are clear. No orbital abnormalities are seen. Other: None. IMPRESSION: Stable unremarkable noncontrast head CT. No acute intracranial findings. Electronically Signed   By: Richardean Sale M.D.   On: 02/11/2018 16:50    Procedures Procedures (including critical care time)  Medications Ordered in ED Medications  irbesartan (AVAPRO) tablet 300 mg (300 mg Oral Given 02/11/18 1643)  hydrochlorothiazide (HYDRODIURIL) tablet 25 mg (25 mg Oral Given 02/11/18 1619)     Initial Impression / Assessment and Plan / ED Course  I have reviewed the triage vital signs and the nursing notes.  Pertinent labs & imaging results that were available during my care of the patient were reviewed by me and considered in my medical decision making (see chart for details).     5:07 PM On repeat exam patient is awake, alert, in no distress per We discussed all findings at length.  This elderly female presents after an episode of syncope and fall. Patient has a history of syncope, nor seizure, and given the unclear circumstances of the event, and her tongue laceration, there is consideration of seizure versus syncope. Here for evaluation reassuring, no intercranial mass, lesion, reassuring labs, x-ray. No evidence for sustained arrhythmia. Patient remained hemodynamically unremarkable, awake and alert  throughout hours of monitoring. Patient will follow-up with primary care for additional cardiac monitoring, evaluation and management.   Final Clinical Impressions(s) / ED Diagnoses   Final diagnoses:  Syncope and collapse     Carmin Muskrat, MD 02/11/18 [phone]

## 2018-02-11 NOTE — Discharge Instructions (Signed)
As discussed, your evaluation today has been largely reassuring.  But, it is important that you monitor your condition carefully, and do not hesitate to return to the ED if you develop new, or concerning changes in your condition. ? ?Otherwise, please follow-up with your physician for appropriate ongoing care. ? ?

## 2018-02-11 NOTE — ED Notes (Signed)
Patient transported to CT 

## 2018-02-19 ENCOUNTER — Other Ambulatory Visit: Payer: Self-pay | Admitting: Family Medicine

## 2018-02-19 DIAGNOSIS — R001 Bradycardia, unspecified: Secondary | ICD-10-CM

## 2018-02-19 DIAGNOSIS — R55 Syncope and collapse: Secondary | ICD-10-CM

## 2018-06-06 ENCOUNTER — Encounter: Payer: Self-pay | Admitting: Gynecology

## 2018-06-06 ENCOUNTER — Ambulatory Visit: Payer: BLUE CROSS/BLUE SHIELD | Admitting: Gynecology

## 2018-06-06 VITALS — BP 118/76

## 2018-06-06 DIAGNOSIS — N951 Menopausal and female climacteric states: Secondary | ICD-10-CM | POA: Diagnosis not present

## 2018-06-06 DIAGNOSIS — N644 Mastodynia: Secondary | ICD-10-CM

## 2018-06-06 MED ORDER — FLUOXETINE HCL 20 MG PO CAPS: 20.0000 mg | ORAL_CAPSULE | Freq: Every day | ORAL | 2 refills | Status: DC

## 2018-06-06 NOTE — Progress Notes (Signed)
    Tracey Buchanan 08/19/1956 [id number]        62 y.o.  G2P0020 presents with 2 issues:  1. Bilateral breast tenderness that comes and goes.  Has been going on for years.  No specific regions but more generalized bilaterally.  No palpable abnormalities on self breast exam.  No nipple discharge.  Mammography 09/2017 was normal with breast density category B. 2. Increased anxiety and hot flushes.  Has stopped working and is taking care of her mother.  Having more issues with anxiety.  Is on fluoxetine 10 mg daily.  Does note that sometimes she will take 20 mg daily but not consistently.  Having hot flushes and sweats.  No weight changes hair or skin changes.  Some nausea no vomiting.  No diarrhea/constipation.    Past medical history,surgical history, problem list, medications, allergies, family history and social history were all reviewed and documented in the EPIC chart.  Directed ROS with pertinent positives and negatives documented in the history of present illness/assessment and plan.  Exam: [name] assistant Vitals:   06/06/18 1423  BP: 118/76   General appearance:  Normal HEENT normal with normal palpable thyroid.  No cervical or supraclavicular adenopathy Both breasts examined lying and sitting without masses retractions discharge adenopathy.   Assessment/Plan:  62 y.o. G2P0020 with:  1. Bilateral breast mastalgia the comes and goes for the last several years.  Normal breast exam.  Reviewed with her that this most likely is physiologic.  Unlikely breast cancer given the bilaterality, coming and going and present for years.  Options for management to include more intensive diagnostic studies such as diagnostic mammography with or without ultrasound now versus continuing with annual mammography given this is been going on for years unchanged with no specific areas of concern.  Also discussed treatment options to include anti-inflammatories, heat/cold, androgen therapy such as  danazol/testosterone.  Patient is comfortable/not interested in pursuing any further diagnostic testing at this time.  She will do her mammogram in January when due.  We will hold on other treatments at this point.  Continue with self breast exams and call if any palpable abnormalities or persistent discomfort in any area. 2. Hot flushes and increased anxiety.  We reviewed possible etiologies to hot flushes and her anxiety.  Recommend thyroid panel to rule out thyroid dysfunction.  Increasing her fluoxetine to 20 mg daily consistently was also discussed which may address both the anxiety and hot flashes.  Patient will go ahead and start this and a prescription for #30 with 2 refills provided.  She will follow-up in December when due for annual exam we will see how she is doing.  Having some nausea which may be anxiety related.  If this persists then I recommended that she follow-up with gastroenterology.  I spent a total of 15 face-to-face minutes with the patient, over 50% was spent counseling and coordination of care.     Anastasio Auerbach MD, 2:51 PM 06/06/2018

## 2018-06-06 NOTE — Patient Instructions (Signed)
Start on the fluoxetine 20 mg daily.  Call if any issues.  Follow-up in December when due for annual exam.

## 2018-08-10 ENCOUNTER — Encounter: Payer: Self-pay | Admitting: Gynecology

## 2018-08-10 ENCOUNTER — Ambulatory Visit (INDEPENDENT_AMBULATORY_CARE_PROVIDER_SITE_OTHER): Payer: BLUE CROSS/BLUE SHIELD | Admitting: Gynecology

## 2018-08-10 VITALS — BP 138/80 | Ht 66.0 in | Wt 199.0 lb

## 2018-08-10 DIAGNOSIS — M8580 Other specified disorders of bone density and structure, unspecified site: Secondary | ICD-10-CM | POA: Diagnosis not present

## 2018-08-10 DIAGNOSIS — N952 Postmenopausal atrophic vaginitis: Secondary | ICD-10-CM

## 2018-08-10 DIAGNOSIS — Z01419 Encounter for gynecological examination (general) (routine) without abnormal findings: Secondary | ICD-10-CM

## 2018-08-10 DIAGNOSIS — F419 Anxiety disorder, unspecified: Secondary | ICD-10-CM | POA: Diagnosis not present

## 2018-08-10 MED ORDER — FLUOXETINE HCL 20 MG PO CAPS: 20.0000 mg | ORAL_CAPSULE | Freq: Every day | ORAL | 12 refills | Status: AC

## 2018-08-10 NOTE — Progress Notes (Signed)
    Tracey Buchanan Oct 19, 1955 [id number]        62 y.o.  G2P0020 for annual gynecologic exam.  Without gynecologic complaints  Past medical history,surgical history, problem list, medications, allergies, family history and social history were all reviewed and documented as reviewed in the EPIC chart.  ROS:  Performed with pertinent positives and negatives included in the history, assessment and plan.   Additional significant findings : None   Exam: Wandra Scot assistant Vitals:   08/10/18 0937  BP: 138/80  Weight: 199 lb (90.3 kg)  Height: 5\' 6"  (1.676 m)   Body mass index is 32.12 kg/m.  General appearance:  Normal affect, orientation and appearance. Skin: Grossly normal HEENT: Without gross lesions.  No cervical or supraclavicular adenopathy. Thyroid normal.  Lungs:  Clear without wheezing, rales or rhonchi Cardiac: RR, without RMG Abdominal:  Soft, nontender, without masses, guarding, rebound, organomegaly or hernia Breasts:  Examined lying and sitting without masses, retractions, discharge or axillary adenopathy. Pelvic:  Ext, BUS, Vagina: Normal with atrophic changes  Adnexa: Without masses or tenderness    Anus and perineum: Normal   Rectovaginal: Normal sphincter tone without palpated masses or tenderness.    Assessment/Plan:  62 y.o. G76P0020 female for annual gynecologic exam.   1. Postmenopausal.  Status post TAH 2001 for leiomyoma.  No significant menopausal symptoms. 2. History of VAIN 09/2010 with normal Pap smears afterwards.  Last Pap smear 08/2017.  No Pap smear done today. 3. Mammography coming due in January and I reminded her to schedule this.  Recently seen for mastalgia.  Symptoms are better.  Breast exam normal today.  SBE monthly reviewed. 4. Colonoscopy due and names and numbers provided to schedule.  Patient agrees to call and schedule. 5. Osteopenia.  DEXA 09/2017 T score -2.0 FRAX 4.5% / 0.9%.  Plan repeat DEXA next year at 2-year  interval. 6. Anxiety.  On fluoxetine 20 mg daily.  Transiently increased to 30 mg but would prefer the lower dose.  Refill x1 year provided.  Actively seeing a counselor. 7. Health maintenance.  No routine lab work done as patient does this elsewhere.  Follow-up 1 year, sooner as needed.   Anastasio Auerbach MD, 10:06 AM 08/10/2018

## 2018-08-10 NOTE — Patient Instructions (Signed)
Schedule your colonoscopy with either:  [facility]   Address: [address], [place], [place] [address]  [phone]    or  [facility]  Address: [address], [place], [place] [address]  [phone]      

## 2018-12-12 ENCOUNTER — Other Ambulatory Visit: Payer: Self-pay | Admitting: Gynecology

## 2019-05-11 IMAGING — CT CT HEAD W/O CM
4 series · 15 of 47 positions shown, 17 images · non-contrast
Comparison: CT head 07/28/2011.

CLINICAL DATA: Syncopal episode. Patient fell with subsequent
confusion. Neck and back pain.

EXAM:
CT HEAD WITHOUT CONTRAST
TECHNIQUE: Contiguous axial images were obtained from the base of the skull
through the vertex without intravenous contrast.

[Series 3: head without · axial · non-contrast · 0.45mm/px · z∈[-68,+62]mm · 7 of 36 slices shown, 9 images]
[im 5/36  brain]
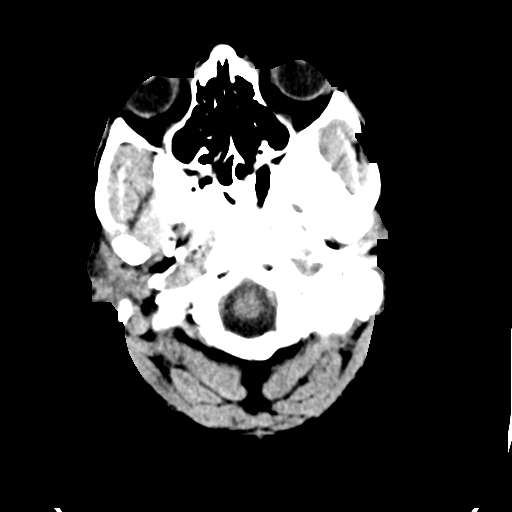
[im 5/36  bone]
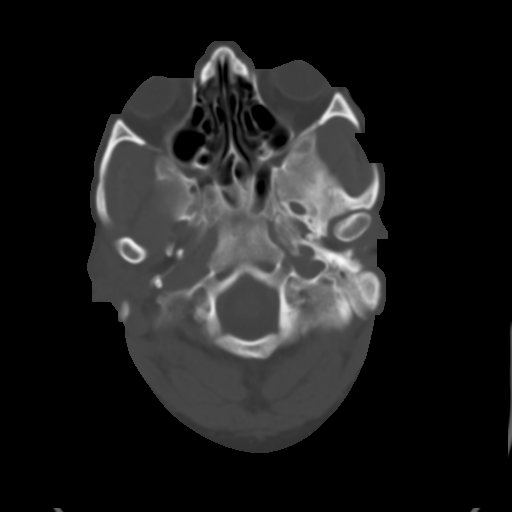
[im 9/36  brain]
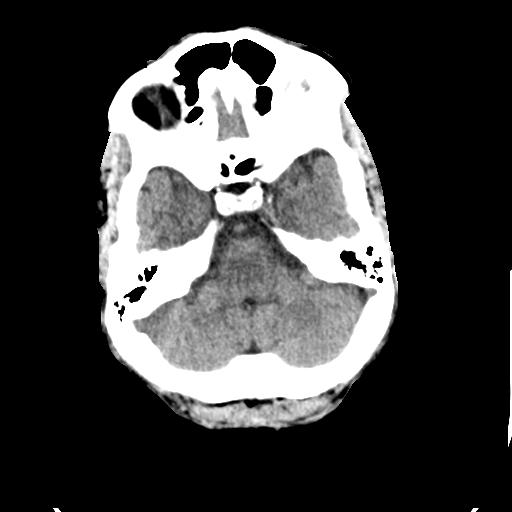
[im 14/36  brain]
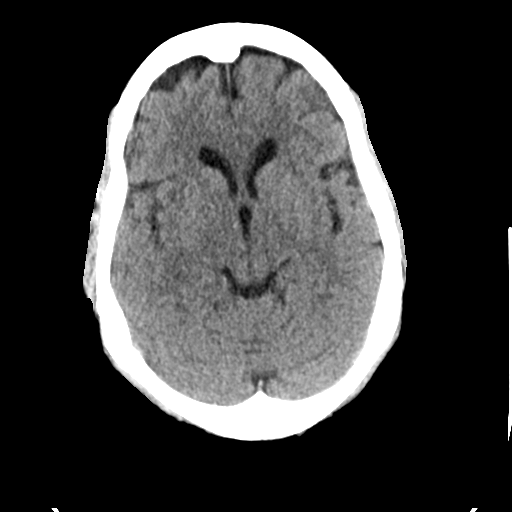
[im 18/36  brain]
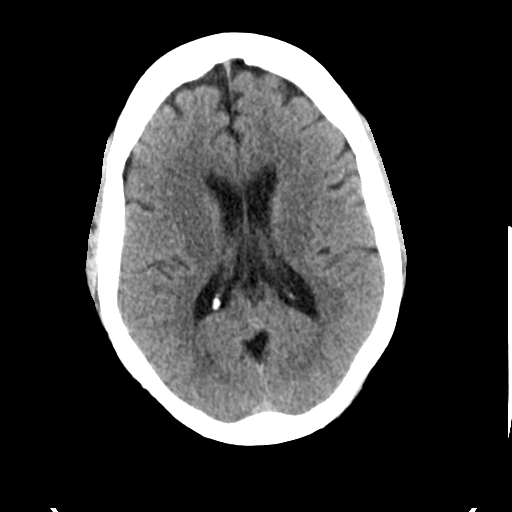
[im 22/36  brain]
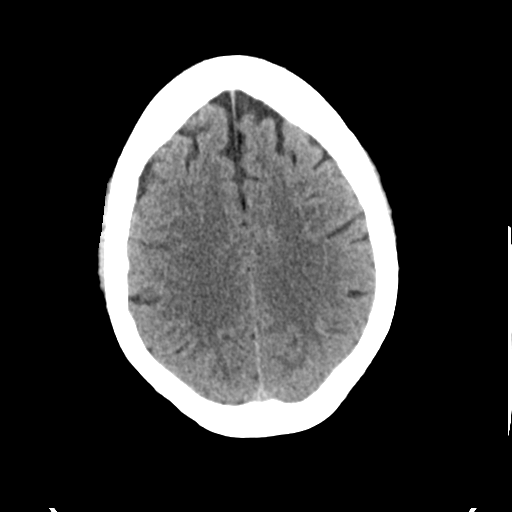
[im 22/36  bone]
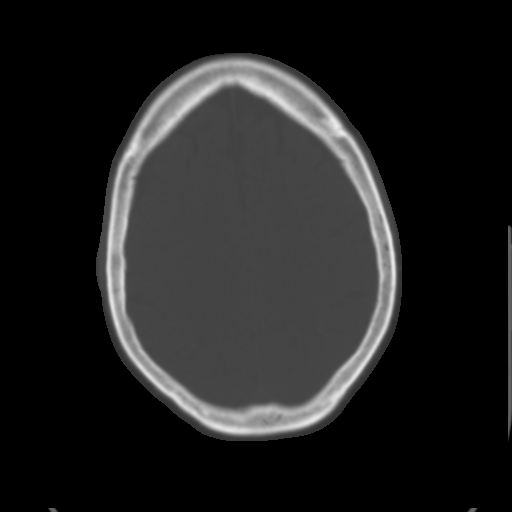
[im 27/36  brain]
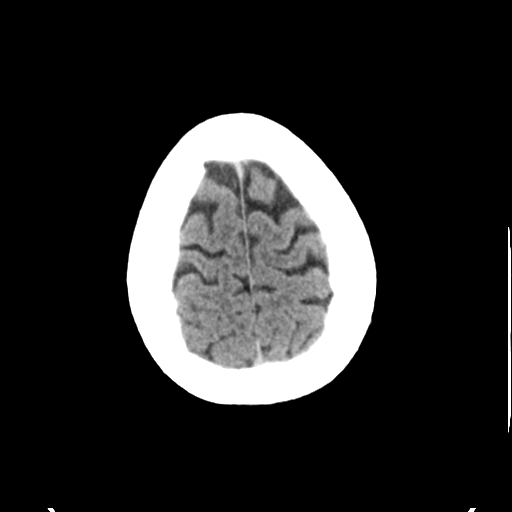
[im 31/36  brain]
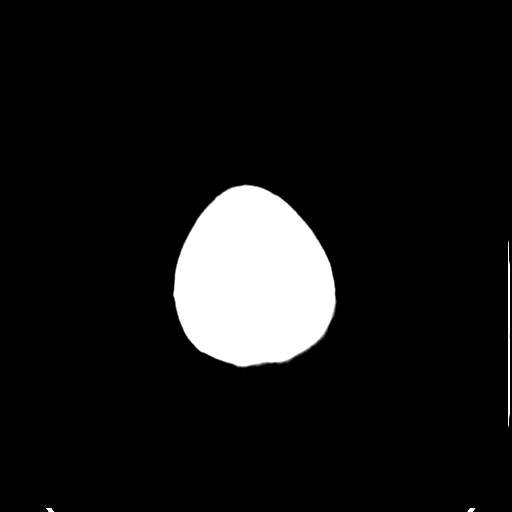

[Series 4: head bone · axial · 0.45mm/px · z∈[-72,-54]mm · 2 of 90 slices shown]
[im 9/90  bone]
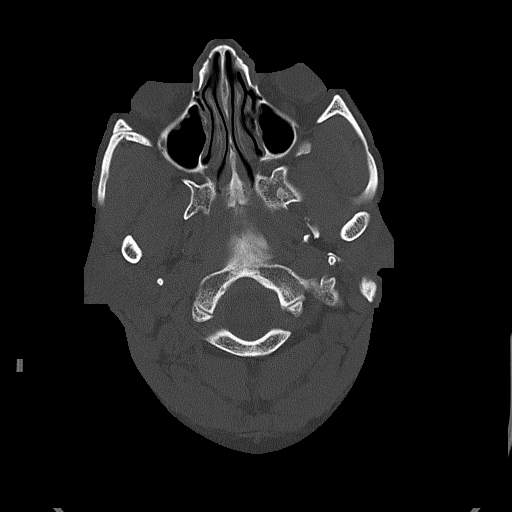
[im 18/90  bone]
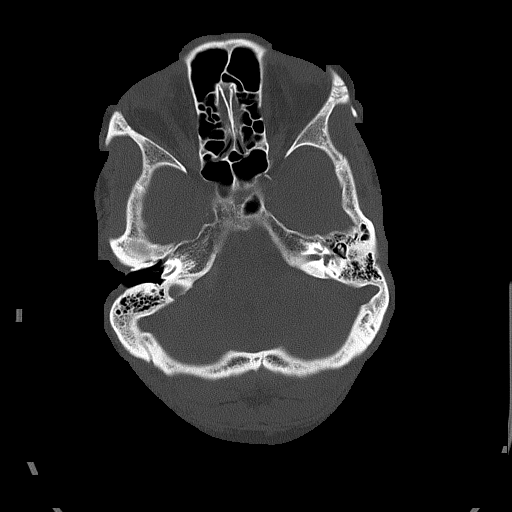

[Series 5: head without cor · coronal · non-contrast · 0.35mm/px · 3 of 70 slices shown]
[im 24/70  brain]
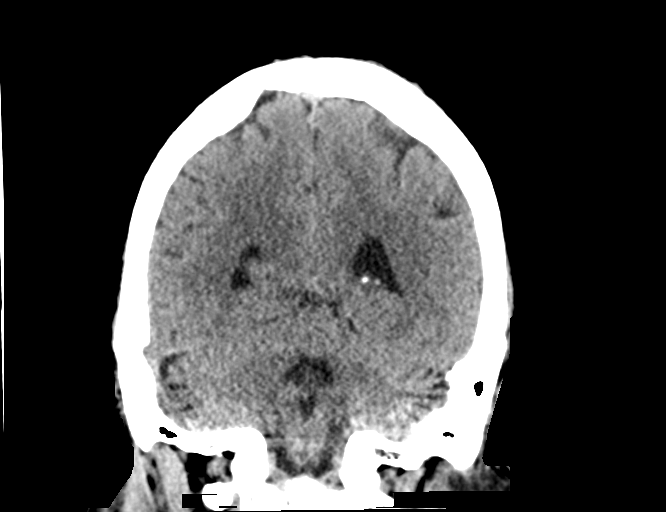
[im 31/70  brain]
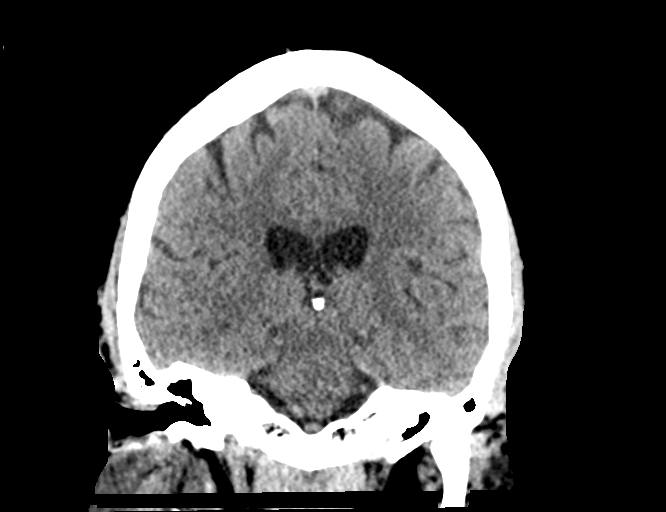
[im 39/70  brain]
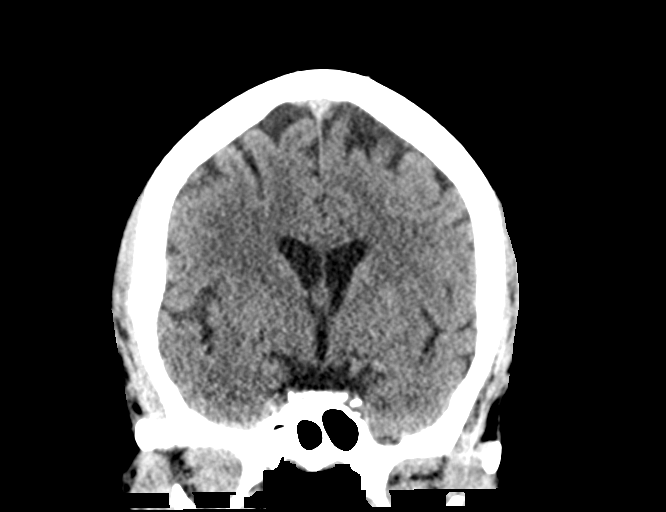

[Series 6: head without sag · sagittal · non-contrast · 0.35mm/px · 3 of 67 slices shown]
[im 23/67  brain]
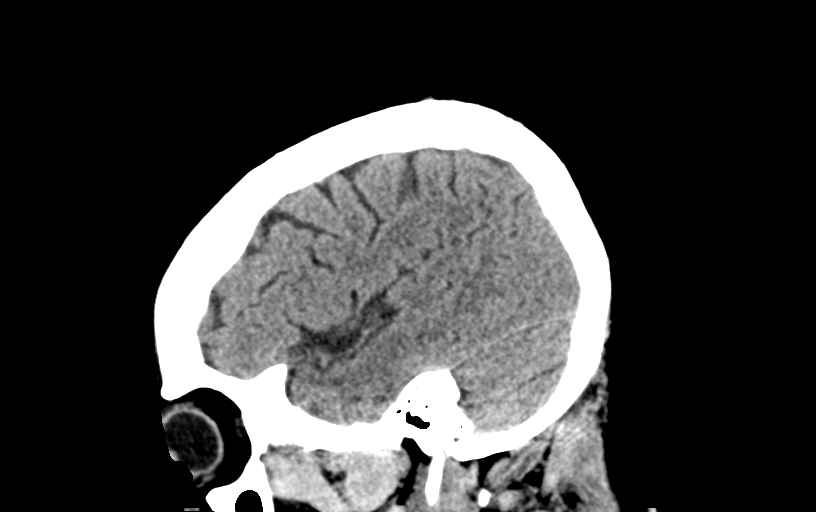
[im 34/67  brain]
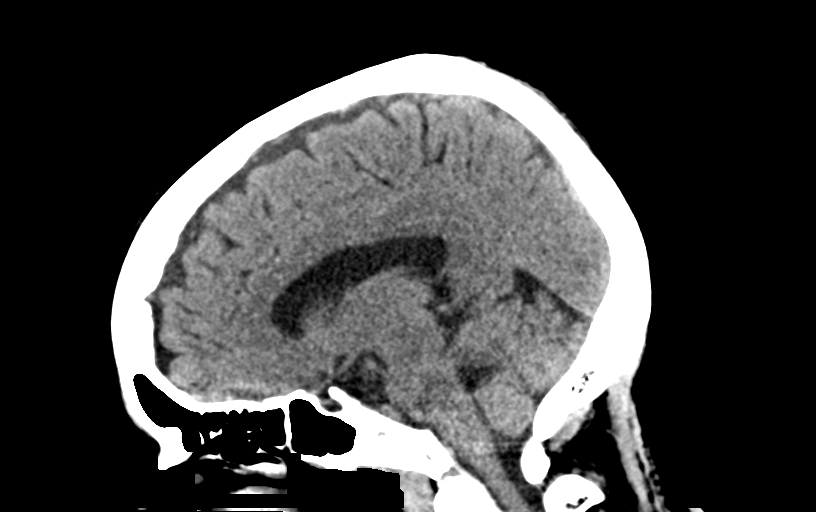
[im 45/67  brain]
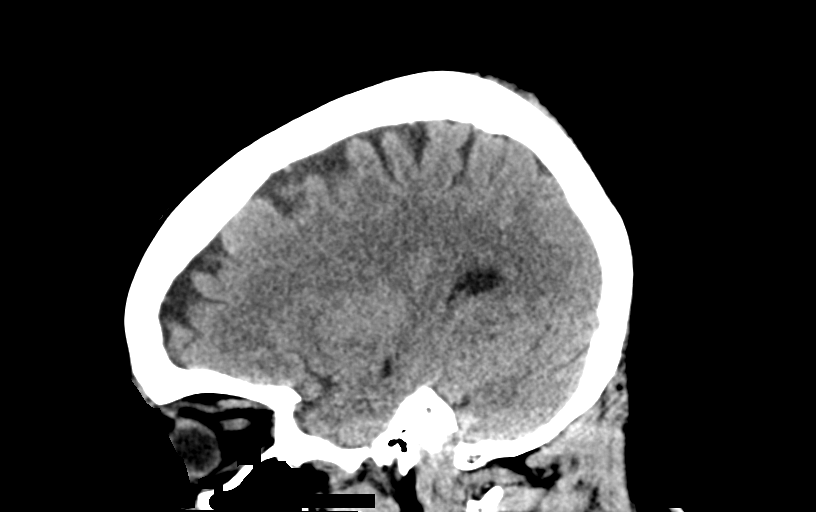

[15 of 47 positions shown; findings below may reference images not displayed]

FINDINGS: Brain: There is no evidence of acute intracranial hemorrhage, mass
lesion, brain edema or extra-axial fluid collection. The ventricles
and subarachnoid spaces are appropriately sized for age. There is no
CT evidence of acute cortical infarction.

Vascular: Early intracranial vascular calcifications. No hyperdense
vessel identified.

Skull: Negative for fracture or focal lesion.

Sinuses/Orbits: The visualized paranasal sinuses and mastoid air
cells are clear. No orbital abnormalities are seen.

Other: None.
IMPRESSION: Stable unremarkable noncontrast head CT. No acute intracranial
findings.

## 2019-06-04 ENCOUNTER — Encounter: Payer: Self-pay | Admitting: Gynecology

## 2019-07-16 ENCOUNTER — Other Ambulatory Visit: Payer: Self-pay

## 2019-07-16 DIAGNOSIS — Z20822 Contact with and (suspected) exposure to covid-19: Secondary | ICD-10-CM

## 2019-07-17 LAB — NOVEL CORONAVIRUS, NAA: SARS-CoV-2, NAA: NOT DETECTED

## 2019-09-03 ENCOUNTER — Other Ambulatory Visit: Payer: Self-pay

## 2019-09-04 ENCOUNTER — Encounter: Payer: BLUE CROSS/BLUE SHIELD | Admitting: Gynecology

## 2019-10-16 ENCOUNTER — Encounter: Payer: BLUE CROSS/BLUE SHIELD | Admitting: Obstetrics and Gynecology

## 2019-11-19 ENCOUNTER — Encounter: Payer: Self-pay | Admitting: Obstetrics and Gynecology

## 2019-11-28 ENCOUNTER — Ambulatory Visit: Payer: 59

## 2019-12-18 ENCOUNTER — Other Ambulatory Visit: Payer: Self-pay

## 2019-12-19 ENCOUNTER — Encounter: Payer: Self-pay | Admitting: Obstetrics and Gynecology

## 2019-12-19 ENCOUNTER — Ambulatory Visit (INDEPENDENT_AMBULATORY_CARE_PROVIDER_SITE_OTHER): Payer: 59 | Admitting: Obstetrics and Gynecology

## 2019-12-19 VITALS — Ht 66.0 in | Wt 198.0 lb

## 2019-12-19 DIAGNOSIS — Z1272 Encounter for screening for malignant neoplasm of vagina: Secondary | ICD-10-CM

## 2019-12-19 DIAGNOSIS — F419 Anxiety disorder, unspecified: Secondary | ICD-10-CM

## 2019-12-19 DIAGNOSIS — Z01419 Encounter for gynecological examination (general) (routine) without abnormal findings: Secondary | ICD-10-CM

## 2019-12-19 DIAGNOSIS — M8580 Other specified disorders of bone density and structure, unspecified site: Secondary | ICD-10-CM | POA: Diagnosis not present

## 2019-12-19 MED ORDER — FLUOXETINE HCL 10 MG PO CAPS: 30.0000 mg | ORAL_CAPSULE | Freq: Every day | ORAL | 11 refills | Status: DC

## 2019-12-19 NOTE — Patient Instructions (Signed)
We will plan to repeat the DEXA/bone density scan now, please schedule soon as possible.  Continue weight bearing exercise, and vitamin D/calcium intake. Please remember to schedule a colonoscopy for routine colon cancer screening (call one of the local GI groups in town) as soon as you can.

## 2019-12-19 NOTE — Addendum Note (Signed)
Addended by: Nelva Nay on: 12/19/2019 11:44 AM   Modules accepted: Orders

## 2019-12-19 NOTE — Progress Notes (Signed)
Tracey Buchanan August 09, 1956 [id number]  SUBJECTIVE:  64 y.o. G2P0020 female for annual routine gynecologic exam and Pap smear. She has no gynecologic concerns.  Current Outpatient Medications  Medication Sig Dispense Refill  . FLUoxetine (PROZAC) 20 MG capsule Take 1 capsule (20 mg total) by mouth daily. 30 capsule 12  . ibuprofen (ADVIL,MOTRIN) 800 MG tablet Take 1 tablet (800 mg total) by mouth every 8 (eight) hours as needed. (Patient taking differently: Take 400 mg by mouth every 8 (eight) hours as needed for mild pain. ) 30 tablet 1  . LORazepam (ATIVAN) 0.5 MG tablet Take 1 tablet (0.5 mg total) by mouth every 8 (eight) hours. For anxiety (Patient taking differently: Take 0.5 mg by mouth every 4 (four) hours as needed for anxiety. For anxiety) 30 tablet 2  . olmesartan-hydrochlorothiazide (BENICAR HCT) 40-25 MG per tablet Take 1 tablet by mouth daily.       No current facility-administered medications for this visit.   Allergies: Cymbalta [duloxetine hcl], Effexor [venlafaxine], Flexeril [cyclobenzaprine], Prevacid [lansoprazole], Prilosec [omeprazole], Wellbutrin xl [bupropion], and Codeine  No LMP recorded. Patient has had a hysterectomy.  Past medical history,surgical history, problem list, medications, allergies, family history and social history were all reviewed and documented as reviewed in the EPIC chart.  ROS:  Feeling well. No dyspnea or chest pain on exertion.  No abdominal pain, change in bowel habits, black or bloody stools.  No urinary tract symptoms. GYN ROS:  no abnormal bleeding, pelvic pain or discharge, no breast pain or new or enlarging lumps on self exam. No neurological complaints.   OBJECTIVE:  Ht 5\' 6"  (1.676 m)   Wt 198 lb (89.8 kg)   BMI 31.96 kg/m  The patient appears well, alert, oriented x 3, in no distress. ENT normal.  Neck supple. No cervical or supraclavicular adenopathy or thyromegaly.  Lungs are clear, good air entry, no wheezes, rhonchi or  rales. S1 and S2 normal, no murmurs, regular rate and rhythm.  Abdomen soft without tenderness, guarding, mass or organomegaly.  Neurological is normal, no focal findings.  BREAST EXAM: breasts appear normal, no suspicious masses, no skin or nipple changes or axillary nodes  PELVIC EXAM: VULVA: normal appearing vulva with no masses, tenderness or lesions, VAGINA: normal appearing vagina with normal color and discharge, no lesions, CERVIX: Surgically absent, UTERUS: Small cuff appears normal, ADNEXA: normal adnexa, no masses or tenderness,  PAP: Pap smear done today, thin-prep method  Chaperone: [name] present during the examination  ASSESSMENT:  64 y.o. G2P0020 here for annual gynecologic exam  PLAN:   1.  Postmenopausal.  Previous TAH in 2001 for leiomyoma.  No significant menopausal symptoms. 2.  History of VAIN 09/2010, normal Pap smears since then.  Pap smear last 08/2017.  Pap smear of vaginal cuff is obtained today. 3. Mammogram 09/2017.  Normal breast exam today.  She is reminded to schedule an annual mammogram soon as this is overdue.  She will probably choose to get her COVID-19 vaccination before the mammogram so I recommended that she discuss with scheduling the study regarding timing after getting the shot. 4. Colonoscopy is due.  Recommended that she do a colonoscopy as soon as possible and references are provided. 5. DEXA 09/2017, T score -2.0, FRAX 4.5% / 0.9%.  Repeat DEXA is recommended now and this is ordered.  We will plan to get this scheduled. 6.  Anxiety.  Slightly flared up recently.  She is still seeing a Social worker.  Has a lot  going on in her life take care of her mother and recent passing of loved ones in the past few years.  Discussed adjusting fluoxetine dose and she would like to try the 30 mg dose again which she understands she has tried before but she feels more ready to do this now.  Prescription sent for fluoxetine 30 mg. 7. Health maintenance.  No labs today  as she normally has these completed with her primary care provider.   Return annually or sooner, prn.  Joseph Pierini MD, FACOG  12/19/19

## 2019-12-23 LAB — PAP IG W/ RFLX HPV ASCU

## 2020-04-15 ENCOUNTER — Other Ambulatory Visit: Payer: Self-pay | Admitting: Obstetrics and Gynecology

## 2020-04-15 DIAGNOSIS — Z1231 Encounter for screening mammogram for malignant neoplasm of breast: Secondary | ICD-10-CM

## 2020-05-04 ENCOUNTER — Ambulatory Visit: Payer: 59

## 2020-05-15 ENCOUNTER — Ambulatory Visit: Payer: Self-pay

## 2020-05-28 ENCOUNTER — Other Ambulatory Visit: Payer: Self-pay

## 2020-05-28 ENCOUNTER — Ambulatory Visit
Admission: RE | Admit: 2020-05-28 | Discharge: 2020-05-28 | Disposition: A | Discharge status: Home / Self Care | Payer: 59 | Source: Ambulatory Visit | Attending: Obstetrics and Gynecology | Admitting: Obstetrics and Gynecology

## 2020-05-28 DIAGNOSIS — Z1231 Encounter for screening mammogram for malignant neoplasm of breast: Secondary | ICD-10-CM

## 2020-08-19 DIAGNOSIS — F4322 Adjustment disorder with anxiety: Secondary | ICD-10-CM | POA: Diagnosis not present

## 2020-10-01 DIAGNOSIS — M546 Pain in thoracic spine: Secondary | ICD-10-CM | POA: Diagnosis not present

## 2020-10-01 DIAGNOSIS — M25561 Pain in right knee: Secondary | ICD-10-CM | POA: Diagnosis not present

## 2020-10-28 DIAGNOSIS — F17210 Nicotine dependence, cigarettes, uncomplicated: Secondary | ICD-10-CM | POA: Diagnosis not present

## 2020-10-28 DIAGNOSIS — L538 Other specified erythematous conditions: Secondary | ICD-10-CM | POA: Diagnosis not present

## 2020-10-28 DIAGNOSIS — I10 Essential (primary) hypertension: Secondary | ICD-10-CM | POA: Diagnosis not present

## 2020-10-28 DIAGNOSIS — T7840XA Allergy, unspecified, initial encounter: Secondary | ICD-10-CM | POA: Diagnosis not present

## 2020-11-05 DIAGNOSIS — M1712 Unilateral primary osteoarthritis, left knee: Secondary | ICD-10-CM | POA: Diagnosis not present

## 2020-11-05 DIAGNOSIS — M1711 Unilateral primary osteoarthritis, right knee: Secondary | ICD-10-CM | POA: Diagnosis not present

## 2020-11-05 DIAGNOSIS — M170 Bilateral primary osteoarthritis of knee: Secondary | ICD-10-CM | POA: Diagnosis not present

## 2020-12-07 DIAGNOSIS — F4322 Adjustment disorder with anxiety: Secondary | ICD-10-CM | POA: Diagnosis not present

## 2021-02-18 DIAGNOSIS — F4322 Adjustment disorder with anxiety: Secondary | ICD-10-CM | POA: Diagnosis not present

## 2021-03-05 ENCOUNTER — Other Ambulatory Visit: Payer: Self-pay

## 2021-03-05 MED ORDER — FLUOXETINE HCL 10 MG PO CAPS: 30.0000 mg | ORAL_CAPSULE | Freq: Every day | ORAL | 0 refills | Status: DC

## 2021-03-05 NOTE — Telephone Encounter (Signed)
Medication refill request: fluoxetine 10mg  takes 3 (30mg ) Last AEX:  12-19-19 Next AEX: not scheduled Last MMG (if hormonal medication request): n/a Refill authorized: pharmacy note for patient to call to schedule yearly exam for further refills. Please approve if appropriate

## 2021-03-30 DIAGNOSIS — H5213 Myopia, bilateral: Secondary | ICD-10-CM | POA: Diagnosis not present

## 2021-03-30 DIAGNOSIS — H524 Presbyopia: Secondary | ICD-10-CM | POA: Diagnosis not present

## 2021-03-30 DIAGNOSIS — H52209 Unspecified astigmatism, unspecified eye: Secondary | ICD-10-CM | POA: Diagnosis not present

## 2021-04-22 DIAGNOSIS — F4322 Adjustment disorder with anxiety: Secondary | ICD-10-CM | POA: Diagnosis not present

## 2021-04-23 DIAGNOSIS — E785 Hyperlipidemia, unspecified: Secondary | ICD-10-CM | POA: Diagnosis not present

## 2021-04-23 DIAGNOSIS — I10 Essential (primary) hypertension: Secondary | ICD-10-CM | POA: Diagnosis not present

## 2021-04-23 DIAGNOSIS — F411 Generalized anxiety disorder: Secondary | ICD-10-CM | POA: Diagnosis not present

## 2021-04-23 DIAGNOSIS — F330 Major depressive disorder, recurrent, mild: Secondary | ICD-10-CM | POA: Diagnosis not present

## 2021-04-23 DIAGNOSIS — Z0000 Encounter for general adult medical examination without abnormal findings: Secondary | ICD-10-CM | POA: Diagnosis not present

## 2021-04-23 DIAGNOSIS — F17210 Nicotine dependence, cigarettes, uncomplicated: Secondary | ICD-10-CM | POA: Diagnosis not present

## 2021-04-23 DIAGNOSIS — Z1211 Encounter for screening for malignant neoplasm of colon: Secondary | ICD-10-CM | POA: Diagnosis not present

## 2021-04-23 DIAGNOSIS — M8588 Other specified disorders of bone density and structure, other site: Secondary | ICD-10-CM | POA: Diagnosis not present

## 2021-04-23 DIAGNOSIS — M1990 Unspecified osteoarthritis, unspecified site: Secondary | ICD-10-CM | POA: Diagnosis not present

## 2021-05-04 ENCOUNTER — Other Ambulatory Visit: Payer: Self-pay | Admitting: *Deleted

## 2021-05-04 DIAGNOSIS — F17210 Nicotine dependence, cigarettes, uncomplicated: Secondary | ICD-10-CM

## 2021-05-04 DIAGNOSIS — Z87891 Personal history of nicotine dependence: Secondary | ICD-10-CM

## 2021-05-26 ENCOUNTER — Other Ambulatory Visit: Payer: Self-pay | Admitting: Obstetrics and Gynecology

## 2021-05-26 DIAGNOSIS — Z1231 Encounter for screening mammogram for malignant neoplasm of breast: Secondary | ICD-10-CM

## 2021-05-31 ENCOUNTER — Ambulatory Visit: Payer: Self-pay

## 2021-06-02 ENCOUNTER — Ambulatory Visit
Admission: RE | Admit: 2021-06-02 | Discharge: 2021-06-02 | Disposition: A | Discharge status: Home / Self Care | Payer: Medicare HMO | Source: Ambulatory Visit | Attending: Acute Care | Admitting: Acute Care

## 2021-06-02 ENCOUNTER — Telehealth (INDEPENDENT_AMBULATORY_CARE_PROVIDER_SITE_OTHER): Payer: Medicare HMO | Admitting: Acute Care

## 2021-06-02 ENCOUNTER — Other Ambulatory Visit: Payer: Self-pay

## 2021-06-02 ENCOUNTER — Encounter: Payer: Self-pay | Admitting: Acute Care

## 2021-06-02 DIAGNOSIS — F17210 Nicotine dependence, cigarettes, uncomplicated: Secondary | ICD-10-CM

## 2021-06-02 DIAGNOSIS — Z87891 Personal history of nicotine dependence: Secondary | ICD-10-CM

## 2021-06-02 NOTE — Patient Instructions (Signed)
Thank you for participating in the [facility]. It was our pleasure to meet you today. We will call you with the results of your scan within the next few days. Your scan will be assigned a Lung RADS category score by the physicians reading the scans.  This Lung RADS score determines follow up scanning.  See below for description of categories, and follow up screening recommendations. We will be in touch to schedule your follow up screening annually or based on recommendations of our providers. We will fax a copy of your scan results to your Primary Care Physician, or the physician who referred you to the program, to ensure they have the results. Please call the office if you have any questions or concerns regarding your scanning experience or results.  Our office number is [phone]. Please speak with [name] Phelps, RN. She is our Lung Cancer Screening RN. If she is unavailable when you call, please have the office staff send her a message. She will return your call at her earliest convenience. Remember, if your scan is normal, we will scan you annually as long as you continue to meet the criteria for the program. (Age 55-77, Current smoker or smoker who has quit within the last 15 years). If you are a smoker, remember, quitting is the single most powerful action that you can take to decrease your risk of lung cancer and other pulmonary, breathing related problems. We know quitting is hard, and we are here to help.  Please let us know if there is anything we can do to help you meet your goal of quitting. If you are a former smoker, congratulations. We are proud of you! Remain smoke free! Remember you can refer friends or family members through the number above.  We will screen them to make sure they meet criteria for the program. Thank you for helping us take better care of you by participating in Lung Screening.  Lung RADS Categories:  Lung RADS 1: no nodules  or definitely non-concerning nodules.  Recommendation is for a repeat annual scan in 12 months.  Lung RADS 2:  nodules that are non-concerning in appearance and behavior with a very low likelihood of becoming an active cancer. Recommendation is for a repeat annual scan in 12 months.  Lung RADS 3: nodules that are probably non-concerning , includes nodules with a low likelihood of becoming an active cancer.  Recommendation is for a 6-month repeat screening scan. Often noted after an upper respiratory illness. We will be in touch to make sure you have no questions, and to schedule your 6-month scan.  Lung RADS 4 A: nodules with concerning findings, recommendation is most often for a follow up scan in 3 months or additional testing based on our provider's assessment of the scan. We will be in touch to make sure you have no questions and to schedule the recommended 3 month follow up scan.  Lung RADS 4 B:  indicates findings that are concerning. We will be in touch with you to schedule additional diagnostic testing based on our provider's  assessment of the scan.   

## 2021-06-02 NOTE — Progress Notes (Signed)
Virtual Visit via Video Note  I connected with Tracey Buchanan on 06/02/21 at 10:00 AM EDT by a video enabled telemedicine application and verified that I am speaking with the correct person using two identifiers.  Location: Patient: At home Provider: [place], [place], [place], [address]    I discussed the limitations of evaluation and management by telemedicine and the availability of in person appointments. The patient expressed understanding and agreed to proceed.  Shared Decision Making Visit Lung Cancer Screening Program [phone])   Eligibility: Age 65 y.o. Pack Years Smoking History Calculation 45 pack year smoking history (# packs/per year x # years smoked) Recent History of coughing up blood  no Unexplained weight loss? no ( >Than 15 pounds within the last 6 months ) Prior History Lung / other cancer no (Diagnosis within the last 5 years already requiring surveillance chest CT Scans). Smoking Status Current Smoker Former Smokers: Years since quit: NA  Quit Date: NA  Visit Components: Discussion included one or more decision making aids. yes Discussion included risk/benefits of screening. yes Discussion included potential follow up diagnostic testing for abnormal scans. yes Discussion included meaning and risk of over diagnosis. yes Discussion included meaning and risk of False Positives. yes Discussion included meaning of total radiation exposure. yes  Counseling Included: Importance of adherence to annual lung cancer LDCT screening. yes Impact of comorbidities on ability to participate in the program. yes Ability and willingness to under diagnostic treatment. yes  Smoking Cessation Counseling: Current Smokers:  Discussed importance of smoking cessation. yes Information about tobacco cessation classes and interventions provided to patient. yes Patient provided with "ticket" for LDCT Scan. yes Symptomatic Patient. no  Counseling NA Diagnosis Code:  Tobacco Use Z72.0 Asymptomatic Patient yes  Counseling (Intermediate counseling: > three minutes counseling) [id number] Former Smokers:  Discussed the importance of maintaining cigarette abstinence. yes Diagnosis Code: Personal History of Nicotine Dependence. T41.962 Information about tobacco cessation classes and interventions provided to patient. Yes Patient provided with "ticket" for LDCT Scan. yes Written Order for Lung Cancer Screening with LDCT placed in Epic. Yes (CT Chest Lung Cancer Screening Low Dose W/O CM) [id number] Z12.2-Screening of respiratory organs Z87.891-Personal history of nicotine dependence  I have spent 25 minutes of face to face time with Tracey Buchanan  discussing the risks and benefits of lung cancer screening. We viewed a power point together that explained in detail the above noted topics. We paused at intervals to allow for questions to be asked and answered to ensure understanding.We discussed that the single most powerful action that she can take to decrease her risk of developing lung cancer is to quit smoking. We discussed whether or not she is ready to commit to setting a quit date. We discussed options for tools to aid in quitting smoking including nicotine replacement therapy, non-nicotine medications, support groups, Quit Smart classes, and behavior modification. We discussed that often times setting smaller, more achievable goals, such as eliminating 1 cigarette a day for a week and then 2 cigarettes a day for a week can be helpful in slowly decreasing the number of cigarettes smoked. This allows for a sense of accomplishment as well as providing a clinical benefit. I gave her the " Be Stronger Than Your Excuses" card with contact information for community resources, classes, free nicotine replacement therapy, and access to mobile apps, text messaging, and on-line smoking cessation help. I have also given her my card and contact information in the event she needs to  contact me. We discussed the time and location of the scan, and that either Doroteo Glassman RN or I will call with the results within 24-48 hours of receiving them. I have offered her  a copy of the power point we viewed  as a resource in the event they need reinforcement of the concepts we discussed today in the office. The patient verbalized understanding of all of  the above and had no further questions upon leaving the office. They have my contact information in the event they have any further questions.  I spent 3 minutes counseling on smoking cessation and the health risks of continued tobacco abuse.  I explained to the patient that there has been a high incidence of coronary artery disease noted on these exams. I explained that this is a non-gated exam therefore degree or severity cannot be determined. This patient is on statin therapy. I have asked the patient to follow-up with their PCP regarding any incidental finding of coronary artery disease and management with diet or medication as their PCP  feels is clinically indicated. The patient verbalized understanding of the above and had no further questions upon completion of the visit.      Magdalen Spatz, NP 06/02/2021

## 2021-06-10 DIAGNOSIS — F4322 Adjustment disorder with anxiety: Secondary | ICD-10-CM | POA: Diagnosis not present

## 2021-06-16 ENCOUNTER — Telehealth: Payer: Self-pay | Admitting: Acute Care

## 2021-06-16 NOTE — Telephone Encounter (Signed)
I called the pt and she is aware that she can call that number and they will send her the cessation type that she requests.  Pt stated that she was told that she was going to be emailed this information.  She stated that if any other information comes up, SG has her email and she can just send it to her.

## 2021-06-16 NOTE — Telephone Encounter (Signed)
Call returned to patient, confirmed DOB. Patient states after her video visit with SG she was told she was going to get some type of kit to help her stop smoking or some type of smoking aide. She is requesting a nicotine patch or whatever is recommended and anything that comes along with the kit.   SG please advise.

## 2021-06-24 ENCOUNTER — Other Ambulatory Visit: Payer: Self-pay | Admitting: *Deleted

## 2021-06-24 DIAGNOSIS — Z87891 Personal history of nicotine dependence: Secondary | ICD-10-CM

## 2021-06-24 DIAGNOSIS — F17210 Nicotine dependence, cigarettes, uncomplicated: Secondary | ICD-10-CM

## 2021-06-24 NOTE — Progress Notes (Signed)
I have called the patient with the results of her low dose CT. I explained that she has 2 nodules that fall into the 6-8 mm range that we would like to look at again in 6 months to ensure they are stable. She is in agreement with his.  I have also advised her on using nicotine replacement therapy to try to decrease the number of cigarettes she smokes on a daily basis.  [name], LR 3,  6 month follow up Minimal calcifications of the thoracic aorta.  Please fax results to PCP. Thanks so much

## 2021-06-30 DIAGNOSIS — J439 Emphysema, unspecified: Secondary | ICD-10-CM | POA: Diagnosis not present

## 2021-07-20 DIAGNOSIS — F4322 Adjustment disorder with anxiety: Secondary | ICD-10-CM | POA: Diagnosis not present

## 2021-09-27 DIAGNOSIS — R519 Headache, unspecified: Secondary | ICD-10-CM | POA: Diagnosis not present

## 2021-09-27 DIAGNOSIS — I10 Essential (primary) hypertension: Secondary | ICD-10-CM | POA: Diagnosis not present

## 2021-09-27 DIAGNOSIS — M654 Radial styloid tenosynovitis [de Quervain]: Secondary | ICD-10-CM | POA: Diagnosis not present

## 2021-09-27 DIAGNOSIS — H8113 Benign paroxysmal vertigo, bilateral: Secondary | ICD-10-CM | POA: Diagnosis not present

## 2021-10-10 DIAGNOSIS — J029 Acute pharyngitis, unspecified: Secondary | ICD-10-CM | POA: Diagnosis not present

## 2021-10-10 DIAGNOSIS — J069 Acute upper respiratory infection, unspecified: Secondary | ICD-10-CM | POA: Diagnosis not present

## 2021-10-10 DIAGNOSIS — R0982 Postnasal drip: Secondary | ICD-10-CM | POA: Diagnosis not present

## 2021-10-18 DIAGNOSIS — E785 Hyperlipidemia, unspecified: Secondary | ICD-10-CM | POA: Diagnosis not present

## 2021-10-18 DIAGNOSIS — J439 Emphysema, unspecified: Secondary | ICD-10-CM | POA: Diagnosis not present

## 2021-10-18 DIAGNOSIS — J069 Acute upper respiratory infection, unspecified: Secondary | ICD-10-CM | POA: Diagnosis not present

## 2021-10-18 DIAGNOSIS — I10 Essential (primary) hypertension: Secondary | ICD-10-CM | POA: Diagnosis not present

## 2021-10-18 DIAGNOSIS — F329 Major depressive disorder, single episode, unspecified: Secondary | ICD-10-CM | POA: Diagnosis not present

## 2021-11-04 ENCOUNTER — Other Ambulatory Visit: Payer: Self-pay | Admitting: Family Medicine

## 2021-11-04 DIAGNOSIS — Z1231 Encounter for screening mammogram for malignant neoplasm of breast: Secondary | ICD-10-CM

## 2021-11-10 DIAGNOSIS — J439 Emphysema, unspecified: Secondary | ICD-10-CM | POA: Diagnosis not present

## 2021-11-10 DIAGNOSIS — F17210 Nicotine dependence, cigarettes, uncomplicated: Secondary | ICD-10-CM | POA: Diagnosis not present

## 2021-11-10 DIAGNOSIS — M778 Other enthesopathies, not elsewhere classified: Secondary | ICD-10-CM | POA: Diagnosis not present

## 2021-11-10 DIAGNOSIS — I700 Atherosclerosis of aorta: Secondary | ICD-10-CM | POA: Diagnosis not present

## 2021-11-10 DIAGNOSIS — I10 Essential (primary) hypertension: Secondary | ICD-10-CM | POA: Diagnosis not present

## 2021-11-10 DIAGNOSIS — E785 Hyperlipidemia, unspecified: Secondary | ICD-10-CM | POA: Diagnosis not present

## 2021-11-11 ENCOUNTER — Ambulatory Visit
Admission: RE | Admit: 2021-11-11 | Discharge: 2021-11-11 | Disposition: A | Discharge status: Home / Self Care | Payer: Medicare HMO | Source: Ambulatory Visit | Attending: Family Medicine | Admitting: Family Medicine

## 2021-11-11 ENCOUNTER — Ambulatory Visit: Payer: Medicare HMO

## 2021-11-11 DIAGNOSIS — Z1231 Encounter for screening mammogram for malignant neoplasm of breast: Secondary | ICD-10-CM | POA: Diagnosis not present

## 2021-11-23 ENCOUNTER — Other Ambulatory Visit: Payer: Self-pay

## 2021-11-23 NOTE — Patient Outreach (Signed)
Aging Gracefully Program ? ?11/23/2021 ? ?Tracey Buchanan ?04-27-1956 ?[id number] ? ?[facility] Evaluation Interviewer attempted to call patient on today regarding Aging Gracefully referral. Patient answered the phone then hung up twice. CMA unable to leave confidential voicemail for patient to return call. ? ?Will attempt to call back by end of the week.  ? ?[name] ?Care Management Assistant ?[phone]  ? ?

## 2021-11-30 ENCOUNTER — Other Ambulatory Visit: Payer: Self-pay

## 2021-11-30 NOTE — Patient Outreach (Signed)
Aging Gracefully Program ? ?11/30/2021 ? ?EVALYSE STROOPE ?[birth year] ?[id number] ? ? ?[facility] Evaluation Interviewer made contact with patient. Aging Gracefully initial survey completed.  ? ?Interviewer will send referral to OT and [name], RN for follow up. ? ?[name] ?Care Management Assistant  ?[phone] ?

## 2021-12-10 ENCOUNTER — Other Ambulatory Visit: Payer: Medicare HMO

## 2021-12-24 DIAGNOSIS — M170 Bilateral primary osteoarthritis of knee: Secondary | ICD-10-CM | POA: Diagnosis not present

## 2021-12-24 DIAGNOSIS — F17210 Nicotine dependence, cigarettes, uncomplicated: Secondary | ICD-10-CM | POA: Diagnosis not present

## 2021-12-24 DIAGNOSIS — I10 Essential (primary) hypertension: Secondary | ICD-10-CM | POA: Diagnosis not present

## 2021-12-26 ENCOUNTER — Emergency Department (HOSPITAL_BASED_OUTPATIENT_CLINIC_OR_DEPARTMENT_OTHER)
Admission: EM | Admit: 2021-12-26 | Discharge: 2021-12-26 | Disposition: A | Discharge status: Home / Self Care | Payer: Medicare HMO | Attending: Emergency Medicine | Admitting: Emergency Medicine

## 2021-12-26 ENCOUNTER — Emergency Department (HOSPITAL_BASED_OUTPATIENT_CLINIC_OR_DEPARTMENT_OTHER): Payer: Medicare HMO

## 2021-12-26 ENCOUNTER — Other Ambulatory Visit: Payer: Self-pay

## 2021-12-26 ENCOUNTER — Encounter (HOSPITAL_BASED_OUTPATIENT_CLINIC_OR_DEPARTMENT_OTHER): Payer: Self-pay | Admitting: Emergency Medicine

## 2021-12-26 DIAGNOSIS — Z79899 Other long term (current) drug therapy: Secondary | ICD-10-CM | POA: Insufficient documentation

## 2021-12-26 DIAGNOSIS — M79605 Pain in left leg: Secondary | ICD-10-CM | POA: Insufficient documentation

## 2021-12-26 DIAGNOSIS — R600 Localized edema: Secondary | ICD-10-CM | POA: Diagnosis not present

## 2021-12-26 DIAGNOSIS — M79662 Pain in left lower leg: Secondary | ICD-10-CM | POA: Diagnosis not present

## 2021-12-26 MED ORDER — TRAMADOL HCL 50 MG PO TABS: 50.0000 mg | ORAL_TABLET | Freq: Four times a day (QID) | ORAL | 0 refills | Status: DC | PRN

## 2021-12-26 MED ORDER — ACETAMINOPHEN 500 MG PO TABS
1000.0000 mg | ORAL_TABLET | Freq: Once | ORAL | Status: AC
  Administered 2021-12-26: 1000 mg via ORAL
  Filled 2021-12-26: qty 2

## 2021-12-26 MED ORDER — LIDOCAINE 5 % EX PTCH
1.0000 | MEDICATED_PATCH | CUTANEOUS | Status: DC
  Administered 2021-12-26: 1 via TRANSDERMAL
  Filled 2021-12-26: qty 1

## 2021-12-26 NOTE — ED Provider Notes (Signed)
?[facility] EMERGENCY DEPARTMENT ?Provider Note ? ? ?CSN: [id number] ?Arrival date & time: 12/26/21  1502 ? ?  ? ?History ? ?Chief Complaint  ?Patient presents with  ? Leg Pain  ? ? ?Tracey Buchanan is a 66 y.o. female. ? ?HPI ?Patient reports that she is getting left pain behind her knee slightly of the thigh and down the calf.  This been going on for about 3 days.  Patient reports she has not had any injury that she is aware of.  She does help her mother a lot and may be overexerting with helping lifting and moving.  She reports she is able to bear weight on it but is somewhat painful.  She can bend and extend at the knee.  No back pain.  She reports the pain is less in the joint of the knee than behind it and somewhat into the calf and thigh.  No numbness or tingling.  No pain down to the lower leg or foot.  No back pain, no pain in the buttock or upper hamstring.  Patient was seen in urgent care and sent to the emergency department for DVT study.  Reports she has been taking some ibuprofen for pain which is somewhat helpful.  She has been trying to elevate and ice at night. ?  ? ?Home Medications ?Prior to Admission medications   ?Medication Sig Start Date End Date Taking? Authorizing Provider  ?traMADol (ULTRAM) 50 MG tablet Take 1 tablet (50 mg total) by mouth every 6 (six) hours as needed. 1-2 tablets every 6 hours as needed for pain 12/26/21  Yes Quiana Cobaugh, Jeannie Done, MD  ?FLUoxetine (PROZAC) 10 MG capsule Take 3 capsules (30 mg total) by mouth daily. 03/05/21   Nunzio Cobbs, MD  ?FLUoxetine (PROZAC) 20 MG capsule Take 1 capsule (20 mg total) by mouth daily. 08/10/18   Fontaine, Belinda Block, MD  ?ibuprofen (ADVIL,MOTRIN) 800 MG tablet Take 1 tablet (800 mg total) by mouth every 8 (eight) hours as needed. ?Patient taking differently: Take 400 mg by mouth every 8 (eight) hours as needed for mild pain.  05/02/16   Fontaine, Belinda Block, MD  ?LORazepam (ATIVAN) 0.5 MG tablet Take 1 tablet (0.5 mg total)  by mouth every 8 (eight) hours. For anxiety ?Patient taking differently: Take 0.5 mg by mouth every 4 (four) hours as needed for anxiety. For anxiety 04/17/13   Fontaine, Belinda Block, MD  ?olmesartan-hydrochlorothiazide (BENICAR HCT) 40-25 MG per tablet Take 1 tablet by mouth daily.      [provider]  ?   ? ?Allergies    ?Cymbalta [duloxetine hcl], Effexor [venlafaxine], Flexeril [cyclobenzaprine], Prevacid [lansoprazole], Prilosec [omeprazole], Wellbutrin xl [bupropion], and Codeine   ? ?Review of Systems   ?Review of Systems ?Consitional: No fever no chills malaise ?Respiratory: No chest pain no shortness of breath no cough. ?Physical Exam ?Updated Vital Signs ?BP (!) 171/76 (BP Location: Right Arm)   Pulse (!) 55   Temp 97.7 ?F (36.5 ?C) (Oral)   Resp 19   SpO2 100%  ?Physical Exam ?Constitutional:   ?   Comments: Alert nontoxic well in appearance.  No acute distress.  Mental status clear  ?Pulmonary:  ?   Effort: Pulmonary effort is normal.  ?Musculoskeletal:     ?   General: Normal range of motion.  ?   Comments: Objectively patient does not appear to have a joint effusion.  Questionable slight swelling of the left knee relative to the right.  Patient endorses tenderness in the popliteal fossa more to the insertion point of the hamstring and the gastroc.  The calf is soft and pliable.  There are no soft tissue changes of rash or erythema.  No edema around the ankles.  Objectively the knees are similar in appearance.  When the patient ambulates she does have a mildly antalgic gait.  ?Skin: ?   General: Skin is warm and dry.  ?Neurological:  ?   General: No focal deficit present.  ?   Mental Status: She is oriented to person, place, and time.  ?   Motor: No weakness.  ?   Coordination: Coordination normal.  ?Psychiatric:     ?   Mood and Affect: Mood normal.  ? ? ?ED Results / Procedures / Treatments   ?Labs ?(all labs ordered are listed, but only abnormal results are displayed) ?Labs Reviewed - No  data to display ? ?EKG ?None ? ?Radiology ?US Venous Img Lower Unilateral Left ? ?Result Date: 12/26/2021 ?CLINICAL DATA:  Left calf pain, rule out DVT EXAM: LEFT LOWER EXTREMITY VENOUS DOPPLER ULTRASOUND TECHNIQUE: Gray-scale sonography with compression, as well as color and duplex ultrasound, were performed to evaluate the deep venous system(s) from the level of the common femoral vein through the popliteal and proximal calf veins. COMPARISON:  None. FINDINGS: VENOUS Normal compressibility of the common femoral, superficial femoral, and popliteal veins, as well as the visualized calf veins. Visualized portions of profunda femoral vein and great saphenous vein unremarkable. No filling defects to suggest DVT on grayscale or color Doppler imaging. Doppler waveforms show normal direction of venous flow, normal respiratory plasticity and response to augmentation. Limited views of the contralateral common femoral vein are unremarkable. OTHER None. Limitations: none IMPRESSION: Negative examination for deep venous thrombosis in the left lower extremity. Electronically Signed   By: Delanna Ahmadi M.D.   On: 12/26/2021 16:17   ? ?Procedures ?Procedures  ? ? ?Medications Ordered in ED ?Medications  ?lidocaine (LIDODERM) 5 % 1 patch (has no administration in time range)  ?acetaminophen (TYLENOL) tablet 1,000 mg (1,000 mg Oral Given 12/26/21 1827)  ? ? ?ED Course/ Medical Decision Making/ A&P ?  ?                        ?Medical Decision Making ?Patient presents with left leg pain.  She was seen at urgent care and recommended to get DVT study. ? ?DVT study is negative. ? ?Physical exam is for some reproducible discomfort predominantly in the posterior aspect of the leg around the lower hamstring and upper gastrocnemius muscles.  There is no significant joint effusion visible.  Patient perceives the pain to be less in the knee than behind the knee. ? ?Patient is ambulating with mildly antalgic gait.  At this time plan will be to  elevate and ice is much as possible.  We are providing a short knee immobilizer with hinge.  I recommend the patient follow-up with orthopedics for recheck.  She has previously had arthritis pain and limitations in the right knee.  Currently there is no sign of septic knee\DVT\cellulitis\zoster.  She denies she has pain in the buttock back or upper leg making sciatica less likely. ? ?Follow-up plan reviewed to see orthopedics.  Mentations for extra strength Tylenol and tramadol for pain control.  Counseling is given for elevating icing.  Commendation is to wear a knee immobilizer while standing and ambulating.  Careful return precautions reviewed. ? ? ? ? ? ? ? ? ?  Final Clinical Impression(s) / ED Diagnoses ?Final diagnoses:  ?Left leg pain  ? ? ?Rx / DC Orders ?ED Discharge Orders   ? ?      Ordered  ?  traMADol (ULTRAM) 50 MG tablet  Every 6 hours PRN       ? 12/26/21 1834  ? ?  ?  ? ?  ? ? ?  ?Charlesetta Shanks, MD ?12/26/21 1847 ? ?

## 2021-12-26 NOTE — Discharge Instructions (Signed)
1.  Try to elevate and ice your knee and lower leg is much as possible. ?2.  You may try wearing a knee brace when you are up and walking.  A medical supply store should be able to help you find one that is comfortable but supportive. ?3.  Take extra strength Tylenol every 6 hours for pain.  You may take a tramadol tablet as prescribed with the Tylenol for extra pain control. ?4.  See an orthopedic doctor for recheck within the next 3 to 7 days. ?5.  Return to the emergency department if you are getting redness of the joint or the soft tissues, increasing pain, fever or other concerning symptoms ?

## 2021-12-26 NOTE — ED Triage Notes (Signed)
Pt coming from UC for left lower leg pain and swelling to r/o blood clot. Hx of same. Current smoker.  ?

## 2022-01-06 ENCOUNTER — Ambulatory Visit
Admission: RE | Admit: 2022-01-06 | Discharge: 2022-01-06 | Disposition: A | Discharge status: Home / Self Care | Payer: Medicare HMO | Source: Ambulatory Visit | Attending: Acute Care | Admitting: Acute Care

## 2022-01-06 DIAGNOSIS — J439 Emphysema, unspecified: Secondary | ICD-10-CM | POA: Diagnosis not present

## 2022-01-06 DIAGNOSIS — Z87891 Personal history of nicotine dependence: Secondary | ICD-10-CM

## 2022-01-06 DIAGNOSIS — F17210 Nicotine dependence, cigarettes, uncomplicated: Secondary | ICD-10-CM

## 2022-01-10 DIAGNOSIS — M5136 Other intervertebral disc degeneration, lumbar region: Secondary | ICD-10-CM | POA: Diagnosis not present

## 2022-01-13 ENCOUNTER — Other Ambulatory Visit: Payer: Self-pay | Admitting: Occupational Therapy

## 2022-01-13 NOTE — Patient Outreach (Signed)
Aging Gracefully Program  OT Initial Visit  01/13/2022  Tracey Buchanan 09-19-1955 [id number]  Visit:  1- Initial Visit  Start Time:  1000 End Time:  1048 Total Minutes:  48  CCAP: Typical Daily Routine: Typical Daily Routine:: At home, goes out a couple times a month for groceries or errands. 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, Single [employer] Other Equipment:: None   Functional Mobility-Walk A Block: Walk A Block: A Little Difficulty Do You:: Use A Device Importance Of Learning New Strategies:: Not At All  Functional Mobility-Maintain Balance While Showering: Maintaining Balance While Showering: A Lot Of Difficulty Do You:: No Device/No Assistance Importance Of Learning New Strategies:: Very Much Other Comments:: has a tub shower Observation: Maintain Balance While Showering: N/O Safety: Moderate/Extreme Risk Efficiency: Somewhat Intervention: Yes  Functional Mobility-Stooping, Crouching, Kneeling To Retreive Item: Stooping, Crouching, or Kneeling To Retrieve Item: A Lot Of Difficulty Do You:: No Device/No Assistance Importance Of Learning New Strategies:: Moderate Other Comments:: needs a reacher Observation: Stoop, Crouch, or Kneel: Independent With Pain, Difficulty, Or Use Of Device Safety: A Little Risk Efficiency: Somewhat Intervention: Yes  Functional Mobility-Bending From  Standing Position To Pick Up Clothing Off The Floor: Bending Over From Standing Position To Pick Up Clothing Off The Floor: A Lot Of Difficulty Do You:: No Device/No Assistance Importance Of Learning New Strategies:: Moderate Other Comments:: needs a reacher Observation: Bending Over From Standing Postion To Pick Up Clothing Off Of Floor: Independent With Pain, Difficulty, Or Use Of Device Safety: A Little Risk Efficiency: Somewhat Intervention: Yes  Functional Mobility-Reaching For Items Above Shoulder Level: Reaching For Items Above Shoulder Level: A Little Difficulty Do You:: No Device/No Assistance Importance Of Learning New Strategies:: Not At All  Functional Mobility-Climb 1 Flight Of Stairs: Climb 1 Flight Of Stairs: A Little Difficulty Do You:: No Device/No Assistance Importance Of Learning New Strategies:: Not At All  Functional Mobility-Move In And Out Of Chair: Move In and Out Of A Chair: A Little Difficulty Do You:: No Device/No Assistance Importance Of Learning New Strategies:: Not At All  Functional Mobility-Move In And Out Of Bed: Move In and Out Of Bed: A Lot Of Difficulty Do You:: No Device/No Assistance Importance Of Learning New Strategies:: Very Much Other Comments:: needs a bed rail Observation: Move In and Out Of Bed: N/O Safety: A Little Risk Efficiency: Somewhat Intervention: Yes  Functional Mobility-Move In And Out Of Bath/Shower: Move In And Out Of A Bath/Shower: A Lot Of Difficulty Do You:: No Device/No Assistance Importance Of Learning New Strategies:: Very Much Other Comments:: needs walk in shower & grab bars Observation: Move In And Out Of Bath/Shower: N/O Safety: Moderate/Extreme Risk Efficiency: Somewhat Intervention: Yes  Functional Mobility-Get On And Off Toilet: Getting Up From The Floor: A Lot Of Difficulty Do You:: No Device/No Assistance Importance Of Learning New Strategies:: A Little  Functional  Mobility-Into And Out Of Car,  Not Including Driving: Into  And Out Of Car, Not Including Driving: A Little Difficulty Do You:: No Device/No Assistance Importance Of Learning New Strategies:: Not At All  Activities of Daily Living-Bathing/Showering: ADL-Bathing/Showering: A Lot of Difficulty Do You:: No Device/No Assistance Importance Of Learning New Strategies: Very Much Other Comments:: needs shower seat ADL Observation: Bathing/Showering: N/O Safety: Moderate/Extreme Risk Efficiency: Somewhat Intervention: Yes  Activities of Daily Living-Personal Hygiene and Grooming: Personal Hygiene and Grooming: No Difficulty Do You:: No Device/No Assistance Importance Of Learning New Strategies: Not At All  Activities of Daily Living-Toilet Hygiene: Toilet Hygiene: A Little Difficulty Do You:: No Device/No Assistance Importance Of Learning New Strategies: Not At All  Activities of Daily Living-Put On And Take Off Undergarments (Incl. Fasteners): Put On And Take Off Undergarments (Incl. Fasteners): A Little Difficulty Do You:: No Device/No Assistance Importance Of Learning New Strategies: Not At All  Activities of Daily Living-Put On And Take Off Shirt/Dress/Coat (Incl. Fasteners): Put On And Take Off Shirt/Dress/Coat (Incl. Fasteners): A Little Difficulty Do You:: No Device/No Assistance Importance Of Learning New Strategies: Not At All  Activities of Daily Living-Put On And Take Off Socks And Shoes: Put On And Take Off Socks And  Shoes: A Little Difficulty Do You:: No Device/No Assistance Importance Of Learning New Strategies: Not At All  Activities of Daily Living-Feed Self: Feed Self: No Difficulty Do You:: No Device/No Assistance Importance Of Learning New Strategies: Not At All  Activities of Daily Living-Rest And Sleep: Rest and Sleep: No Difficulty Do You:: No Device/No Assistance Importance Of Learning New Strategies: Not At All  Activities of Daily Living-Sexual Activity: Sexual  Activity:  N/A  Instrumental Activities of Daily Living-Light Homemaking (Laundry, Straightening Up, Vacuuming):  Do Light Homemaking (Laundry, Straightening Up, Vacuuming): A Little Difficulty Do You:: No Device/No Assistance Importance Of Learning New Strategies: Not At All  Instrumental Activities of Daily Living-Making A Bed: Making a Bed: A Little Difficulty Do You:: No Device/No Assistance Importance Of Learning New Strategies: Not At All  Instrumental Activities of Daily Living-Washing Dishes By Hand While Standing At The Sink: Washing Dishes By Hand While Standing At The Sink: No Difficulty Do You:: No Device/No Assistance Importance Of Learning New Strategies: Not At All  Instrumental Activities of Daily Living-Grocery Shopping: Do Grocery Shopping: A Little Difficulty Do You:: No Device/No Assistance Importance Of Learning New Strategies: Not At All  Instrumental Activities of Daily Living-Use Telephone: Use Telephone: No Difficulty Do You:: No Device/No Assistance Importance Of Learning New Strategies: Not At All  Instrumental Activities of Daily Living-Financial Management: Financial Management: No Difficulty Do You:: No Device/No Assistance Importance Of Learning New Strategies: Not At All  Instrumental Activities of Daily Living-Medications: Take Medications: No Difficulty Do You:: No Device/No Assistance Importance Of Learning New Strategies: Not At All  Instrumental Activities of Daily Living-Health Management And Maintenance: Health Management & Maintenance: No Difficulty Do You:: No Device/No Assistance Importance Of Learning New Strategies: Not At All  Instrumental Activities of Daily Living-Meal Preparation and Clean-Up: Meal Preparation and Clean-Up: No Difficulty Do You:: No Device/No Assistance Importance Of Learning New Strategies: Not At All  Instrumental Activities of Daily Living-Provide Care For Others/Pets: Care For Others/Pets: No Difficulty Do  You:: No Device/No Assistance Importance Of Learning New Strategies: Not At All  Instrumental Activities of Daily Living-Take Part In Organized Social Activities: Take Part In Organized Social Activities: No Difficulty Do You:: No Device/No Assistance Importance Of Learning New  Strategies: Not At All  Instrumental Activities of Daily Living-Leisure Participation: Leisure Participation: N/A  Instrumental Activities of Daily Living-Employment/Volunteer Activities: Employment/Volunteer Activities: N/A    Readiness To Change Score:  Readiness to Change Score: 7.33  Home Environment Assessment: Outside Home Entry:: Several steps with railings Dining Room:: small, connected to kitchen Living Room:: clear walkways Kitchen:: small, connected to dining room Stairs:: No stairs inside, 3 sets of steps outside Bathroom:: small, has tub shower with high step over Master Bedroom:: high bed, clear walkways Other Home Environment Concerns:: would like steps checked for weakness  Patient Education: Education Provided: Yes Education Details: Educated on [employer] program and components Person(s) Educated: Patient Comprehension: Verbalized Understanding  Goals:  Goals Addressed             This Visit's Progress    Patient Stated       Pt will improve safety in getting into and out of bed using DME as needed.      Patient Stated       Patient will improve ability to get up and down from commode with minimal pain and difficulty.     Patient Stated       Patient will improve safety and independence in getting into and out of shower and completing bathing tasks using DME/AE as needed.      Patient Stated       Patient will improve ability to grasp and pick up items from the floor using AE as needed.         Post Clinical Reasoning: Clinician View Of Client Situation:: Client is concerned about her knees and mobility, does use strategies such as DME when needed to adapt to pain. Client has  given up some things like taking a shower due to knee pain and mobility concerns. Assists in caring for her Mom and alternates with other family members. Limits going out into the community due to mobility issues.  Client View Of His/Her Situation:: Client endorses worsening mobility due to arthritis and pain. Is interested in program and determining what she can do to be safer in her home as it is her peaceful place and she does not want to move anytime soon.  Next Visit Plan:: Begin goals-target bed mobility    Ezra Sites, OTR/L  [phone] 01/13/2022

## 2022-01-20 ENCOUNTER — Other Ambulatory Visit: Payer: Self-pay

## 2022-01-20 DIAGNOSIS — Z122 Encounter for screening for malignant neoplasm of respiratory organs: Secondary | ICD-10-CM

## 2022-01-20 DIAGNOSIS — Z87891 Personal history of nicotine dependence: Secondary | ICD-10-CM

## 2022-01-20 DIAGNOSIS — F17210 Nicotine dependence, cigarettes, uncomplicated: Secondary | ICD-10-CM

## 2022-01-21 DIAGNOSIS — M170 Bilateral primary osteoarthritis of knee: Secondary | ICD-10-CM | POA: Diagnosis not present

## 2022-01-24 ENCOUNTER — Telehealth: Payer: Self-pay | Admitting: Acute Care

## 2022-01-24 NOTE — Telephone Encounter (Signed)
Called patient and she would like the results of her CT scan that she had done at the beginning of the month.  Please advise Judson Roch

## 2022-01-27 NOTE — Telephone Encounter (Signed)
Spoke with patient by using two identifiers.  Results of LDCT for nodule follow up shows area of concern/nodules as stable and not suspicious for lung cancer. Will place order for annual CT chest and schedule with patient closer to date. Patient acknowledged understanding.  She received letter thru Deloris Ping but wanted to review by phone also.  Results faxed to PCP.

## 2022-02-14 ENCOUNTER — Other Ambulatory Visit: Payer: Self-pay

## 2022-02-14 NOTE — Patient Instructions (Signed)
Goals Addressed             This Visit's Progress    Client will report improvement in pain level as evidence by pain level decrease by at least 1-2 points on the pain scale       Aging Gracefully RN Goal  Goals: Take medications as recommended Attend provider visits as scheduled Ice may help with swelling Heat may help with stiffness Attend outpatient therapy and perform exercises as recommended     Patient Stated: Client will work on smoking cessation as evidence by review of educational material and establishing a plan over the next 120 days.       Aging Gracefully: RN Goal  02/14/22: Assessment: Client reports she continues to smoke and would like to work on smoking cessation. She states she has patches  Goals: Reviewed educational material provided. Decided on the method that you feel would work best for you and write down your goals Pace yourself - you can try to put the number of cigarettes you smoke in a week in a baggy and reduce by 1 cigarette each week. Discuss smoking cessation with your provider for available alternatives Identify and work on your stressors Contact [place] or [phone].        Chronic Knee Pain, Adult Knee pain that lasts longer than 3 months is called chronic knee pain. You may have pain in one or both knees. Symptoms of chronic knee pain may also include swelling and stiffness. The most common cause is age-related wear and tear (osteoarthritis) of your knee joint. Many conditions can cause chronic knee pain. Treatment depends on the cause. The main treatments are physical therapy and weight loss. It may also be treated with medicines, injections, a knee sleeve or brace, and by using crutches. Rest, ice, pressure (compression), and elevation, also known as RICE therapy, may also be recommended. Follow these instructions at home: If you have a knee sleeve or brace:  Wear the knee sleeve or brace as told by your doctor. Take it off only  as told by your doctor. Loosen it if your toes: Tingle. Become numb. Turn cold and blue. Keep it clean. If the sleeve or brace is not waterproof: Do not let it get wet. Ask your doctor if you may take it off when you take a bath or shower. If not, cover it with a watertight covering. Managing pain, stiffness, and swelling     If told, put heat on your knee. Do this as often as told by your doctor. Use the heat source that your doctor recommends, such as a moist heat pack or a heating pad. If you have a removable knee sleeve or brace, take it off as told by your doctor. Place a towel between your skin and the heat source. Leave the heat on for 20-30 minutes. Take off the heat if your skin turns bright red. This is very important. If you cannot feel pain, heat, or cold, you have a greater risk of getting burned. If told, put ice on your knee. To do this: If you have a removable knee sleeve or brace, take it off as told by your doctor. Put ice in a plastic bag. Place a towel between your skin and the bag. Leave the ice on for 20 minutes, 2-3 times a day. Take off the ice if your skin turns bright red. This is very important. If you cannot feel pain, heat, or cold, you have a greater risk of damage to  the area. Move your toes often. Raise the injured area above the level of your heart while you are sitting or lying down. Activity Avoid activities where both feet leave the ground at the same time (high-impact activities). Examples are running, jumping rope, and doing jumping jacks. Follow the exercise plan that your doctor makes for you. Your doctor may suggest that you: Avoid activities that make knee pain worse. You may need to change the exercises that you do, the sports that you participate in, or your job duties. Wear shoes with cushioned soles. Avoid sports that require running and sudden changes in direction. Do exercises or physical therapy. This is planned to match your needs and  your abilities. Do exercises that increase your balance and strength, such as tai chi and yoga. Do not use your injured knee to support your body weight until your doctor says that you can. Use crutches as told by your doctor. Return to your normal activities when your doctor says that it is safe. General instructions Take over-the-counter and prescription medicines only as told by your doctor. If you are overweight, work with your doctor and a food expert (dietitian) to set goals to lose weight. Being overweight can make your knee hurt more. Do not smoke or use any products that contain nicotine or tobacco. If you need help quitting, ask your doctor. Keep all follow-up visits. Contact a doctor if: You have knee pain that is not getting better or gets worse. You are not able to do your exercises due to knee pain. Get help right away if: Your knee swells and the swelling gets worse. You cannot move your knee. You have very bad knee pain. Summary Knee pain that lasts more than 3 months is called chronic knee pain. The main treatments for chronic knee pain are physical therapy and weight loss. You may also need to take medicines, wear a knee sleeve or brace, use crutches, and put ice or heat on your knee. Lose weight if you are overweight. Work with your doctor and a food expert (dietitian) to help you set goals to lose weight. Being overweight can make your knee hurt more. Follow the exercise plan that your doctor makes for you. This information is not intended to replace advice given to you by your health care provider. Make sure you discuss any questions you have with your health care provider. Document Revised: 02/05/2020 Document Reviewed: 02/05/2020 Elsevier Patient Education  [place] of Quitting Smoking Quitting smoking is a physical and mental challenge. You may have cravings, withdrawal symptoms, and temptation to smoke. Before quitting, work with  your health care provider to make a plan that can help you manage quitting. Making a plan before you quit may keep you from smoking when you have the urge to smoke while trying to quit. How to manage lifestyle changes Managing stress Stress can make you want to smoke, and wanting to smoke may cause stress. It is important to find ways to manage your stress. You could try some of the following: Practice relaxation techniques. Breathe slowly and deeply, in through your nose and out through your mouth. Listen to music. Soak in a bath or take a shower. Imagine a peaceful place or vacation. Get some support. Talk with family or friends about your stress. Join a support group. Talk with a counselor or therapist. Get some physical activity. Go for a walk, run, or bike ride. Play a favorite sport. Practice yoga.  Medicines  Talk with your health care provider about medicines that might help you deal with cravings and make quitting easier for you. Relationships Social situations can be difficult when you are quitting smoking. To manage this, you can: Avoid parties and other social situations where people might be smoking. Avoid alcohol. Leave right away if you have the urge to smoke. Explain to your family and friends that you are quitting smoking. Ask for support and let them know you might be a bit grumpy. Plan activities where smoking is not an option. General instructions Be aware that many people gain weight after they quit smoking. However, not everyone does. To keep from gaining weight, have a plan in place before you quit, and stick to the plan after you quit. Your plan should include: Eating healthy snacks. When you have a craving, it may help to: Eat popcorn, or try carrots, celery, or other cut vegetables. Chew sugar-free gum. Changing how you eat. Eat small portion sizes at meals. Eat 4-6 small meals throughout the day instead of 1-2 large meals a day. Be mindful when you eat.  You should avoid watching television or doing other things that might distract you as you eat. Exercising regularly. Make time to exercise each day. If you do not have time for a long workout, do short bouts of exercise for 5-10 minutes several times a day. Do some form of strengthening exercise, such as weight lifting. Do some exercise that gets your heart beating and causes you to breathe deeply, such as walking fast, running, swimming, or biking. This is very important. Drinking plenty of water or other low-calorie or no-calorie drinks. Drink enough fluid to keep your urine pale yellow.  How to recognize withdrawal symptoms Your body and mind may experience discomfort as you try to get used to not having nicotine in your system. These effects are called withdrawal symptoms. They may include: Feeling hungrier than normal. Having trouble concentrating. Feeling irritable or restless. Having trouble sleeping. Feeling depressed. Craving a cigarette. These symptoms may surprise you, but they are normal to have when quitting smoking. To manage withdrawal symptoms: Avoid places, people, and activities that trigger your cravings. Remember why you want to quit. Get plenty of sleep. Avoid coffee and other drinks that contain caffeine. These may worsen some of your symptoms. How to manage cravings Come up with a plan for how to deal with your cravings. The plan should include the following: A definition of the specific situation you want to deal with. An activity or action you will take to replace smoking. A clear idea for how this action will help. The name of someone who could help you with this. Cravings usually last for 5-10 minutes. Consider taking the following actions to help you with your plan to deal with cravings: Keep your mouth busy. Chew sugar-free gum. Suck on hard candies or a straw. Brush your teeth. Keep your hands and body busy. Change to a different activity right  away. Squeeze or play with a ball. Do an activity or a hobby, such as making bead jewelry, practicing needlepoint, or working with wood. Mix up your normal routine. Take a short exercise break. Go for a quick walk, or run up and down stairs. Focus on doing something kind or helpful for someone else. Call a friend or family member to talk during a craving. Join a support group. Contact a quitline. Where to find support To get help or find a support group: Call the National Cancer Institute's Smoking  Quitline: 1-800-QUIT-NOW 620 184 9789) Text QUIT to SmokefreeTXT: 779390 Where to find more information Visit these websites to find more information on quitting smoking: U.S. Department of Health and Human Services: www.smokefree.gov American Lung Association: www.freedomfromsmoking.org Centers for Disease Control and Prevention (CDC): http://www.wolf.info/ American Heart Association: www.heart.org Contact a health care provider if: You want to change your plan for quitting. The medicines you are taking are not helping. Your eating feels out of control or you cannot sleep. You feel depressed or become very anxious. Summary Quitting smoking is a physical and mental challenge. You will face cravings, withdrawal symptoms, and temptation to smoke again. Preparation can help you as you go through these challenges. Try different techniques to manage stress, handle social situations, and prevent weight gain. You can deal with cravings by keeping your mouth busy (such as by chewing gum), keeping your hands and body busy, calling family or friends, or contacting a quitline for people who want to quit smoking. You can deal with withdrawal symptoms by avoiding places where people smoke, getting plenty of rest, and avoiding drinks that contain caffeine. This information is not intended to replace advice given to you by your health care provider. Make sure you discuss any questions you have with your health care  provider. Document Revised: 08/13/2021 Document Reviewed: 08/13/2021 Elsevier Patient Education  [place].

## 2022-02-14 NOTE — Patient Outreach (Signed)
Aging Gracefully Program  RN Visit  02/14/2022  Tracey Buchanan May 30, 1956 [id number]  Visit:     Start Time:   09:30 am End Time:   10:50 am  Total Minutes:   80 min   Readiness To Change Score:  Readiness to Change Score: 9.67  Universal RN Interventions: Calendar Distribution: Yes (Provided Calendar and discussed possible applications) Exercise Review: Yes (Provided exercise booklet and reviewed/demonstrated each exercise) Medications: Yes (Medication reviewed) Medication Changes: No Mood: Yes (discussed mood. client has lost a number of family member and her closest friend over the past couple years. She reports she is receiving counseling and is taking medication. She reports she is having a good day today.) Pain: Yes (reports arthritis to both knees. No pain in right knee, but left knee has some swelling and is hurting today. She reports received steroid injection last month with improvement. She also states she will be starting outpatient physical therapy next week.) PCP Advocacy/Support: No Fall Prevention: No Incontinence: Yes (Reports urgency and wears an incontinence pad when she goes out for protection.States this does not keep her from doing anything she wants to do or going anywhere she wants to go. Discussed Keegal exercises-verbalized understanding.) Clinician View Of Client Situation: Sitting on front porch upon RN's arrival and request to complete visit on the porch. Her Mother was in the home asleep-porch/yard neat and clean. She has been grieving several losses over the past couple years, but is being treated with counseling/medication. Denies any concerns or issues with grief at this time. Very proud of her home/yard and states she is working to do everything she can to continue to be active into the future. Client View Of His/Her Situation: She discussed family/friend losses since Covid and discussed has been an adjustment. She reports she is seeing a Social worker and  taking medication. She is also sharing in the care of her mother with her sister and her mother comes to her home about 2 weeks each month. She reports supportive friends/family and attends church. She has a positive attititude and is looking forward to the completion of home modifications.  Healthcare Provider Communication: Did Higher education careers adviser With [employer] Provider?: No According to Client, Did PCP Report Communication With An Aging Gracefully RN?: No  Clinician View of Client Situation: Clinician View Of Client Situation: Sitting on front porch upon RN's arrival and request to complete visit on the porch. Her Mother was in the home asleep-porch/yard neat and clean. She has been grieving several losses over the past couple years, but is being treated with counseling/medication. Denies any concerns or issues with grief at this time. Very proud of her home/yard and states she is working to do everything she can to continue to be active into the future. Client's View of His/Her Situation: Client View Of His/Her Situation: She discussed family/friend losses since Covid and discussed has been an adjustment. She reports she is seeing a Social worker and taking medication. She is also sharing in the care of her mother with her sister and her mother comes to her home about 2 weeks each month. She reports supportive friends/family and attends church. She has a positive attititude and is looking forward to the completion of home modifications.  Medication Assessment: Do You Have Any Problems Paying For Medications?: No Where Does Client Store Medications?: Cabinet Can Client Read Pill Bottles?: Yes Does Client Use A Pillbox?: No Does Anyone Assist Client In Taking Medications?: No Do You Take Vitamin D?: No (reports has taken  in the past) Total Number Of Medications That The Client Takes: 3 Does Client Have Any Questions Or Concerns About Medictions?: No Is Client Complaining Of Any Symptoms That  Could Be Side Effects To Medications?: No Any Possible Changes In Medication Regimen?: No  OT Update: n/a  Session Summary: Assessment completed. Client reports family/friend losses and retirement since the Pandemic. And reports this has been an adjustment. She reports her mental health issues are being treated and does not want to work on this. She also does not want to work on incontinence.Pelvic floor muscle strengthening discussed. She is also sharing in the care of her mother with her sister - her mother comes to her home about 2 weeks each month. She reports supportive friends/family and attends church. She has a positive attititude and is looking forward to the completion of home modifications. Client reports she would like to work on pain and smoking cessation.   Thea Silversmith, RN, MSN, BSN, [place] Care Management Coordinator [phone]

## 2022-02-18 DIAGNOSIS — M170 Bilateral primary osteoarthritis of knee: Secondary | ICD-10-CM | POA: Diagnosis not present

## 2022-03-01 DIAGNOSIS — M1711 Unilateral primary osteoarthritis, right knee: Secondary | ICD-10-CM | POA: Diagnosis not present

## 2022-03-01 DIAGNOSIS — M1712 Unilateral primary osteoarthritis, left knee: Secondary | ICD-10-CM | POA: Diagnosis not present

## 2022-03-01 DIAGNOSIS — M170 Bilateral primary osteoarthritis of knee: Secondary | ICD-10-CM | POA: Diagnosis not present

## 2022-03-21 ENCOUNTER — Other Ambulatory Visit: Payer: Self-pay

## 2022-03-21 NOTE — Patient Instructions (Signed)
Visit Information  Thank you for taking time to visit with me today. Please don't hesitate to contact me if I can be of assistance to you before our next scheduled telephone appointment.  Following are the goals we discussed today:   Goals Addressed             This Visit's Progress    Client will report improvement in pain level as evidence by pain level decrease by at least 1-2 points on the pain scale       Aging Gracefully RN Goal  Goals: Take medications as recommended Attend provider visits as scheduled Ice may help with swelling Heat may help with stiffness Attend outpatient therapy and perform exercises as recommended  03/21/2022 Assessment: Pain level of 5/10. Reports decreased pain since cortisone injection.  Reports she is doing her home exercises 4-5 times per week.  Interventions: assessed pain. Reviewed OTC medications. Reviewed heat and ice treatment. Provided another home exercise plan and encouraged patient to complete daily.  Plan: next folllow up home visit is planned for 04/25/2022  [name] RN, BSN, CEN RN Case Manager for [place] Mobile: [phone]      Patient Stated: Client will work on smoking cessation as evidence by review of educational material and establishing a plan over the next 120 days.       Aging Gracefully: RN Goal  02/14/22: Assessment: Client reports she continues to smoke and would like to work on smoking cessation. She states she has patches  Goals: Reviewed educational material provided. Decided on the method that you feel would work best for you and write down your goals Pace yourself - you can try to put the number of cigarettes you smoke in a week in a baggy and reduce by 1 cigarette each week. Discuss smoking cessation with your provider for available alternatives Identify and work on your stressors Contact 1800 -Quit-Now or [phone].  03/21/2022 Assessment: patient states she is  not interested in smoking cessation.  Interventions: Provided a listening ear. Plan: next home visit on 04/25/2022 at [place], BSN, CEN RN Case Manager for [place] Mobile: [phone]          Our next appointment is  in person  on 04/25/2022 0930  Please call the care guide team at [phone] if you need to cancel or reschedule your appointment.   If you are experiencing a Mental Health or [place] or need someone to talk to, please call the Suicide and Crisis Lifeline: 988 call the [place] National Suicide Prevention Lifeline: 671-440-5663 or TTY: 7087081921 TTY 239-254-5580) to talk to a trained counselor call 1-800-273-TALK (toll free, 24 hour hotline) call 911   The patient verbalized understanding of instructions, educational materials, and care plan provided today and agreed to receive a mailed copy of patient instructions, educational materials, and care plan.   [name] RN, BSN, Careers information officer for [employer] Mobile: [phone]

## 2022-03-21 NOTE — Patient Outreach (Signed)
Aging Gracefully Program  RN Visit  03/21/2022  Tracey Buchanan [birth year] [id number]  Visit:   RN home visit #2  Start Time:   0930 End Time:   1040 Total Minutes:   58  Readiness To Change Score:     Universal RN Interventions: Calendar Distribution: Yes Exercise Review: Yes Medication Changes: No Mood: Yes Pain: Yes PCP Advocacy/Support: No Fall Prevention: Yes Incontinence: No Clinician View Of Client Situation: Arrived and patient sitting on hte front porch with her dog- Barrock.  Invited into the home at the end of the visit. Home neat and clean.  Patient lives alone but is the caregiver for her mother every 2 weeks. Client View Of His/Her Situation: Patient reports that she likes her home and knows her neighbors.  Reports she is single and has no children. Reports her biggest health concern is her knee pain. Recent injections and now has decreased pain. Reports she is taking advil for pain. She uses heat and ice.  Reports left knee is worse than the right.   Reports she is not ready to stop smoking yet.  Patient discussed her losses and how hard it is. Reports she sees a Social worker and takes medication as prescribed.  Healthcare Provider Communication: Did Higher education careers adviser With [employer] Provider?: No According to Client, Did PCP Report Communication With An Aging Gracefully RN?: No  Clinician View of Client Situation: Clinician View Of Client Situation: Arrived and patient sitting on hte front porch with her dog- Barrock.  Invited into the home at the end of the visit. Home neat and clean.  Patient lives alone but is the caregiver for her mother every 2 weeks. Client's View of His/Her Situation: Client View Of His/Her Situation: Patient reports that she likes her home and knows her neighbors.  Reports she is single and has no children. Reports her biggest health concern is her knee pain. Recent injections and now has decreased pain. Reports she is taking advil for pain.  She uses heat and ice.  Reports left knee is worse than the right.   Reports she is not ready to stop smoking yet.  Patient discussed her losses and how hard it is. Reports she sees a Social worker and takes medication as prescribed.  Medication Assessment: denies changes to medications    OT Update: patient interested to know when OT is coming back out. I  will send an email to inquire.  Session Summary: Patient doing well.  Ready for home modifications.  Denies any new problems or concerns today.   Goals Addressed             This Visit's Progress    Client will report improvement in pain level as evidence by pain level decrease by at least 1-2 points on the pain scale       Aging Gracefully RN Goal  Goals: Take medications as recommended Attend provider visits as scheduled Ice may help with swelling Heat may help with stiffness Attend outpatient therapy and perform exercises as recommended  03/21/2022 Assessment: Pain level of 5/10. Reports decreased pain since cortisone injection.  Reports she is doing her home exercises 4-5 times per week.  Interventions: assessed pain. Reviewed OTC medications. Reviewed heat and ice treatment. Provided another home exercise plan and encouraged patient to complete daily.  Plan: next folllow up home visit is planned for 04/25/2022  [name] RN, BSN, CEN RN Case Manager for [facility] Mobile: [phone]      Patient  Stated: Client will work on smoking cessation as evidence by review of Neurosurgeon and establishing a plan over the next 120 days.       Aging Gracefully: RN Goal  02/14/22: Assessment: Client reports she continues to smoke and would like to work on smoking cessation. She states she has patches  Goals: Reviewed educational material provided. Decided on the method that you feel would work best for you and write down your goals Pace yourself - you can try to put the number of cigarettes  you smoke in a week in a baggy and reduce by 1 cigarette each week. Discuss smoking cessation with your provider for available alternatives Identify and work on your stressors Contact 1800 -Quit-Now or [phone].  03/21/2022 Assessment: patient states she is not interested in smoking cessation.  Interventions: Provided a listening ear. Plan: next home visit on 04/25/2022 at [place], BSN, CEN RN Case Freight forwarder for [place] Mobile: [phone]        [name] RN, BSN, Careers information officer for [employer] Mobile: [phone]

## 2022-04-25 ENCOUNTER — Other Ambulatory Visit: Payer: Self-pay | Admitting: Occupational Therapy

## 2022-04-25 ENCOUNTER — Other Ambulatory Visit: Payer: Self-pay

## 2022-04-25 NOTE — Patient Outreach (Signed)
Aging Gracefully Program  RN Visit  04/25/2022  Tracey Buchanan 1956/04/29 [id number]  Visit:   RN home visit #3  Start Time:   0930 End Time:   2426 Total Minutes:   63   Readiness To Change Score:     Universal RN Interventions: Calendar Distribution: Yes Exercise Review: Yes Medications: Yes Medication Changes: No Mood: Yes Pain: Yes PCP Advocacy/Support: No Fall Prevention: Yes Incontinence: No Clinician View Of Client Situation: Arried and patient sitting on the porch with dog. Client View Of His/Her Situation: Patient reports that she is doing well.  C/o mid back pain. Worse with getting into and out of car.   Uses heat with helps.    Nochanges in medications and no falls.  Healthcare Provider Communication:none    Clinician View of Client Situation: Public affairs consultant Of Client Situation: Arried and patient sitting on the porch with dog. Client's View of His/Her Situation: Client View Of His/Her Situation: Patient reports that she is doing well.  C/o mid back pain. Worse with getting into and out of car.   Uses heat with helps.    Nochanges in medications and no falls.  Medication Assessment:denies any changes  OT Update: pending home modifications  Session Summary: Patient doing well. Discussed the upset about the recent protest in the apartment complex in front of her home.  Reports she is worried about her dog.  He is coming to the end of life and she is worried about him.  Offered support.    Goals Addressed             This Visit's Progress    Client will report improvement in pain level as evidence by pain level decrease by at least 1-2 points on the pain scale       Aging Gracefully RN Goal  Goals: Take medications as recommended Attend provider visits as scheduled Ice may help with swelling Heat may help with stiffness Attend outpatient therapy and perform exercises as recommended  03/21/2022 Assessment: Pain level of 5/10. Reports decreased  pain since cortisone injection.  Reports she is doing her home exercises 4-5 times per week.  Interventions: assessed pain. Reviewed OTC medications. Reviewed heat and ice treatment. Provided another home exercise plan and encouraged patient to complete daily.  Plan: next folllow up home visit is planned for 04/25/2022  [name] RN, BSN, CEN RN Case Manager for [place] Mobile: [phone]   04/25/2022  Assessment:  Continues to have pain.  Reports she is more sore today.  Reports that she is doing her home exercises and is feeling stronger.  Interventions:  Encouraged patient to continue to do her home exercises.  Reviewed fall preventions.  Plan: Folllow up in 1 month.  [name] RN, BSN, CEN RN Case Freight forwarder for [place] Mobile: [phone]       Patient Stated: Client will work on smoking cessation as evidence by review of educational material and establishing a plan over the next 120 days.       Aging Gracefully: RN Goal  02/14/22: Assessment: Client reports she continues to smoke and would like to work on smoking cessation. She states she has patches  Goals: Reviewed educational material provided. Decided on the method that you feel would work best for you and write down your goals Pace yourself - you can try to put the number of cigarettes you smoke in a week in a baggy and reduce by 1 cigarette each  week. Discuss smoking cessation with your provider for available alternatives Identify and work on your stressors Contact 1800 -Quit-Now or [phone].  03/21/2022 Assessment: patient states she is not interested in smoking cessation.  Interventions: Provided a listening ear. Plan: next home visit on 04/25/2022 at [place], BSN, [place] RN Case Manager for [place] Mobile: [phone]   04/25/2022 Assessment:  smokes 10 per day.  Feels like she stress  smokes. Interventions: Reviewed smoking cessation and motivation. Plan: Follow up in 1 month.  [name] RN, BSN, Careers information officer for [employer] Mobile: [phone]         [name] RN, BSN, Careers information officer for [employer] Mobile: [phone]

## 2022-04-25 NOTE — Patient Outreach (Signed)
Aging Gracefully Program  OT Follow-Up Visit  04/25/2022  Tracey Buchanan [birth year] [id number]  Visit:  2- Second Visit  Start Time:  0900 End Time:  0926 Total Minutes:  26       Readiness to Change Score :  Readiness to Change Score: 7.33    Durable Medical Equipment: Adaptive Equipment: Bed Rail Adaptive Equipment Distribution Date: 04/25/22  Patient Education: Education Provided: Yes Education Details: Educated on bed mobility and safe bedrail use Person(s) Educated: Patient Comprehension: Verbalized Understanding, Returned Demonstration  Goals:   Goals Addressed             This Visit's Progress    COMPLETED: Patient Stated       Pt will improve safety in getting into and out of bed using DME as needed.   ACTION PLANNING - FUNCTIONAL MOBILITY Target Problem Area: Getting in and out of bed  Why Problem May Occur: -high bed -no bed rail -BLE pain     Target Goal: Pt will improve safety in getting into and out of bed using DME as needed.    STRATEGIES Saving Your Energy: DO: DON'T:  Take breaks    Raise the height of surfaces    Remove tripping hazards     Modifying your home environment and making it safe: DO: DON'T:  Install bedrail Hold onto unsafe surfaces   Remove or strongly secure throw rugs    Provide adequate lighting Use dim lights or lights that cast a lot of shadows  Simplifying the way you set up tasks or daily routines: DO: DON'T:  Move slowly Rush during transfers or walking    Practice It is important to practice the strategies so we can determine if they will be effective in helping to reach your goal. Follow these specific recommendations: Use a bedrail 2.   Take your time, don't rush to get in or out of the bed 3.   Position yourself safely before climbing in or out of the bed  If a strategy does not work the first time, try it again and again (and maybe again). We may make some changes over the next few sessions,  based on how they work.   [name], OTR/L  [phone]            Post Clinical Reasoning: Client Interventions Goal One Pt will improve safety in getting into and out of bed using DME as needed.  Did Client Try?: Yes Targeted Problem Area Status: A Lot Better  Clinician View Of Client Situation:: Client has not signed the paperwork for [facility] yet, although has decided what work she would like done for the budget she has. OT provided bedrail and installed for client. Good form and use demonstrated.  Client View Of His/Her Situation:: Client reporting pain in BLE today from walking so much on Saturday with her family when shopping. Likes the bedrail, feels this will help with mobility on bad days.  Next Visit Plan:: Provide reacher, follow up on [facility] starting their work   [employer], OTR/L  [phone]

## 2022-04-25 NOTE — Patient Instructions (Addendum)
Visit Information  Thank you for taking time to visit with me today. Please don't hesitate to contact me if I can be of assistance to you before our next scheduled telephone appointment.  Following are the goals we discussed today:   Goals Addressed             This Visit's Progress    Client will report improvement in pain level as evidence by pain level decrease by at least 1-2 points on the pain scale       Aging Gracefully RN Goal  Goals: Take medications as recommended Attend provider visits as scheduled Ice may help with swelling Heat may help with stiffness Attend outpatient therapy and perform exercises as recommended  03/21/2022 Assessment: Pain level of 5/10. Reports decreased pain since cortisone injection.  Reports she is doing her home exercises 4-5 times per week.  Interventions: assessed pain. Reviewed OTC medications. Reviewed heat and ice treatment. Provided another home exercise plan and encouraged patient to complete daily.  Plan: next folllow up home visit is planned for 04/25/2022  [name] RN, BSN, CEN RN Case Manager for [place] Mobile: [phone]   04/25/2022  Assessment:  Continues to have pain.  Reports she is more sore today.  Reports that she is doing her home exercises and is feeling stronger.  Interventions:  Encouraged patient to continue to do her home exercises.  Reviewed fall preventions.  Plan: Folllow up in 1 month.  [name] RN, BSN, CEN RN Case Freight forwarder for [place] Mobile: [phone]       Patient Stated: Client will work on smoking cessation as evidence by review of educational material and establishing a plan over the next 120 days.       Aging Gracefully: RN Goal  02/14/22: Assessment: Client reports she continues to smoke and would like to work on smoking cessation. She states she has patches  Goals: Reviewed educational material provided. Decided on  the method that you feel would work best for you and write down your goals Pace yourself - you can try to put the number of cigarettes you smoke in a week in a baggy and reduce by 1 cigarette each week. Discuss smoking cessation with your provider for available alternatives Identify and work on your stressors Contact 1800 -Quit-Now or [phone].  03/21/2022 Assessment: patient states she is not interested in smoking cessation.  Interventions: Provided a listening ear. Plan: next home visit on 04/25/2022 at [place], BSN, [place] RN Case Manager for [place] Mobile: [phone]   04/25/2022 Assessment:  smokes 10 per day.  Feels like she stress smokes. Interventions: Reviewed smoking cessation and motivation. Plan: Follow up in 1 month.  [name] RN, BSN, CEN RN Case Freight forwarder for [facility] Mobile: [phone]           Our next appointment is  in person  on 05/20/2022 at 10am  Please call the care guide team at [phone] if you need to cancel or reschedule your appointment.   If you are experiencing a Mental Health or [place] or need someone to talk to, please call the Suicide and Crisis Lifeline: 988 call the [place] National Suicide Prevention Lifeline: (602)002-1319 or TTY: 406-195-9674 TTY 912-848-8806) to talk to a trained counselor call 1-800-273-TALK (toll free, 24 hour hotline) go to [facility] [place] [phone]) call 911  The patient verbalized understanding of instructions, educational materials, and care plan provided today and agreed to receive a mailed copy of patient instructions, educational materials, and care plan.   [name] RN, BSN, Careers information officer for [employer] Mobile: [phone]

## 2022-05-04 DIAGNOSIS — I10 Essential (primary) hypertension: Secondary | ICD-10-CM | POA: Diagnosis not present

## 2022-05-04 DIAGNOSIS — Z1211 Encounter for screening for malignant neoplasm of colon: Secondary | ICD-10-CM | POA: Diagnosis not present

## 2022-05-04 DIAGNOSIS — F411 Generalized anxiety disorder: Secondary | ICD-10-CM | POA: Diagnosis not present

## 2022-05-04 DIAGNOSIS — F329 Major depressive disorder, single episode, unspecified: Secondary | ICD-10-CM | POA: Diagnosis not present

## 2022-05-04 DIAGNOSIS — F17210 Nicotine dependence, cigarettes, uncomplicated: Secondary | ICD-10-CM | POA: Diagnosis not present

## 2022-05-04 DIAGNOSIS — K219 Gastro-esophageal reflux disease without esophagitis: Secondary | ICD-10-CM | POA: Diagnosis not present

## 2022-05-04 DIAGNOSIS — R61 Generalized hyperhidrosis: Secondary | ICD-10-CM | POA: Diagnosis not present

## 2022-05-04 DIAGNOSIS — M545 Low back pain, unspecified: Secondary | ICD-10-CM | POA: Diagnosis not present

## 2022-05-23 ENCOUNTER — Telehealth: Payer: Self-pay

## 2022-05-23 NOTE — Patient Outreach (Signed)
Aging Gracefully Program  05/23/2022  Tracey Buchanan [birth year] [id number]   Placed call to patient to reschedule appointment. Offered 05/27/2022 at 115 am and patient accepted.  [name] RN, BSN, Careers information officer for [employer] Mobile: [phone]

## 2022-05-24 NOTE — Progress Notes (Unsigned)
GYNECOLOGY  VISIT   HPI: 66 y.o.   Single  {Race/ethnicity:17218}  female   G2P0020 with No LMP recorded. Patient has had a hysterectomy.   here for     GYNECOLOGIC HISTORY: No LMP recorded. Patient has had a hysterectomy. Contraception:  Hyst Menopausal hormone therapy:  none Last mammogram:  11-11-21 Neg/Birads1 Last pap smear:  12-19-19 Neg, 08-09-17 Neg, 06-03-14 Neg.  Hx of VAIIN 09/2010        OB History     Gravida  2   Para      Term      Preterm      AB  2   Living  0      SAB      IAB      Ectopic      Multiple      Live Births                 Patient Active Problem List   Diagnosis Date Noted   Atypical chest pain 08/22/2017   Anxiety    Hypertension    DVT (deep venous thrombosis) (HCC)    Osteopenia    Hydrosalpinx     Past Medical History:  Diagnosis Date   Anxiety    ASCUS (atypical squamous cells of undetermined significance) on Pap smear 11/2011   negative high risk HPV   DVT (deep venous thrombosis) ([place])    on birth control pills as a teenager   Hydrosalpinx    Hypertension    Osteopenia 09/2017   T score -2.0 FRAX 4.5% / 0.9%   Urinary incontinence    VAIN I (vaginal intraepithelial neoplasia grade I) 11/2010   negative colposcopy   Vitamin D deficiency 06/2015   value 13 recommended 50,000 units weekly 12 with recheck of vitamin D level    Past Surgical History:  Procedure Laterality Date   ABDOMINAL HYSTERECTOMY  2001   Leiomyomata   CARDIAC CATHETERIZATION     MYOMECTOMY ABDOMINAL APPROACH  1996   PELVIC LAPAROSCOPY      Current Outpatient Medications  Medication Sig Dispense Refill   atorvastatin (LIPITOR) 10 MG tablet Take 10 mg by mouth daily.     FLUoxetine (PROZAC) 10 MG capsule Take 3 capsules (30 mg total) by mouth daily. 90 capsule 0   FLUoxetine (PROZAC) 20 MG capsule Take 1 capsule (20 mg total) by mouth daily. (Patient not taking: Reported on 02/14/2022) 30 capsule 12   ibuprofen (ADVIL,MOTRIN) 800 MG  tablet Take 1 tablet (800 mg total) by mouth every 8 (eight) hours as needed. (Patient not taking: Reported on 02/14/2022) 30 tablet 1   LORazepam (ATIVAN) 0.5 MG tablet Take 1 tablet (0.5 mg total) by mouth every 8 (eight) hours. For anxiety (Patient taking differently: Take 0.5 mg by mouth every 4 (four) hours as needed for anxiety. For anxiety) 30 tablet 2   olmesartan-hydrochlorothiazide (BENICAR HCT) 40-25 MG per tablet Take 1 tablet by mouth daily.       traMADol (ULTRAM) 50 MG tablet Take 1 tablet (50 mg total) by mouth every 6 (six) hours as needed. 1-2 tablets every 6 hours as needed for pain 20 tablet 0   No current facility-administered medications for this visit.     ALLERGIES: Cymbalta [duloxetine hcl], Effexor [venlafaxine], Flexeril [cyclobenzaprine], Prevacid [lansoprazole], Prilosec [omeprazole], Wellbutrin xl [bupropion], and Codeine  Family History  Problem Relation Age of Onset   Diabetes Mother    Heart disease Mother    Diabetes Father  Hypertension Father    Diabetes Brother    Hypertension Brother    Sarcoidosis Brother    Sarcoidosis Brother    Diabetes Brother     Social History   Socioeconomic History   Marital status: Single    Spouse name: Not on file   Number of children: Not on file   Years of education: Not on file   Highest education level: Not on file  Occupational History   Not on file  Tobacco Use   Smoking status: Every Day    Packs/day: 0.25    Types: Cigarettes   Smokeless tobacco: Never  Vaping Use   Vaping Use: Never used  Substance and Sexual Activity   Alcohol use: Yes    Alcohol/week: 7.0 standard drinks of alcohol    Types: 7 Glasses of wine per week    Comment: takes 2 shots every other day   Drug use: No   Sexual activity: Not Currently    Birth control/protection: Surgical    Comment: 1st intercourse 19 yo-5 partners  Other Topics Concern   Not on file  Social History Narrative   Not on file   Social Determinants  of Health   Financial Resource Strain: Not on file  Food Insecurity: Not on file  Transportation Needs: Not on file  Physical Activity: Not on file  Stress: Not on file  Social Connections: Not on file  Intimate Partner Violence: Not on file    Review of Systems  PHYSICAL EXAMINATION:    There were no vitals taken for this visit.    General appearance: alert, cooperative and appears stated age Head: Normocephalic, without obvious abnormality, atraumatic Neck: no adenopathy, supple, symmetrical, trachea midline and thyroid normal to inspection and palpation Lungs: clear to auscultation bilaterally Breasts: normal appearance, no masses or tenderness, No nipple retraction or dimpling, No nipple discharge or bleeding, No axillary or supraclavicular adenopathy Heart: regular rate and rhythm Abdomen: soft, non-tender, no masses,  no organomegaly Extremities: extremities normal, atraumatic, no cyanosis or edema Skin: Skin color, texture, turgor normal. No rashes or lesions Lymph nodes: Cervical, supraclavicular, and axillary nodes normal. No abnormal inguinal nodes palpated Neurologic: Grossly normal  Pelvic: External genitalia:  no lesions              Urethra:  normal appearing urethra with no masses, tenderness or lesions              Bartholins and Skenes: normal                 Vagina: normal appearing vagina with normal color and discharge, no lesions              Cervix: no lesions                Bimanual Exam:  Uterus:  normal size, contour, position, consistency, mobility, non-tender              Adnexa: no mass, fullness, tenderness              Rectal exam: {yes no:314532}.  Confirms.              Anus:  normal sphincter tone, no lesions  Chaperone was present for exam:  ***  ASSESSMENT     PLAN     An After Visit Summary was printed and given to the patient.  ______ minutes face to face time of which over 50% was spent in counseling.

## 2022-05-25 ENCOUNTER — Encounter: Payer: Self-pay | Admitting: Obstetrics and Gynecology

## 2022-05-25 ENCOUNTER — Ambulatory Visit: Payer: Medicare HMO | Admitting: Obstetrics and Gynecology

## 2022-05-25 ENCOUNTER — Telehealth: Payer: Self-pay | Admitting: Obstetrics and Gynecology

## 2022-05-25 ENCOUNTER — Ambulatory Visit: Payer: Self-pay | Admitting: Obstetrics and Gynecology

## 2022-05-25 VITALS — BP 124/78 | Ht 66.0 in | Wt 200.0 lb

## 2022-05-25 DIAGNOSIS — N644 Mastodynia: Secondary | ICD-10-CM

## 2022-05-25 DIAGNOSIS — N6452 Nipple discharge: Secondary | ICD-10-CM

## 2022-05-25 DIAGNOSIS — R079 Chest pain, unspecified: Secondary | ICD-10-CM

## 2022-05-25 NOTE — Telephone Encounter (Addendum)
Scheduled on 06/29/22 @ 11:15 am for the below imaging.   2. Referral placed at [facility] office scheduled on 06/28/22

## 2022-05-25 NOTE — Telephone Encounter (Signed)
Please make 2 appointments for my patient:  1 - Bilateral dx mammogram and bilateral breast US at [facility].  She has right nipple discharge and bilateral breast pain.   2 - Referral to Dr. Quay Burow, Cardiology, for recurrent chest pain.  She has seen him in the past.   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   She states she is available for an appointment on Wednesday next week or Friday the following week.

## 2022-05-27 ENCOUNTER — Other Ambulatory Visit: Payer: Self-pay

## 2022-05-27 NOTE — Patient Instructions (Signed)
Visit Information  Thank you for taking time to visit with me today. Please don't hesitate to contact me if I can be of assistance to you before our next scheduled telephone appointment.  Following are the goals we discussed today:   Goals Addressed             This Visit's Progress    Client will report improvement in pain level as evidence by pain level decrease by at least 1-2 points on the pain scale       Aging Gracefully RN Goal  Goals: Take medications as recommended Attend provider visits as scheduled Ice may help with swelling Heat may help with stiffness Attend outpatient therapy and perform exercises as recommended  03/21/2022 Assessment: Pain level of 5/10. Reports decreased pain since cortisone injection.  Reports she is doing her home exercises 4-5 times per week.  Interventions: assessed pain. Reviewed OTC medications. Reviewed heat and ice treatment. Provided another home exercise plan and encouraged patient to complete daily.  Plan: next folllow up home visit is planned for 04/25/2022  [name] RN, BSN, CEN RN Case Manager for [employer]: [phone]   04/25/2022  Assessment:  Continues to have pain.  Reports she is more sore today.  Reports that she is doing her home exercises and is feeling stronger.  Interventions:  Encouraged patient to continue to do her home exercises.  Reviewed fall preventions.  Plan: Folllow up in 1 month.  [name] RN, BSN, CEN RN Case Freight forwarder for [place] [employer]: [phone]    05/27/2022 Assessment: Continues to have chronic pain. Worse with change in weather. Reports that she does her exercises 4 times per week.  Interventions: reviewed fall prevention and encouraged patient to continue to do home exercises in the future. Reviewed OTC medications like tylenol for pain.  Plan: AG home visits completed.   [name] RN, BSN, CEN RN Case  Freight forwarder for [facility] [employer]: [phone]            If you are experiencing a Mental Health or [place] or need someone to talk to, please call the Suicide and Crisis Lifeline: 988 call the [place] National Suicide Prevention Lifeline: 910-739-9272 or TTY: 936-685-9569 TTY 416 180 0111) to talk to a trained counselor call 1-800-273-TALK (toll free, 24 hour hotline) go to [facility] [address] [place] [phone]) call 911   The patient verbalized understanding of instructions, educational materials, and care plan provided today and agreed to receive a mailed copy of patient instructions, educational materials, and care plan.   [name] RN, BSN, Careers information officer for [employer]: [phone]

## 2022-05-27 NOTE — Patient Outreach (Signed)
Aging Gracefully Program  RN Visit  05/27/2022  Tracey Buchanan [birth year] [id number]  Visit:   RN home visit #4  Start Time:   1115 End Time:   1200 Total Minutes:   19  Readiness To Change Score:     Universal RN Interventions: Clinician View Of Client Situation: Arrived and patient came  to the door without difficulty. Client View Of His/Her Situation: Patient reports that she is doing well. Reports that she continues to have pain but no changes in paain. Continues to use heating pad. Reports she continues to do her home exercises without difficulty.  Reports no recent falls. Reports no changes to medications. Pending diagnositc mammogram in 1 month due to drainage from nippple. No history of breast cancer.  Patient reports that she is still trying to decide about the walk in shower.  High stress level since losing her life long dog. Patient reports that she has an appointment with Lattie Haw at [facility] for next week.  Healthcare Provider Communication: Did Higher education careers adviser With [employer] Provider?: No According to Client, Did PCP Report Communication With An Aging Gracefully RN?: No  Clinician View of Client Situation: Clinician View Of Client Situation: Arrived and patient came  to the door without difficulty. Client's View of His/Her Situation: Client View Of His/Her Situation: Patient reports that she is doing well. Reports that she continues to have pain but no changes in paain. Continues to use heating pad. Reports she continues to do her home exercises without difficulty.  Reports no recent falls. Reports no changes to medications. Pending diagnositc mammogram in 1 month due to drainage from nippple. No history of breast cancer.  Patient reports that she is still trying to decide about the walk in shower.  High stress level since losing her life long dog. Patient reports that she has an appointment with Lattie Haw at [facility] for next  week.  Medication Assessment: denies any changes to medications    OT Update: pending home modifications  Session Summary: dong well, has completed RN home visits. No falls.  Reviewed with patient to keep appointment with [facility] to get modifications started.    Goals Addressed             This Visit's Progress    Client will report improvement in pain level as evidence by pain level decrease by at least 1-2 points on the pain scale       Aging Gracefully RN Goal  Goals: Take medications as recommended Attend provider visits as scheduled Ice may help with swelling Heat may help with stiffness Attend outpatient therapy and perform exercises as recommended  03/21/2022 Assessment: Pain level of 5/10. Reports decreased pain since cortisone injection.  Reports she is doing her home exercises 4-5 times per week.  Interventions: assessed pain. Reviewed OTC medications. Reviewed heat and ice treatment. Provided another home exercise plan and encouraged patient to complete daily.  Plan: next folllow up home visit is planned for 04/25/2022  [name] RN, BSN, CEN RN Case Manager for [place] [employer]: [phone]   04/25/2022  Assessment:  Continues to have pain.  Reports she is more sore today.  Reports that she is doing her home exercises and is feeling stronger.  Interventions:  Encouraged patient to continue to do her home exercises.  Reviewed fall preventions.  Plan: Folllow up in 1 month.  [name] RN, BSN, Careers information officer for [employer]  Mobile: [phone]    05/27/2022 Assessment: Continues to have chronic pain. Worse with change in weather. Reports that she does her exercises 4 times per week.  Interventions: reviewed fall prevention and encouraged patient to continue to do home exercises in the future. Reviewed OTC medications like tylenol for pain.  Plan: AG home visits  completed.   [name] RN, BSN, Careers information officer for [employer]: [phone]         [name] RN, BSN, Careers information officer for [employer]: [phone]

## 2022-05-30 DIAGNOSIS — L209 Atopic dermatitis, unspecified: Secondary | ICD-10-CM | POA: Diagnosis not present

## 2022-05-31 NOTE — Telephone Encounter (Signed)
Encounter reviewed and closed.  

## 2022-06-15 DIAGNOSIS — F411 Generalized anxiety disorder: Secondary | ICD-10-CM | POA: Diagnosis not present

## 2022-06-15 DIAGNOSIS — M549 Dorsalgia, unspecified: Secondary | ICD-10-CM | POA: Diagnosis not present

## 2022-06-15 DIAGNOSIS — L259 Unspecified contact dermatitis, unspecified cause: Secondary | ICD-10-CM | POA: Diagnosis not present

## 2022-06-28 ENCOUNTER — Encounter: Payer: Self-pay | Admitting: Cardiovascular Disease

## 2022-06-28 ENCOUNTER — Ambulatory Visit: Payer: Medicare HMO | Attending: Cardiovascular Disease | Admitting: Cardiovascular Disease

## 2022-06-28 VITALS — BP 130/68 | HR 69 | Ht 66.0 in | Wt 207.0 lb

## 2022-06-28 DIAGNOSIS — I10 Essential (primary) hypertension: Secondary | ICD-10-CM | POA: Diagnosis not present

## 2022-06-28 DIAGNOSIS — E782 Mixed hyperlipidemia: Secondary | ICD-10-CM

## 2022-06-28 DIAGNOSIS — R0789 Other chest pain: Secondary | ICD-10-CM | POA: Diagnosis not present

## 2022-06-28 DIAGNOSIS — R072 Precordial pain: Secondary | ICD-10-CM

## 2022-06-28 DIAGNOSIS — E785 Hyperlipidemia, unspecified: Secondary | ICD-10-CM | POA: Insufficient documentation

## 2022-06-28 MED ORDER — METOPROLOL TARTRATE 100 MG PO TABS: 100.0000 mg | ORAL_TABLET | Freq: Once | ORAL | 0 refills | Status: DC

## 2022-06-28 NOTE — Patient Instructions (Addendum)
Medication Instructions:  Your physician recommends that you continue on your current medications as directed. Please refer to the Current Medication list given to you today.  *If you need a refill on your cardiac medications before your next appointment, please call your pharmacy*   Lab Work: Your physician recommends that you have labs drawn today: BMET  If you have labs (blood work) drawn today and your tests are completely normal, you will receive your results only by: [place] (if you have MyChart) OR A paper copy in the mail If you have any lab test that is abnormal or we need to change your treatment, we will call you to review the results.   Testing/Procedures: Your physician has requested that you have an echocardiogram. Echocardiography is a painless test that uses sound waves to create images of your heart. It provides your doctor with information about the size and shape of your heart and how well your heart's chambers and valves are working. This procedure takes approximately one hour. There are no restrictions for this procedure. Please do NOT wear cologne, perfume, aftershave, or lotions (deodorant is allowed). Please arrive 15 minutes prior to your appointment time. This procedure will be done at [address]   Follow-Up: At [facility], you and your health needs are our priority.  As part of our continuing mission to provide you with exceptional heart care, we have created designated Provider Care Teams.  These Care Teams include your primary Cardiologist (physician) and Advanced Practice Providers (APPs -  Physician Assistants and Nurse Practitioners) who all work together to provide you with the care you need, when you need it.  We recommend signing up for the patient portal called "MyChart".  Sign up information is provided on this After Visit Summary.  MyChart is used to connect with patients for Virtual Visits (Telemedicine).  Patients are  able to view lab/test results, encounter notes, upcoming appointments, etc.  Non-urgent messages can be sent to your provider as well.   To learn more about what you can do with MyChart, go to [email or web address].    Your next appointment:   We will see you on an as needed basis.   Provider:   Quay Burow, MD   Other Instructions   Your cardiac CT will be scheduled at the below location:   [facility] [address] [place], [place] [address] [phone]   If scheduled at [facility], please arrive at the [facility] and Children's Entrance (Entrance C2) of [facility] 30 minutes prior to test start time. You can use the FREE valet parking offered at entrance C (encouraged to control the heart rate for the test)  Proceed to the [facility] Radiology Department (first floor) to check-in and test prep.  All radiology patients and guests should use entrance C2 at [facility], accessed from [facility], even though the hospital's physical address listed is [address].     Please follow these instructions carefully (unless otherwise directed):    On the Night Before the Test: Be sure to Drink plenty of water. Do not consume any caffeinated/decaffeinated beverages or chocolate 12 hours prior to your test. Do not take any antihistamines 12 hours prior to your test.  On the Day of the Test: Drink plenty of water until 1 hour prior to the test. Do not eat any food 1 hour prior to test. You may take your regular medications prior to  the test.  Take metoprolol (Lopressor)'100mg'$  two hours prior to test. HOLD Furosemide/Hydrochlorothiazide (Benicar) morning of the test. FEMALES- please wear underwire-free bra if available, avoid dresses & tight clothing       After the Test: Drink plenty of water. After receiving IV contrast, you may experience a mild flushed feeling. This is normal. On occasion, you may experience  a mild rash up to 24 hours after the test. This is not dangerous. If this occurs, you can take Benadryl 25 mg and increase your fluid intake. If you experience trouble breathing, this can be serious. If it is severe call 911 IMMEDIATELY. If it is mild, please call our office. If you take any of these medications: Glipizide/Metformin, Avandament, Glucavance, please do not take 48 hours after completing test unless otherwise instructed.  We will call to schedule your test 2-4 weeks out understanding that some insurance companies will need an authorization prior to the service being performed.   For non-scheduling related questions, please contact the cardiac imaging nurse navigator should you have any questions/concerns: Marchia Bond, Cardiac Imaging Nurse Navigator Gordy Clement, Cardiac Imaging Nurse Navigator [facility] Direct Office Dial: [phone]   For scheduling needs, including cancellations and rescheduling, please call [place], [phone].

## 2022-06-28 NOTE — Assessment & Plan Note (Signed)
History of essential hypertension a blood pressure measured today at 130/68.  She is on amlodipine, Benicar and hydrochlorothiazide.

## 2022-06-28 NOTE — Assessment & Plan Note (Signed)
History of hyperlipidemia on statin therapy which unfortunately she does not take.  Her most recent lipid profile performed 11/10/2021 revealed total cholesterol 195, LDL of 112 and HDL 53.

## 2022-06-28 NOTE — Progress Notes (Signed)
06/28/2022 Tracey Buchanan   1956/07/28  [id number]  Primary Physician Donald Prose, MD Primary Cardiologist: Lorretta Harp MD FACP, [place], [place], [place]  HPI:  Tracey Buchanan is a 66 y.o.  mild to moderately overweight single African-American female no children who was self-referred for evaluation of chest pain. She worked as an Art gallery manager for [employer], as well as the [place] [employer] [place] but has since retired to take care of her 59 year old mother.. Risk factors include continued tobacco abuse of one third of a pack a day for the last 40 years as well as history of hypertension. Her mother did have a CABG. She has never had a heart attack or stroke. She's had chest pain for last 6 weeks which is somewhat atypical. Mostly occurs at night or in the morning.  As result of her chest pain I did get a Myoview stress test on her 09/08/2017 which was entirely normal.  She recently saw her OB/GYN and mentioned this chest pain and was referred here for further evaluation.  She gets the pain several times a week which usually awakens her from sleep, begins in her back and radiates to her chest lasting up to 10 minutes at a time associate with shortness of breath.  It usually does not occur during the daylight hours or while she is performing activities.   Current Meds  Medication Sig   amLODipine (NORVASC) 10 MG tablet    atorvastatin (LIPITOR) 10 MG tablet Take 10 mg by mouth daily.   ibuprofen (ADVIL,MOTRIN) 800 MG tablet Take 1 tablet (800 mg total) by mouth every 8 (eight) hours as needed.   LORazepam (ATIVAN) 0.5 MG tablet Take 1 tablet (0.5 mg total) by mouth every 8 (eight) hours. For anxiety (Patient taking differently: Take 0.5 mg by mouth every 4 (four) hours as needed for anxiety. For anxiety)   olmesartan-hydrochlorothiazide (BENICAR HCT) 40-25 MG per tablet Take 1 tablet by mouth daily.     traMADol (ULTRAM) 50 MG tablet Take 1 tablet (50 mg total) by mouth every 6 (six)  hours as needed. 1-2 tablets every 6 hours as needed for pain     Allergies  Allergen Reactions   Cymbalta [Duloxetine Hcl] Nausea Only   Effexor [Venlafaxine]     nightmares   Flexeril [Cyclobenzaprine]     Urinary frequency   Prevacid [Lansoprazole]     Lack of therapeutic effect   Prilosec [Omeprazole]     Lack of therapeutic effect   Wellbutrin Xl [Bupropion]     nightmares   Codeine Palpitations    Social History   Socioeconomic History   Marital status: Single    Spouse name: Not on file   Number of children: Not on file   Years of education: Not on file   Highest education level: Not on file  Occupational History   Not on file  Tobacco Use   Smoking status: Every Day    Packs/day: 0.25    Types: Cigarettes   Smokeless tobacco: Never  Vaping Use   Vaping Use: Never used  Substance and Sexual Activity   Alcohol use: Yes    Alcohol/week: 7.0 standard drinks of alcohol    Types: 7 Glasses of wine per week    Comment: takes 2 shots every other day   Drug use: No   Sexual activity: Not Currently    Birth control/protection: Surgical    Comment: 1st intercourse 19 yo-5 partners  Other Topics Concern  Not on file  Social History Narrative   Not on file   Social Determinants of Health   Financial Resource Strain: Not on file  Food Insecurity: Not on file  Transportation Needs: Not on file  Physical Activity: Not on file  Stress: Not on file  Social Connections: Not on file  Intimate Partner Violence: Not on file     Review of Systems: General: negative for chills, fever, night sweats or weight changes.  Cardiovascular: negative for chest pain, dyspnea on exertion, edema, orthopnea, palpitations, paroxysmal nocturnal dyspnea or shortness of breath Dermatological: negative for rash Respiratory: negative for cough or wheezing Urologic: negative for hematuria Abdominal: negative for nausea, vomiting, diarrhea, bright red blood per rectum, melena, or  hematemesis Neurologic: negative for visual changes, syncope, or dizziness All other systems reviewed and are otherwise negative except as noted above.    Blood pressure 130/68, pulse 69, height '5\' 6"'$  (1.676 m), weight 207 lb (93.9 kg), SpO2 96 %.  General appearance: alert and no distress Neck: no adenopathy, no carotid bruit, no JVD, supple, symmetrical, trachea midline, and thyroid not enlarged, symmetric, no tenderness/mass/nodules Lungs: clear to auscultation bilaterally Heart: regular rate and rhythm, S1, S2 normal, no murmur, click, rub or gallop Extremities: extremities normal, atraumatic, no cyanosis or edema Pulses: 2+ and symmetric Skin: Skin color, texture, turgor normal. No rashes or lesions Neurologic: Grossly normal  EKG sinus rhythm at 69 with nonspecific ST and T wave changes.  Personally reviewed this EKG.  ASSESSMENT AND PLAN:   Hypertension History of essential hypertension a blood pressure measured today at 130/68.  She is on amlodipine, Benicar and hydrochlorothiazide.  Atypical chest pain History of atypical chest pain for which I saw her back in 2018.  I did perform a Myoview stress test on her at that time which was low risk and nonischemic.  Hyperlipidemia History of hyperlipidemia on statin therapy which unfortunately she does not take.  Her most recent lipid profile performed 11/10/2021 revealed total cholesterol 195, LDL of 112 and HDL 53.     Lorretta Harp MD [facility], [facility] 06/28/2022 11:14 AM

## 2022-06-28 NOTE — Assessment & Plan Note (Signed)
History of atypical chest pain for which I saw her back in 2018.  I did perform a Myoview stress test on her at that time which was low risk and nonischemic.

## 2022-06-29 ENCOUNTER — Other Ambulatory Visit: Payer: Self-pay | Admitting: Obstetrics and Gynecology

## 2022-06-29 ENCOUNTER — Ambulatory Visit
Admission: RE | Admit: 2022-06-29 | Discharge: 2022-06-29 | Disposition: A | Discharge status: Home / Self Care | Payer: Medicare HMO | Source: Ambulatory Visit | Attending: Obstetrics and Gynecology | Admitting: Obstetrics and Gynecology

## 2022-06-29 ENCOUNTER — Ambulatory Visit: Payer: Medicare HMO

## 2022-06-29 DIAGNOSIS — N6452 Nipple discharge: Secondary | ICD-10-CM | POA: Diagnosis not present

## 2022-06-29 DIAGNOSIS — N644 Mastodynia: Secondary | ICD-10-CM | POA: Diagnosis not present

## 2022-06-30 DIAGNOSIS — E782 Mixed hyperlipidemia: Secondary | ICD-10-CM | POA: Diagnosis not present

## 2022-06-30 DIAGNOSIS — R0789 Other chest pain: Secondary | ICD-10-CM | POA: Diagnosis not present

## 2022-06-30 DIAGNOSIS — I10 Essential (primary) hypertension: Secondary | ICD-10-CM | POA: Diagnosis not present

## 2022-06-30 DIAGNOSIS — R072 Precordial pain: Secondary | ICD-10-CM | POA: Diagnosis not present

## 2022-06-30 DIAGNOSIS — Z1211 Encounter for screening for malignant neoplasm of colon: Secondary | ICD-10-CM | POA: Diagnosis not present

## 2022-07-01 LAB — BASIC METABOLIC PANEL
BUN/Creatinine Ratio: 12 (ref 12–28)
BUN: 12 mg/dL (ref 8–27)
CO2: 21 mmol/L (ref 20–29)
Calcium: 9.3 mg/dL (ref 8.7–10.3)
Chloride: 105 mmol/L (ref 96–106)
Creatinine, Ser: 0.99 mg/dL (ref 0.57–1.00)
Glucose: 93 mg/dL (ref 70–99)
Potassium: 3.9 mmol/L (ref 3.5–5.2)
Sodium: 140 mmol/L (ref 134–144)
eGFR: 63 mL/min/{1.73_m2} (ref >59)

## 2022-07-04 ENCOUNTER — Other Ambulatory Visit: Payer: Self-pay | Admitting: Occupational Therapy

## 2022-07-04 NOTE — Patient Outreach (Signed)
A[facility] Program  OT Follow-Up Visit  07/04/2022  Tracey Buchanan 10-14-1955 [id number]  Visit:  3- Third Visit  Start Time:  0900 End Time:  0945 Total Minutes:  60       Readiness to Change Score :  Readiness to Change Score: 7.33   Durable Medical Equipment: Adaptive Equipment: Reacher Adaptive Equipment Distribution Date: 07/04/22  Patient Education: Education Provided: Yes Education Details: Operation and use of Chief of Staff) Educated: Patient Comprehension: Verbalized Understanding, Returned Demonstration  Goals:   Goals Addressed             This Visit's Progress    COMPLETED: Patient Stated       Patient will improve ability to grasp and pick up items from the floor using AE as needed.   ACTION PLANNING - FUNCTIONAL MOBILITY Target Problem Area: Functional reaching tasks  Why Problem May Occur:  -Limited mobility -Limited ability to get up/down from floor     Target Goal: Safety and improved ease with functional reaching   STRATEGIES Saving Your Energy: DO: DON'T:  Place frequently used items at waist level Put frequently used items up high  Use a reacher Stand on unstable stools  Modifying your home environment and making it safe: DO: DON'T:  Move heavy items down low   Remove or strongly secure throw rugs    Hold onto a sturdy surface if bending down    Practice It is important to practice the strategies so we can determine if they will be effective in helping to reach your goal. Follow these specific recommendations: Use a reacher 2.   Rearrange items in cabinets for improved ease of reaching  If a strategy does not work the first time, try it again and again (and maybe again). We may make some changes over the next few sessions, based on how they work.   [name], OTR/L  [phone]            Post Clinical Reasoning: Client Action (Goal) Two Interventions: Patient will improve ability to grasp and  pick up items from the floor using AE as needed. Did Client Try?: Yes Targeted Problem Area Status: A Lot Better  Clinician View Of Client Situation:: Client has decided she does not want the walk in shower because it only has a 1 year warranty on it. She is going to have higher commodes installed and grab bars put in her shower. She has not signed the final paperwork yet. OT provided reacher this date, instructing in operation. Pt somewhat down due to her dog passing away.  Client View Of His/Her Situation:: Reporting some mild knee pain, has been deciding what she wants [facility] to complete and is going to sign the paperwork. Client is happy with reacher, demonstrating appropriate operation.  Next Visit Plan:: Provide shower seat, discharge   [name], OTR/L  [phone] 07/04/22

## 2022-07-06 DIAGNOSIS — G459 Transient cerebral ischemic attack, unspecified: Secondary | ICD-10-CM

## 2022-07-06 HISTORY — DX: Transient cerebral ischemic attack, unspecified: G45.9

## 2022-07-11 ENCOUNTER — Ambulatory Visit (HOSPITAL_COMMUNITY): Payer: Medicare HMO

## 2022-07-14 ENCOUNTER — Other Ambulatory Visit (HOSPITAL_COMMUNITY): Payer: Medicare HMO

## 2022-07-27 ENCOUNTER — Observation Stay (HOSPITAL_COMMUNITY): Payer: Medicare HMO

## 2022-07-27 ENCOUNTER — Observation Stay (HOSPITAL_BASED_OUTPATIENT_CLINIC_OR_DEPARTMENT_OTHER)
Admission: EM | Admit: 2022-07-27 | Discharge: 2022-07-28 | Disposition: A | Discharge status: Home / Self Care | Payer: Medicare HMO | Attending: Family Medicine | Admitting: Family Medicine

## 2022-07-27 ENCOUNTER — Encounter (HOSPITAL_COMMUNITY): Payer: Self-pay

## 2022-07-27 ENCOUNTER — Other Ambulatory Visit: Payer: Self-pay

## 2022-07-27 ENCOUNTER — Emergency Department (HOSPITAL_BASED_OUTPATIENT_CLINIC_OR_DEPARTMENT_OTHER): Payer: Medicare HMO

## 2022-07-27 ENCOUNTER — Encounter (HOSPITAL_BASED_OUTPATIENT_CLINIC_OR_DEPARTMENT_OTHER): Payer: Self-pay | Admitting: Emergency Medicine

## 2022-07-27 DIAGNOSIS — R29818 Other symptoms and signs involving the nervous system: Secondary | ICD-10-CM

## 2022-07-27 DIAGNOSIS — H8110 Benign paroxysmal vertigo, unspecified ear: Secondary | ICD-10-CM

## 2022-07-27 DIAGNOSIS — Z86718 Personal history of other venous thrombosis and embolism: Secondary | ICD-10-CM | POA: Insufficient documentation

## 2022-07-27 DIAGNOSIS — I639 Cerebral infarction, unspecified: Secondary | ICD-10-CM | POA: Diagnosis not present

## 2022-07-27 DIAGNOSIS — Z7409 Other reduced mobility: Secondary | ICD-10-CM | POA: Insufficient documentation

## 2022-07-27 DIAGNOSIS — R42 Dizziness and giddiness: Secondary | ICD-10-CM | POA: Insufficient documentation

## 2022-07-27 DIAGNOSIS — M6281 Muscle weakness (generalized): Secondary | ICD-10-CM | POA: Diagnosis not present

## 2022-07-27 DIAGNOSIS — R2689 Other abnormalities of gait and mobility: Secondary | ICD-10-CM | POA: Diagnosis not present

## 2022-07-27 DIAGNOSIS — R531 Weakness: Secondary | ICD-10-CM

## 2022-07-27 DIAGNOSIS — R2681 Unsteadiness on feet: Secondary | ICD-10-CM | POA: Insufficient documentation

## 2022-07-27 DIAGNOSIS — I6621 Occlusion and stenosis of right posterior cerebral artery: Secondary | ICD-10-CM | POA: Diagnosis not present

## 2022-07-27 DIAGNOSIS — Z79899 Other long term (current) drug therapy: Secondary | ICD-10-CM | POA: Insufficient documentation

## 2022-07-27 DIAGNOSIS — R29898 Other symptoms and signs involving the musculoskeletal system: Secondary | ICD-10-CM

## 2022-07-27 DIAGNOSIS — G459 Transient cerebral ischemic attack, unspecified: Principal | ICD-10-CM | POA: Insufficient documentation

## 2022-07-27 DIAGNOSIS — F419 Anxiety disorder, unspecified: Secondary | ICD-10-CM | POA: Diagnosis present

## 2022-07-27 DIAGNOSIS — I10 Essential (primary) hypertension: Secondary | ICD-10-CM | POA: Diagnosis not present

## 2022-07-27 DIAGNOSIS — F17210 Nicotine dependence, cigarettes, uncomplicated: Secondary | ICD-10-CM | POA: Diagnosis not present

## 2022-07-27 DIAGNOSIS — Z720 Tobacco use: Secondary | ICD-10-CM | POA: Diagnosis not present

## 2022-07-27 DIAGNOSIS — E785 Hyperlipidemia, unspecified: Secondary | ICD-10-CM | POA: Diagnosis present

## 2022-07-27 LAB — COMPREHENSIVE METABOLIC PANEL
ALT: 21 U/L (ref 0–44)
AST: 20 U/L (ref 15–41)
Albumin: 4 g/dL (ref 3.5–5.0)
Alkaline Phosphatase: 92 U/L (ref 38–126)
Anion gap: 7 (ref 5–15)
BUN: 12 mg/dL (ref 8–23)
CO2: 26 mmol/L (ref 22–32)
Calcium: 9.1 mg/dL (ref 8.9–10.3)
Chloride: 108 mmol/L (ref 98–111)
Creatinine, Ser: 0.92 mg/dL (ref 0.44–1.00)
GFR, Estimated: >60 mL/min (ref >60)
Glucose, Bld: 116 mg/dL — ABNORMAL HIGH (ref 70–99)
Potassium: 3.6 mmol/L (ref 3.5–5.1)
Sodium: 141 mmol/L (ref 135–145)
Total Bilirubin: 0.4 mg/dL (ref 0.3–1.2)
Total Protein: 7.4 g/dL (ref 6.5–8.1)

## 2022-07-27 LAB — URINALYSIS, MICROSCOPIC (REFLEX)

## 2022-07-27 LAB — DIFFERENTIAL
Abs Immature Granulocytes: 0.02 10*3/uL (ref 0.00–0.07)
Basophils Absolute: 0 10*3/uL (ref 0.0–0.1)
Basophils Relative: 0 %
Eosinophils Absolute: 0.1 10*3/uL (ref 0.0–0.5)
Eosinophils Relative: 2 %
Immature Granulocytes: 0 %
Lymphocytes Relative: 29 %
Lymphs Abs: 2.2 10*3/uL (ref 0.7–4.0)
Monocytes Absolute: 0.6 10*3/uL (ref 0.1–1.0)
Monocytes Relative: 8 %
Neutro Abs: 4.5 10*3/uL (ref 1.7–7.7)
Neutrophils Relative %: 61 %

## 2022-07-27 LAB — URINALYSIS, ROUTINE W REFLEX MICROSCOPIC
Bilirubin Urine: NEGATIVE
Glucose, UA: NEGATIVE mg/dL
Ketones, ur: NEGATIVE mg/dL
Leukocytes,Ua: NEGATIVE
Nitrite: NEGATIVE
Protein, ur: NEGATIVE mg/dL
Specific Gravity, Urine: 1.02 (ref 1.005–1.030)
pH: 7 (ref 5.0–8.0)

## 2022-07-27 LAB — PROTIME-INR
INR: 0.9 (ref 0.8–1.2)
Prothrombin Time: 12.2 seconds (ref 11.4–15.2)

## 2022-07-27 LAB — RAPID URINE DRUG SCREEN, HOSP PERFORMED
Amphetamines: NOT DETECTED
Barbiturates: NOT DETECTED
Benzodiazepines: NOT DETECTED
Cocaine: NOT DETECTED
Opiates: NOT DETECTED
Tetrahydrocannabinol: POSITIVE — AB

## 2022-07-27 LAB — CBC
HCT: 42.7 % (ref 36.0–46.0)
Hemoglobin: 13.8 g/dL (ref 12.0–15.0)
MCH: 26.8 pg (ref 26.0–34.0)
MCHC: 32.3 g/dL (ref 30.0–36.0)
MCV: 82.9 fL (ref 80.0–100.0)
Platelets: 279 10*3/uL (ref 150–400)
RBC: 5.15 MIL/uL — ABNORMAL HIGH (ref 3.87–5.11)
RDW: 16.6 % — ABNORMAL HIGH (ref 11.5–15.5)
WBC: 7.5 10*3/uL (ref 4.0–10.5)
nRBC: 0 % (ref 0.0–0.2)

## 2022-07-27 LAB — TROPONIN I (HIGH SENSITIVITY)
Troponin I (High Sensitivity): 4 ng/L (ref <18)
Troponin I (High Sensitivity): 4 ng/L (ref <18)

## 2022-07-27 LAB — ETHANOL: Alcohol, Ethyl (B): <10 mg/dL (ref <10)

## 2022-07-27 LAB — APTT: aPTT: 30 seconds (ref 24–36)

## 2022-07-27 LAB — CBG MONITORING, ED: Glucose-Capillary: 121 mg/dL — ABNORMAL HIGH (ref 70–99)

## 2022-07-27 MED ORDER — HYDROCHLOROTHIAZIDE 25 MG PO TABS: 25.0000 mg | ORAL_TABLET | Freq: Every day | ORAL | Status: DC

## 2022-07-27 MED ORDER — OLMESARTAN MEDOXOMIL-HCTZ 40-25 MG PO TABS: 1.0000 | ORAL_TABLET | Freq: Every day | ORAL | Status: DC

## 2022-07-27 MED ORDER — ACETAMINOPHEN 325 MG PO TABS: 650.0000 mg | ORAL_TABLET | ORAL | Status: DC | PRN

## 2022-07-27 MED ORDER — IRBESARTAN 300 MG PO TABS: 300.0000 mg | ORAL_TABLET | Freq: Every day | ORAL | Status: DC

## 2022-07-27 MED ORDER — ONDANSETRON HCL 4 MG/2ML IJ SOLN
4.0000 mg | Freq: Four times a day (QID) | INTRAMUSCULAR | Status: DC | PRN
  Administered 2022-07-28: 4 mg via INTRAVENOUS
  Filled 2022-07-27: qty 2

## 2022-07-27 MED ORDER — SENNOSIDES-DOCUSATE SODIUM 8.6-50 MG PO TABS: 1.0000 | ORAL_TABLET | Freq: Every evening | ORAL | Status: DC | PRN

## 2022-07-27 MED ORDER — ACETAMINOPHEN 650 MG RE SUPP: 650.0000 mg | RECTAL | Status: DC | PRN

## 2022-07-27 MED ORDER — CLOPIDOGREL BISULFATE 75 MG PO TABS
75.0000 mg | ORAL_TABLET | Freq: Every day | ORAL | Status: DC
  Administered 2022-07-27 – 2022-07-28 (×2): 75 mg via ORAL
  Filled 2022-07-27 (×2): qty 1

## 2022-07-27 MED ORDER — FLUOXETINE HCL 20 MG PO CAPS
20.0000 mg | ORAL_CAPSULE | Freq: Every day | ORAL | Status: DC
  Administered 2022-07-28: 20 mg via ORAL
  Filled 2022-07-27: qty 1

## 2022-07-27 MED ORDER — HYDRALAZINE HCL 20 MG/ML IJ SOLN: 10.0000 mg | INTRAMUSCULAR | Status: DC | PRN

## 2022-07-27 MED ORDER — IOHEXOL 350 MG/ML SOLN
75.0000 mL | Freq: Once | INTRAVENOUS | Status: AC | PRN
  Administered 2022-07-27: 75 mL via INTRAVENOUS

## 2022-07-27 MED ORDER — ACETAMINOPHEN 160 MG/5ML PO SOLN: 650.0000 mg | ORAL | Status: DC | PRN

## 2022-07-27 MED ORDER — ASPIRIN 81 MG PO CHEW
324.0000 mg | CHEWABLE_TABLET | Freq: Once | ORAL | Status: AC
  Administered 2022-07-27: 324 mg via ORAL
  Filled 2022-07-27: qty 4

## 2022-07-27 MED ORDER — STROKE: EARLY STAGES OF RECOVERY BOOK
Freq: Once | Status: AC
  Filled 2022-07-27: qty 1

## 2022-07-27 MED ORDER — ATORVASTATIN CALCIUM 10 MG PO TABS
10.0000 mg | ORAL_TABLET | Freq: Every day | ORAL | Status: DC
  Administered 2022-07-28: 10 mg via ORAL
  Filled 2022-07-27: qty 1

## 2022-07-27 MED ORDER — ONDANSETRON HCL 4 MG/2ML IJ SOLN
4.0000 mg | Freq: Once | INTRAMUSCULAR | Status: AC
  Administered 2022-07-27: 4 mg via INTRAVENOUS
  Filled 2022-07-27: qty 2

## 2022-07-27 MED ORDER — ASPIRIN 81 MG PO TBEC
81.0000 mg | DELAYED_RELEASE_TABLET | Freq: Every day | ORAL | Status: DC
  Administered 2022-07-28: 81 mg via ORAL
  Filled 2022-07-27: qty 1

## 2022-07-27 MED ORDER — AMLODIPINE BESYLATE 10 MG PO TABS: 10.0000 mg | ORAL_TABLET | Freq: Every day | ORAL | Status: DC

## 2022-07-27 MED ORDER — ENOXAPARIN SODIUM 40 MG/0.4ML IJ SOSY
40.0000 mg | PREFILLED_SYRINGE | INTRAMUSCULAR | Status: DC
  Administered 2022-07-27: 40 mg via SUBCUTANEOUS
  Filled 2022-07-27: qty 0.4

## 2022-07-27 NOTE — ED Notes (Signed)
Pt complained of nausea EDP made aware and zofran ordered.

## 2022-07-27 NOTE — Progress Notes (Addendum)
Telestroke Therapist, sports - Code Stroke Note  4461- Code stroke activation via stroke cart- LKW 0100, L side weakness, unsteady and dizziness. EDP has already evaluated pt prior activation 1028-Pt taken to CT 1039-Pt returned from CT 1046-Dr Stack on camera for stroke assessment 1104- Not a candidate for TNK d/t LKW. Neuro signed off cart at this time NIH 1 for sensory

## 2022-07-27 NOTE — Assessment & Plan Note (Signed)
Hold home meds to allow permissive hypertension for now.

## 2022-07-27 NOTE — Hospital Course (Signed)
Tracey Buchanan is a 66 y.o. female with medical history significant for HTN, HLD who presented with transient left lower extremity weakness and admitted for CVA workup.

## 2022-07-27 NOTE — ED Notes (Signed)
Called [facility] for transport talked to [place] at 3:38

## 2022-07-27 NOTE — ED Notes (Signed)
Meal provided to pt

## 2022-07-27 NOTE — Assessment & Plan Note (Signed)
Presenting with transient LLE weakness.  Seen by teleneurology who recommended CVA workup.  Initial CT head negative for acute changes. -MRI brain without contrast -CTA head/neck -Echocardiogram -Aspirin 81 mg daily and Plavix 75 mg daily -Allow permissive hypertension for now -Check A1c, lipid panel -PT/OT/SLP eval -Keep on telemetry, continue neurochecks -Resume home atorvastatin 10 mg daily

## 2022-07-27 NOTE — Plan of Care (Signed)
  Problem: Education: Goal: Knowledge of General Education information will improve Description: Including pain rating scale, medication(s)/side effects and non-pharmacologic comfort measures Outcome: Progressing   Problem: Health Behavior/Discharge Planning: Goal: Ability to manage health-related needs will improve Outcome: Progressing   Problem: Clinical Measurements: Goal: Ability to maintain clinical measurements within normal limits will improve Outcome: Progressing Goal: Will remain free from infection Outcome: Progressing Goal: Diagnostic test results will improve Outcome: Progressing Goal: Respiratory complications will improve Outcome: Progressing Goal: Cardiovascular complication will be avoided Outcome: Progressing   Problem: Activity: Goal: Risk for activity intolerance will decrease Outcome: Progressing   Problem: Nutrition: Goal: Adequate nutrition will be maintained Outcome: Progressing   Problem: Coping: Goal: Level of anxiety will decrease Outcome: Progressing   Problem: Elimination: Goal: Will not experience complications related to bowel motility Outcome: Progressing Goal: Will not experience complications related to urinary retention Outcome: Progressing   Problem: Pain Managment: Goal: General experience of comfort will improve Outcome: Progressing   Problem: Safety: Goal: Ability to remain free from injury will improve Outcome: Progressing   Problem: Skin Integrity: Goal: Risk for impaired skin integrity will decrease Outcome: Progressing   Problem: Education: Goal: Knowledge of disease or condition will improve Outcome: Progressing Goal: Knowledge of secondary prevention will improve (MUST DOCUMENT ALL) Outcome: Progressing

## 2022-07-27 NOTE — ED Triage Notes (Signed)
Reports left side weakness this morning at 0730 Am , occurred first in bed when she was unable to turn in her bed , feeling dizzy . Headache that resolved prior to arrival . No vision changes .  Had alcoholic drink last night at 8 pm before bed .  Alert and oriented x 4 , ambulated to lobby .  Hx diabetes and HTN

## 2022-07-27 NOTE — ED Provider Notes (Signed)
[facility] Provider Note   CSN: [id number] Arrival date & time: 07/27/22  1006  An emergency department physician performed an initial assessment on this suspected stroke patient at 1020.  History  Chief Complaint  Patient presents with   Weakness   Code Stroke    Tracey Buchanan is a 66 y.o. female.  66 yo F With a chief complaint of not feeling well.  She tells me that she had developed feeling that she did not feel well after she had put a cooler in her car about 730 this morning.  She suddenly felt like she could not use her left leg well and was unable to ambulate on her own.  She eventually got a walker and was able to make it back into the bed and had some difficulty getting her leg to lift up off the bed when she had gotten there.  This has gotten a bit better and she is able to move the leg now but does feel like it is still a bit weak and still feels a bit dizzy and unsteady when she gets up to move around.  She had developed some indigestion earlier this morning as well that is gotten little bit better with some ginger ale.  Her father has a history of mini strokes in the past.  She thinks this severe weakness lasted for about 30 minutes or so.   Weakness      Home Medications Prior to Admission medications   Medication Sig Start Date End Date Taking? Authorizing Provider  amLODipine (NORVASC) 10 MG tablet  04/03/22   [provider]  atorvastatin (LIPITOR) 10 MG tablet Take 10 mg by mouth daily.    [provider]  FLUoxetine (PROZAC) 10 MG capsule Take 3 capsules (30 mg total) by mouth daily. Patient not taking: Reported on 05/25/2022 03/05/21   Nunzio Cobbs, MD  FLUoxetine (PROZAC) 20 MG capsule Take 1 capsule (20 mg total) by mouth daily. 08/10/18   Fontaine, Belinda Block, MD  ibuprofen (ADVIL,MOTRIN) 800 MG tablet Take 1 tablet (800 mg total) by mouth every 8 (eight) hours as needed. 05/02/16   Fontaine, Belinda Block, MD  LORazepam (ATIVAN) 0.5 MG tablet Take 1 tablet (0.5 mg total) by mouth every 8 (eight) hours. For anxiety Patient taking differently: Take 0.5 mg by mouth every 4 (four) hours as needed for anxiety. For anxiety 04/17/13   Fontaine, Belinda Block, MD  metoprolol tartrate (LOPRESSOR) 100 MG tablet Take 1 tablet (100 mg total) by mouth once for 1 dose. Take 2 hour prior to procedure. 06/28/22 06/28/22  Lorretta Harp, MD  olmesartan-hydrochlorothiazide (BENICAR HCT) 40-25 MG per tablet Take 1 tablet by mouth daily.      [provider]  traMADol (ULTRAM) 50 MG tablet Take 1 tablet (50 mg total) by mouth every 6 (six) hours as needed. 1-2 tablets every 6 hours as needed for pain 12/26/21   Charlesetta Shanks, MD      Allergies    Cymbalta [duloxetine hcl], Effexor [venlafaxine], Flexeril [cyclobenzaprine], Prevacid [lansoprazole], Prilosec [omeprazole], Wellbutrin xl [bupropion], and Codeine    Review of Systems   Review of Systems  Neurological:  Positive for weakness.    Physical Exam Updated Vital Signs BP (!) 160/75   Pulse 60   Temp 98.3 F (36.8 C)   Resp 14   Ht '5\' 6"'$  (1.676 m)   Wt 93.4 kg   SpO2 99%   BMI  33.25 kg/m  Physical Exam Vitals and nursing note reviewed.  Constitutional:      General: She is not in acute distress.    Appearance: She is well-developed. She is not diaphoretic.  HENT:     Head: Normocephalic and atraumatic.  Eyes:     Pupils: Pupils are equal, round, and reactive to light.  Cardiovascular:     Rate and Rhythm: Normal rate and regular rhythm.     Heart sounds: No murmur heard.    No friction rub. No gallop.  Pulmonary:     Effort: Pulmonary effort is normal.     Breath sounds: No wheezing or rales.  Abdominal:     General: There is no distension.     Palpations: Abdomen is soft.     Tenderness: There is no abdominal tenderness.  Musculoskeletal:        General: No tenderness.     Cervical back: Normal range of motion and neck  supple.  Skin:    General: Skin is warm and dry.  Neurological:     Mental Status: She is alert and oriented to person, place, and time.     Cranial Nerves: Cranial nerves 2-12 are intact.     Sensory: Sensation is intact.     Motor: Motor function is intact.     Coordination: Coordination is intact.     Comments: Left leg weakness compared to right 4 out of 5.  Psychiatric:        Behavior: Behavior normal.     ED Results / Procedures / Treatments   Labs (all labs ordered are listed, but only abnormal results are displayed) Labs Reviewed  CBC - Abnormal; Notable for the following components:      Result Value   RBC 5.15 (*)    RDW 16.6 (*)    All other components within normal limits  COMPREHENSIVE METABOLIC PANEL - Abnormal; Notable for the following components:   Glucose, Bld 116 (*)    All other components within normal limits  CBG MONITORING, ED - Abnormal; Notable for the following components:   Glucose-Capillary 121 (*)    All other components within normal limits  ETHANOL  PROTIME-INR  APTT  DIFFERENTIAL  RAPID URINE DRUG SCREEN, HOSP PERFORMED  URINALYSIS, ROUTINE W REFLEX MICROSCOPIC  TROPONIN I (HIGH SENSITIVITY)    EKG EKG Interpretation  Date/Time:  Wednesday July 27 2022 10:16:39 EST Ventricular Rate:  63 PR Interval:  145 QRS Duration: 94 QT Interval:  416 QTC Calculation: 426 R Axis:   54 Text Interpretation: Sinus rhythm Abnormal R-wave progression, early transition No significant change since last tracing Confirmed by Deno Etienne [phone]) on 07/27/2022 10:26:33 AM  Radiology CT HEAD CODE STROKE WO CONTRAST  Result Date: 07/27/2022 CLINICAL DATA:  Code stroke.  Dizziness. EXAM: CT HEAD WITHOUT CONTRAST TECHNIQUE: Contiguous axial images were obtained from the base of the skull through the vertex without intravenous contrast. RADIATION DOSE REDUCTION: This exam was performed according to the departmental dose-optimization program which includes  automated exposure control, adjustment of the mA and/or kV according to patient size and/or use of iterative reconstruction technique. COMPARISON:  None Available. FINDINGS: Brain: No evidence of acute infarction, hemorrhage, hydrocephalus, extra-axial collection or mass lesion/mass effect. Vascular: No disproportionately hyperdense vessel is identified. Skull: Normal. Negative for fracture or focal lesion. Sinuses/Orbits: No acute finding. Other: None. ASPECTS [facility] Stroke Program Early CT Score): 10 IMPRESSION: No hemorrhage or CT evidence of an acute infarct. Aspects is 10. Discussed with  Dr. Tyrone Nine on 07/27/22 at 10:44 AM via telephone Electronically Signed   By: Marin Roberts M.D.   On: 07/27/2022 10:49    Procedures Procedures    Medications Ordered in ED Medications  aspirin chewable tablet 324 mg (has no administration in time range)    ED Course/ Medical Decision Making/ A&P                           Medical Decision Making Amount and/or Complexity of Data Reviewed Labs: ordered. Radiology: ordered.  Risk OTC drugs.   66 yo F with a chief complaints of left lower extremity weakness instability with gait started about 730 this morning.  Has gotten a little bit better but still has weakness on my exam.  Will activate as a code stroke.  Patient was evaluated by teleneurology.  And thought to not be a candidate for TNK.  Thought to not be LVO.  Recommended medical admission.  MRI.  Stroke evaluation.  CT of the head here independently turbid by me negative for acute intracranial pathology.  No anemia no significant electrolyte abnormality.  With her indigestion a troponin was ordered and is negative.  The patients results and plan were reviewed and discussed.   Any x-rays performed were independently reviewed by myself.   Differential diagnosis were considered with the presenting HPI.  Medications  aspirin chewable tablet 324 mg (has no administration in time range)     Vitals:   07/27/22 1017 07/27/22 1045 07/27/22 1100  BP: (!) 165/73 (!) 166/85 (!) 160/75  Pulse: 64 60 60  Resp: '17 17 14  '$ Temp: 98.3 F (36.8 C)  98.3 F (36.8 C)  TempSrc: Oral    SpO2: 100% 100% 99%  Weight: 93.4 kg    Height: '5\' 6"'$  (1.676 m)      Final diagnoses:  Focal neurological deficit    Admission/ observation were discussed with the admitting physician, patient and/or family and they are comfortable with the plan.          Final Clinical Impression(s) / ED Diagnoses Final diagnoses:  Focal neurological deficit    Rx / DC Orders ED Discharge Orders     None         Deno Etienne, DO 07/27/22 1131

## 2022-07-27 NOTE — ED Notes (Signed)
Pt provided with tissues and tv remote. Family member at bedside. Pt aware that she will be transferred to [facility] and will be updated with times as we get them. Call bell in reach. Bed in lowest locked position. No other needs requested at this time.

## 2022-07-27 NOTE — Assessment & Plan Note (Signed)
Continue atorvastatin

## 2022-07-27 NOTE — Plan of Care (Signed)
Transfer from [facility] with left leg weakness.  CT scan of the head noted a acute abnormality.  Patient out of the window for tPA.  Teleneurology consulted and felt not likely a LVO.  Labs are unremarkable.  Recommend admitting for completion of stroke workup.  Orders placed for observation to a telemetry bed.

## 2022-07-27 NOTE — Consult Note (Signed)
NEUROLOGY TELECONSULTATION NOTE   Date of service: July 27, 2022 Patient Name: Tracey Buchanan MRN:  [id number] DOB:  1956-08-05 Reason for consult: telestroke  Requesting Provider: Dr. Deno Etienne Consult Participants: myself, bedside RN, telestroke RN, patient Location of the provider: [facility] Location of the patient: [facility]  This consult was provided via telemedicine with 2-way video and audio communication. The patient/family was informed that care would be provided in this way and agreed to receive care in this manner.   _ _ _   _ __   _ __ _ _  __ __   _ __   __ _  History of Present Illness   66 yo woman with hx DVT, HTN who presents with L sided weakness and numbness since 1am this AM. CT head NAICP. TNK not administered 2/2 presentation outside the window. No objective weakness on exam, patient's persistent sx include L sided sensory deficit and nausea with lightheadedness. NIHSS = 2. CTA not performed 2/2 exam not c/w LVO.  All CNS imaging personally reviewed   ROS   Per HPI; all other systems reviewed and are negative  Past History   The following was personally reviewed:  Past Medical History:  Diagnosis Date   Anxiety    ASCUS (atypical squamous cells of undetermined significance) on Pap smear 11/2011   negative high risk HPV   DVT (deep venous thrombosis) ([place])    on birth control pills as a teenager   Hydrosalpinx    Hypertension    Osteopenia 09/2017   T score -2.0 FRAX 4.5% / 0.9%   Urinary incontinence    VAIN I (vaginal intraepithelial neoplasia grade I) 11/2010   negative colposcopy   Vitamin D deficiency 06/2015   value 13 recommended 50,000 units weekly 12 with recheck of vitamin D level   Past Surgical History:  Procedure Laterality Date   ABDOMINAL HYSTERECTOMY  2001   Leiomyomata   CARDIAC CATHETERIZATION     MYOMECTOMY ABDOMINAL APPROACH  1996   PELVIC LAPAROSCOPY     Family History  Problem Relation Age of Onset   Diabetes Mother     Heart disease Mother    Diabetes Father    Hypertension Father    Diabetes Brother    Hypertension Brother    Sarcoidosis Brother    Sarcoidosis Brother    Diabetes Brother    Social History   Socioeconomic History   Marital status: Single    Spouse name: Not on file   Number of children: Not on file   Years of education: Not on file   Highest education level: Not on file  Occupational History   Not on file  Tobacco Use   Smoking status: Every Day    Packs/day: 0.25    Types: Cigarettes   Smokeless tobacco: Never  Vaping Use   Vaping Use: Never used  Substance and Sexual Activity   Alcohol use: Yes    Alcohol/week: 7.0 standard drinks of alcohol    Types: 7 Glasses of wine per week    Comment: takes 2 shots every other day   Drug use: No   Sexual activity: Not Currently    Birth control/protection: Surgical    Comment: 1st intercourse 19 yo-5 partners  Other Topics Concern   Not on file  Social History Narrative   Not on file   Social Determinants of Health   Financial Resource Strain: Not on file  Food Insecurity: Not on file  Transportation Needs: Not on  file  Physical Activity: Not on file  Stress: Not on file  Social Connections: Not on file   Allergies  Allergen Reactions   Cymbalta [Duloxetine Hcl] Nausea Only   Effexor [Venlafaxine]     nightmares   Flexeril [Cyclobenzaprine]     Urinary frequency   Prevacid [Lansoprazole]     Lack of therapeutic effect   Prilosec [Omeprazole]     Lack of therapeutic effect   Wellbutrin Xl [Bupropion]     nightmares   Codeine Palpitations    Medications   (Not in a hospital admission)    No current facility-administered medications for this encounter.  Current Outpatient Medications:    amLODipine (NORVASC) 10 MG tablet, , Disp: , Rfl:    atorvastatin (LIPITOR) 10 MG tablet, Take 10 mg by mouth daily., Disp: , Rfl:    FLUoxetine (PROZAC) 10 MG capsule, Take 3 capsules (30 mg total) by mouth daily.  (Patient not taking: Reported on 05/25/2022), Disp: 90 capsule, Rfl: 0   FLUoxetine (PROZAC) 20 MG capsule, Take 1 capsule (20 mg total) by mouth daily., Disp: 30 capsule, Rfl: 12   ibuprofen (ADVIL,MOTRIN) 800 MG tablet, Take 1 tablet (800 mg total) by mouth every 8 (eight) hours as needed., Disp: 30 tablet, Rfl: 1   LORazepam (ATIVAN) 0.5 MG tablet, Take 1 tablet (0.5 mg total) by mouth every 8 (eight) hours. For anxiety (Patient taking differently: Take 0.5 mg by mouth every 4 (four) hours as needed for anxiety. For anxiety), Disp: 30 tablet, Rfl: 2   metoprolol tartrate (LOPRESSOR) 100 MG tablet, Take 1 tablet (100 mg total) by mouth once for 1 dose. Take 2 hour prior to procedure., Disp: 1 tablet, Rfl: 0   olmesartan-hydrochlorothiazide (BENICAR HCT) 40-25 MG per tablet, Take 1 tablet by mouth daily.  , Disp: , Rfl:    traMADol (ULTRAM) 50 MG tablet, Take 1 tablet (50 mg total) by mouth every 6 (six) hours as needed. 1-2 tablets every 6 hours as needed for pain, Disp: 20 tablet, Rfl: 0  Vitals   Vitals:   07/27/22 1017 07/27/22 1045 07/27/22 1100 07/27/22 1130  BP: (!) 165/73 (!) 166/85 (!) 160/75 (!) 117/104  Pulse: 64 60 60 (!) 58  Resp: '17 17 14 14  '$ Temp: 98.3 F (36.8 C)  98.3 F (36.8 C)   TempSrc: Oral     SpO2: 100% 100% 99% 100%  Weight: 93.4 kg     Height: '5\' 6"'$  (1.676 m)        Body mass index is 33.25 kg/m.  Physical Exam   Exam performed over telemedicine with 2-way video and audio communication and with assistance of bedside RN  Physical Exam Gen: A&O x4, NAD Resp: normal WOB CV: extremities appear well-perfused  Neuro: *MS: A&O x4. Follows multi-step commands.  *Speech: nondysarthric, no aphasia, able to name and repeat *CN: PERRL 25m, EOMI, VFF by confrontation, sensation impaired to L face, smile symmetric, hearing intact to voice *Motor:   Normal bulk.  No tremor, rigidity or bradykinesia. No pronator drift. All extremities appear full-strength and  symmetric. *Sensory: Sensation impaired LUE and LLE. No double-simultaneous extinction.  *Coordination:  Finger-to-nose, heel-to-shin, rapid alternating motions were intact. *Reflexes:  UTA 2/2 tele-exam *Gait: deferred  NIHSS = 1 for sensory deficit  Premorbid mRS = 0   Labs   CBC:  Recent Labs  Lab 07/27/22 1031  WBC 7.5  NEUTROABS 4.5  HGB 13.8  HCT 42.7  MCV 82.9  PLT 279  Basic Metabolic Panel:  Lab Results  Component Value Date   NA 141 07/27/2022   K 3.6 07/27/2022   CO2 26 07/27/2022   GLUCOSE 116 (H) 07/27/2022   BUN 12 07/27/2022   CREATININE 0.92 07/27/2022   CALCIUM 9.1 07/27/2022   GFRNONAA >60 07/27/2022   GFRAA >60 02/11/2018   Lipid Panel:  Lab Results  Component Value Date   LDLCALC 148 (H) 11/25/2011   HgbA1c: No results found for: "HGBA1C" Urine Drug Screen:     Component Value Date/Time   LABOPIA NONE DETECTED 07/27/2022 1120   COCAINSCRNUR NONE DETECTED 07/27/2022 1120   LABBENZ NONE DETECTED 07/27/2022 1120   AMPHETMU NONE DETECTED 07/27/2022 1120   THCU POSITIVE (A) 07/27/2022 1120   LABBARB NONE DETECTED 07/27/2022 1120    Alcohol Level     Component Value Date/Time   ETH <10 07/27/2022 1031     Impression   66 yo woman with hx DVT, HTN who presents with L sided weakness and numbness since 1am this AM c/f acute ischemic stroke. CT head NAICP. TNK not administered 2/2 presentation outside the window. No objective weakness on exam, patient's persistent sx include L sided sensory deficit and nausea with lightheadedness. NIHSS = 2. CTA not performed 2/2 exam not c/w LVO.  Recommendations   - Admit to [facility] hospitalist service for stroke workup. Notify neuro on arrival and they will follow - Permissive HTN x48 hrs from sx onset or until stroke ruled out by MRI goal BP <220/110. PRN labetalol or hydralazine if BP above these parameters. Avoid oral antihypertensives. - MRI brain wo contrast - CTA/MRA if not already obtained -  TTE w/ bubble - Check A1c and LDL + add statin per guidelines - ASA '81mg'$  daily + plavix '75mg'$  daily x21 days f/b ASA '81mg'$  daily monotherapy after that - q4 hr neuro checks - STAT head CT for any change in neuro exam - Tele - PT/OT/SLP - Stroke education - Amb referral to neurology upon discharge   ______________________________________________________________________   Thank you for the opportunity to take part in the care of this patient. If you have any further questions, please contact the neurology consultation attending.  Signed,  Su Monks, MD [facility] [phone]  If 7pm- 7am, please page neurology on call as listed in [place].  **Any copied and pasted documentation in this note was written by me in another application not billed for and pasted by me into this document.

## 2022-07-27 NOTE — ED Notes (Signed)
Report given to CL.

## 2022-07-27 NOTE — H&P (Signed)
History and Physical    Tracey Buchanan DGL:[id number] DOB: [birth year] DOA: 07/27/2022  PCP: Donald Prose, MD  Patient coming from: Home  I have personally briefly reviewed patient's old medical records in [place]  Chief Complaint: Left-sided weakness  HPI: Tracey Buchanan is a 66 y.o. female with medical history significant for HTN, HLD who presented to the ED for evaluation of left-sided weakness.  Patient states around 7:30 AM 07/27/2022 she was packing her car to visit family.  When she was walking back into her house she felt off balance and unable to walk steady.  She got to her bed with the use of her mother's walker.  While in her bed she noticed new weakness in her left leg, unable to lift it off the bed.  She denied any significant other focal weakness, numbness/tingling, chest pain, dyspnea, change in vision.  [place] ED Course  Labs/Imaging on admission: I have personally reviewed following labs and imaging studies.  Initial vitals showed BP 165/73, pulse 63, RR 17, temp 98.3 F, SpO2 100% on room air.  Labs show WBC 7.5, hemoglobin 13.8, platelets 279,000, sodium 141, potassium 3.6, bicarb 26, BUN 12, creatinine 0.92, serum glucose 116, LFTs within normal limits, troponin negative x 2.  UDS positive for THC.  UA negative for UTI.  CT head without contrast negative for hemorrhage or acute infarct.  Teleneurology consulted and recommended admission for stroke workup.  TNK not administered secondary to presentation outside the window.  Patient was given aspirin 324 mg and Plavix 75 mg.  The hospitalist service was consulted to admit for further evaluation and management.  Review of Systems: All systems reviewed and are negative except as documented in history of present illness above.   Past Medical History:  Diagnosis Date   Anxiety    ASCUS (atypical squamous cells of undetermined significance) on Pap smear 11/2011   negative high risk HPV   DVT  (deep venous thrombosis) ([place])    on birth control pills as a teenager   Hydrosalpinx    Hypertension    Osteopenia 09/2017   T score -2.0 FRAX 4.5% / 0.9%   Urinary incontinence    VAIN I (vaginal intraepithelial neoplasia grade I) 11/2010   negative colposcopy   Vitamin D deficiency 06/2015   value 13 recommended 50,000 units weekly 12 with recheck of vitamin D level    Past Surgical History:  Procedure Laterality Date   ABDOMINAL HYSTERECTOMY  2001   Leiomyomata   CARDIAC CATHETERIZATION     MYOMECTOMY ABDOMINAL APPROACH  1996   PELVIC LAPAROSCOPY      Social History:  reports that she has been smoking cigarettes. She has been smoking an average of .25 packs per day. She has never used smokeless tobacco. She reports current alcohol use of about 7.0 standard drinks of alcohol per week. She reports that she does not use drugs.  Allergies  Allergen Reactions   Cymbalta [Duloxetine Hcl] Nausea Only   Effexor [Venlafaxine] Other (See Comments)    Nightmares    Flexeril [Cyclobenzaprine] Other (See Comments)    Urinary frequency   Prevacid [Lansoprazole] Other (See Comments)    Lack of therapeutic effect   Prilosec [Omeprazole] Other (See Comments)    Lack of therapeutic effect   Wellbutrin Xl [Bupropion] Other (See Comments)    Nightmares    Codeine Palpitations    Family History  Problem Relation Age of Onset   Diabetes Mother    Heart  disease Mother    Diabetes Father    Hypertension Father    Diabetes Brother    Hypertension Brother    Sarcoidosis Brother    Sarcoidosis Brother    Diabetes Brother      Prior to Admission medications   Medication Sig Start Date End Date Taking? Authorizing Provider  amLODipine (NORVASC) 10 MG tablet  04/03/22   [provider]  atorvastatin (LIPITOR) 10 MG tablet Take 10 mg by mouth daily.    [provider]  FLUoxetine (PROZAC) 10 MG capsule Take 3 capsules (30 mg total) by mouth daily. Patient not  taking: Reported on 05/25/2022 03/05/21   Nunzio Cobbs, MD  FLUoxetine (PROZAC) 20 MG capsule Take 1 capsule (20 mg total) by mouth daily. 08/10/18   Fontaine, Belinda Block, MD  ibuprofen (ADVIL,MOTRIN) 800 MG tablet Take 1 tablet (800 mg total) by mouth every 8 (eight) hours as needed. 05/02/16   Fontaine, Belinda Block, MD  LORazepam (ATIVAN) 0.5 MG tablet Take 1 tablet (0.5 mg total) by mouth every 8 (eight) hours. For anxiety Patient taking differently: Take 0.5 mg by mouth every 4 (four) hours as needed for anxiety. For anxiety 04/17/13   Fontaine, Belinda Block, MD  metoprolol tartrate (LOPRESSOR) 100 MG tablet Take 1 tablet (100 mg total) by mouth once for 1 dose. Take 2 hour prior to procedure. 06/28/22 06/28/22  Lorretta Harp, MD  olmesartan-hydrochlorothiazide (BENICAR HCT) 40-25 MG per tablet Take 1 tablet by mouth daily.      [provider]  traMADol (ULTRAM) 50 MG tablet Take 1 tablet (50 mg total) by mouth every 6 (six) hours as needed. 1-2 tablets every 6 hours as needed for pain 12/26/21   Charlesetta Shanks, MD    Physical Exam: Vitals:   07/27/22 1415 07/27/22 1500 07/27/22 1541 07/27/22 1821  BP: (!) 174/79 (!) 153/91  (!) 136/107  Pulse: 62 (!) 57  (!) 56  Resp: '20 20  17  '$ Temp:   98.1 F (36.7 C) (!) 97.5 F (36.4 C)  TempSrc:   Oral Oral  SpO2: 100% 98%  100%  Weight:      Height:       Constitutional: Resting in bed, NAD, calm, comfortable Eyes: EOMI, lids and conjunctivae normal ENMT: Mucous membranes are moist. Posterior pharynx clear of any exudate or lesions.Normal dentition.  Neck: normal, supple, no masses. Respiratory: clear to auscultation bilaterally, no wheezing, no crackles. Normal respiratory effort. No accessory muscle use.  Cardiovascular: Regular rate and rhythm, no murmurs / rubs / gallops. No extremity edema. 2+ pedal pulses. Abdomen: no tenderness, no masses palpated. Musculoskeletal: no clubbing / cyanosis. No joint deformity upper and  lower extremities. Good ROM, no contractures. Normal muscle tone.  Skin: no rashes, lesions, ulcers. No induration Neurologic: CN 2-12 grossly intact. Sensation intact. Strength 5/5 in all 4.  Psychiatric: Normal judgment and insight. Alert and oriented x 3. Normal mood.   EKG: Personally reviewed. Sinus rhythm, early R wave transition, no acute ischemic changes.  Assessment/Plan Principal Problem:   Acute left-sided weakness Active Problems:   Hypertension   Hyperlipidemia   Tracey Buchanan is a 66 y.o. female with medical history significant for HTN, HLD who presented with transient left lower extremity weakness and admitted for CVA workup.  Assessment and Plan: * Acute left-sided weakness Presenting with transient LLE weakness.  Seen by teleneurology who recommended CVA workup.  Initial CT head negative for acute changes. -MRI brain without  contrast -CTA head/neck -Echocardiogram -Aspirin 81 mg daily and Plavix 75 mg daily -Allow permissive hypertension for now -Check A1c, lipid panel -PT/OT/SLP eval -Keep on telemetry, continue neurochecks -Resume home atorvastatin 10 mg daily  Hypertension Hold home meds to allow permissive hypertension for now.  Hyperlipidemia Continue atorvastatin.  DVT prophylaxis: enoxaparin (LOVENOX) injection 40 mg Start: 07/27/22 2030 Code Status: Full code, confirmed with patient on admission Family Communication: Discussed with patient, she has discussed with family Disposition Plan: From home and likely discharge to home pending clinical progress Consults called: Neurology Severity of Illness: The appropriate patient status for this patient is OBSERVATION. Observation status is judged to be reasonable and necessary in order to provide the required intensity of service to ensure the patient's safety. The patient's presenting symptoms, physical exam findings, and initial radiographic and laboratory data in the context of their medical condition  is felt to place them at decreased risk for further clinical deterioration. Furthermore, it is anticipated that the patient will be medically stable for discharge from the hospital within 2 midnights of admission.   Zada Finders MD [facility]  If 7PM-7AM, please contact night-coverage [email or web address]  07/27/2022, 7:54 PM

## 2022-07-27 NOTE — ED Notes (Signed)
Teleneuro activated, Environmental consultant on video.

## 2022-07-28 ENCOUNTER — Observation Stay (HOSPITAL_BASED_OUTPATIENT_CLINIC_OR_DEPARTMENT_OTHER): Payer: Medicare HMO

## 2022-07-28 DIAGNOSIS — G459 Transient cerebral ischemic attack, unspecified: Secondary | ICD-10-CM | POA: Insufficient documentation

## 2022-07-28 DIAGNOSIS — R531 Weakness: Secondary | ICD-10-CM | POA: Diagnosis not present

## 2022-07-28 LAB — ECHOCARDIOGRAM COMPLETE
AV Mean grad: 5 mmHg
AV Peak grad: 9.1 mmHg
Ao pk vel: 1.51 m/s
Area-P 1/2: 2.37 cm2
Height: 66 in
S' Lateral: 2.1 cm
Weight: 3296 oz

## 2022-07-28 LAB — LIPID PANEL
Cholesterol: 201 mg/dL — ABNORMAL HIGH (ref 0–200)
HDL: 45 mg/dL (ref >40)
LDL Cholesterol: 113 mg/dL — ABNORMAL HIGH (ref 0–99)
Total CHOL/HDL Ratio: 4.5 RATIO
Triglycerides: 215 mg/dL — ABNORMAL HIGH (ref <150)
VLDL: 43 mg/dL — ABNORMAL HIGH (ref 0–40)

## 2022-07-28 LAB — HIV ANTIBODY (ROUTINE TESTING W REFLEX): HIV Screen 4th Generation wRfx: NONREACTIVE

## 2022-07-28 LAB — TROPONIN I (HIGH SENSITIVITY)
Troponin I (High Sensitivity): 4 ng/L (ref <18)
Troponin I (High Sensitivity): 4 ng/L (ref <18)

## 2022-07-28 MED ORDER — CLOPIDOGREL BISULFATE 75 MG PO TABS: 75.0000 mg | ORAL_TABLET | Freq: Every day | ORAL | 0 refills | Status: AC

## 2022-07-28 MED ORDER — OMEPRAZOLE 40 MG PO CPDR: 40.0000 mg | DELAYED_RELEASE_CAPSULE | Freq: Every day | ORAL | 0 refills | Status: AC

## 2022-07-28 MED ORDER — ATORVASTATIN CALCIUM 40 MG PO TABS: 40.0000 mg | ORAL_TABLET | Freq: Every day | ORAL | 0 refills | Status: DC

## 2022-07-28 MED ORDER — ASPIRIN 81 MG PO TBEC: 81.0000 mg | DELAYED_RELEASE_TABLET | Freq: Every day | ORAL | 12 refills | Status: AC

## 2022-07-28 MED ORDER — PANTOPRAZOLE SODIUM 40 MG PO TBEC
40.0000 mg | DELAYED_RELEASE_TABLET | Freq: Every day | ORAL | Status: DC
  Filled 2022-07-28: qty 1

## 2022-07-28 NOTE — Progress Notes (Incomplete)
Echocardiogram 2D Echocardiogram has been performed.  Ronny Flurry 07/28/2022, 10:01 AM

## 2022-07-28 NOTE — Consult Note (Addendum)
STROKE TEAM CONSULT NOTE   HPI Tracey Buchanan is a 66 y.o. female with a past medical history of DVT and HTN who presented to [facility] with left sided weakness and numbness. Patient states around 7:30 AM 07/27/2022 she was packing her car to visit family.  When she was walking back into her house she felt off balance and unable to walk steady.  She got to her bed with the use of her mother's walker.  While in her bed she noticed new weakness in her left leg, unable to lift it off the bed.  She states that she has been under increased stress recently and does report previous syncopal episodes. 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 She denies focal weakness at this time, but does feel generally weak. She does still endorse diminished sensation as well. She does not take an aspirin '81mg'$  daily.   She also complains of chest pain.  She had twelve-lead EKG yesterday was negative.  She appears a great deal of anxiety she.  She lives alone with a dog who died recently.  Orthostatic vitals from OT sitting EOB - BP: 123/64 (83); HR: 67bpm standing - BP: 115/76 (89); HR: 75bpm after ambulation - BP: 124/62 (79); HR: 61bpm  Vitals:   07/27/22 1821 07/27/22 2045 07/28/22 0018 07/28/22 0748  BP: (!) 136/107 139/69  (!) 116/58  Pulse: (!) 56 69 62 (!) 58  Resp: '17 18 18 20  '$ Temp: (!) 97.5 F (36.4 C) 98.1 F (36.7 C) 98.4 F (36.9 C) 98.3 F (36.8 C)  TempSrc: Oral Oral  Oral  SpO2: 100% 98% 96% 98%  Weight:      Height:       CBC:  Recent Labs  Lab 07/27/22 1031  WBC 7.5  NEUTROABS 4.5  HGB 13.8  HCT 42.7  MCV 82.9  PLT 829   Basic Metabolic Panel:  Recent Labs  Lab 07/27/22 1031  NA 141  K 3.6  CL 108  CO2 26  GLUCOSE 116*  BUN 12  CREATININE 0.92  CALCIUM 9.1   Lipid Panel:  Recent Labs  Lab 07/28/22 0243  CHOL 201*  TRIG 215*  HDL 45  CHOLHDL 4.5  VLDL 43*  LDLCALC 113*   HgbA1c: No results for input(s):  "HGBA1C" in the last 168 hours. Urine Drug Screen:  Recent Labs  Lab 07/27/22 1120  LABOPIA NONE DETECTED  COCAINSCRNUR NONE DETECTED  LABBENZ NONE DETECTED  AMPHETMU NONE DETECTED  THCU POSITIVE*  LABBARB NONE DETECTED    Alcohol Level  Recent Labs  Lab 07/27/22 1031  ETH <10    IMAGING past 24 hours CT ANGIO HEAD NECK W WO CM  Result Date: 07/27/2022 CLINICAL DATA:  Left-sided weakness EXAM: CT ANGIOGRAPHY HEAD AND NECK TECHNIQUE: Multidetector CT imaging of the head and neck was performed using the standard protocol during bolus administration of intravenous contrast. Multiplanar CT image reconstructions and MIPs were obtained to evaluate the vascular anatomy. Carotid stenosis measurements (when applicable) are obtained utilizing NASCET criteria, using the distal internal carotid diameter as the denominator. RADIATION DOSE REDUCTION: This exam was performed according to the departmental dose-optimization program which includes automated exposure control, adjustment of the mA and/or kV according to patient size and/or use of iterative reconstruction technique. CONTRAST:  68m OMNIPAQUE IOHEXOL 350 MG/ML SOLN COMPARISON:  No prior CTA, correlation is made with CT head 07/27/2022 and MRI head 07/27/2022 FINDINGS:  CT HEAD FINDINGS For noncontrast findings, please see same day CT head. CTA NECK FINDINGS Aortic arch: Standard branching. Imaged portion shows no evidence of aneurysm or dissection. No significant stenosis of the major arch vessel origins. Right carotid system: No evidence of stenosis, dissection, or occlusion. Left carotid system: No evidence of stenosis, dissection, or occlusion. Vertebral arteries: No evidence of stenosis, dissection, or occlusion. Skeleton: No acute osseous abnormality. Other neck: Negative. Upper chest: Emphysema.  No pleural effusion. Review of the MIP images confirms the above findings CTA HEAD FINDINGS Anterior circulation: Both internal carotid arteries are  patent to the termini, without significant stenosis. A1 segments patent. Normal anterior communicating artery. Anterior cerebral arteries are patent to their distal aspects. No M1 stenosis or occlusion. MCA branches perfused and symmetric. Posterior circulation: Vertebral arteries patent to the vertebrobasilar junction without stenosis. Posterior inferior cerebellar arteries patent proximally. Basilar patent to its distal aspect. Superior cerebellar arteries patent proximally. Patent P1 segments. Mild narrowing in proximal right P 2 (series 7, image [id number]). PCAs otherwise perfused to their distal aspects without stenosis. The right posterior communicating artery is patent. Venous sinuses: As permitted by contrast timing, patent. Anatomic variants: None significant. Review of the MIP images confirms the above findings IMPRESSION: 1. No intracranial large vessel occlusion or significant stenosis. Mild narrowing in the proximal right P2. 2. No hemodynamically significant stenosis in the neck. 3. Emphysema. Emphysema (ICD10-J43.9). Electronically Signed   By: Merilyn Baba M.D.   On: 07/27/2022 22:50   MR BRAIN WO CONTRAST  Result Date: 07/27/2022 CLINICAL DATA:  Left-sided weakness, hypertension EXAM: MRI HEAD WITHOUT CONTRAST TECHNIQUE: Multiplanar, multiecho pulse sequences of the brain and surrounding structures were obtained without intravenous contrast. COMPARISON:  No prior MRI, correlation is made with CT head 07/27/2022 FINDINGS: Brain: No restricted diffusion to suggest acute or subacute infarct. No acute hemorrhage, mass, mass effect, or midline shift. No hydrocephalus or extra-axial collection. No hemosiderin deposition to suggest remote hemorrhage. Scattered T2 hyperintense signal in the periventricular white matter, likely the sequela of minimal chronic small vessel ischemic disease. Vascular: Normal arterial flow voids. Skull and upper cervical spine: Normal marrow signal. Sinuses/Orbits: Clear  paranasal sinuses. The orbits are unremarkable. Other: The mastoids are well aerated. IMPRESSION: No acute intracranial process. No evidence of acute or subacute infarct. Electronically Signed   By: Merilyn Baba M.D.   On: 07/27/2022 21:53   CT HEAD CODE STROKE WO CONTRAST  Result Date: 07/27/2022 CLINICAL DATA:  Code stroke.  Dizziness. EXAM: CT HEAD WITHOUT CONTRAST TECHNIQUE: Contiguous axial images were obtained from the base of the skull through the vertex without intravenous contrast. RADIATION DOSE REDUCTION: This exam was performed according to the departmental dose-optimization program which includes automated exposure control, adjustment of the mA and/or kV according to patient size and/or use of iterative reconstruction technique. COMPARISON:  None Available. FINDINGS: Brain: No evidence of acute infarction, hemorrhage, hydrocephalus, extra-axial collection or mass lesion/mass effect. Vascular: No disproportionately hyperdense vessel is identified. Skull: Normal. Negative for fracture or focal lesion. Sinuses/Orbits: No acute finding. Other: None. ASPECTS [facility] Stroke Program Early CT Score): 10 IMPRESSION: No hemorrhage or CT evidence of an acute infarct. Aspects is 10. Discussed with Dr. Tyrone Nine on 07/27/22 at 10:44 AM via telephone Electronically Signed   By: Marin Roberts M.D.   On: 07/27/2022 10:49    PHYSICAL EXAM  Physical Exam  Constitutional: Appears well-developed and well-nourished.   Cardiovascular: Normal rate and regular rhythm.  Respiratory: Effort normal, non-labored breathing  Neuro: Mental Status: Patient is awake, alert, oriented to person, place, month, year, and situation. Patient is able to give a clear and coherent history. No signs of aphasia or neglect She does appear to be anxious Cranial Nerves: II: Visual Fields are full. Pupils are equal, round, and reactive to light.   III,IV, VI: EOMI without ptosis or diploplia.  V: Facial sensation is symmetric to  temperature VII: Facial movement is symmetric resting and smiling VIII: Hearing is intact to voice X: Palate elevates symmetrically XI: Shoulder shrug is symmetric. XII: Tongue protrudes midline without atrophy or fasciculations.  Motor: Tone is normal. Bulk is normal. 5/5 strength was present in all four extremities.  Sensory: Sensation is subjectively diminished to light touch on the left side. Does not split midline on the sternum or forehead.  Cerebellar: FNF and HKS are intact bilaterally    ASSESSMENT/PLAN Ms. ASHANTA AMOROSO is a 66 y.o. female with history of HTN, HLD who presented to the ED for evaluation of left-sided weakness.   TIA vs syncopal episode vs acute stress reaction Code Stroke CT head No acute abnormality.  CTA head & neck No intracranial large vessel occlusion or significant stenosis. Mild narrowing in the proximal right P2. MRI  No acute abnormality 2D Echo EF 60-65% LDL 113 HgbA1c No results found for requested labs within last 1095 days. VTE prophylaxis - Lovenox    Diet   Diet Heart Room service appropriate? Yes; Fluid consistency: Thin   No antithrombotic prior to admission, now on aspirin 81 mg daily and clopidogrel 75 mg daily for 3 weeks and then aspirin alone Therapy recommendations:  Outpatient OT/PT Disposition:  Home  Hypertension Home meds:  amlodipine Stable Permissive hypertension (OK if < 220/120) but gradually normalize in 5-7 days Long-term BP goal normotensive  Hyperlipidemia Home meds:  atorvastatin '10mg'$ , resumed in hospital LDL 113, goal < 70 Continue statin at discharge   Other Stroke Risk Factors Advanced Age >/= 65  ETOH use, alcohol level <10, advised to drink no more than 1 drink(s) a day Substance abuse - UDS:  THC POSITIVE, Cocaine NONE DETECTED. Patient advised to stop using due to stroke risk. Obesity, Body mass index is 33.25 kg/m., BMI >/= 30 associated with increased stroke risk, recommend weight loss, diet  and exercise as appropriate  \  Chest pain- Follows with [facility] Dr Gwenlyn Found Cardiac CT ordered outpatient for 08/03/2022 Follow up with outpatient cardiology as recommended  Hospital day # 0  Patient seen and examined by NP/APP with MD. MD to update note as needed.   Janine Ores, DNP, FNP-BC [facility] Pager: [phone]  ATTENDING ATTESTATION:  66 year old female with left-sided weakness concerning for possible stroke however MRI is negative.  Recommend DAPT therapy X 3 weeks then aspirin alone.  She has a great deal of anxiety and depression.  She complains of chest pain but EKG is negative.  Echo is unremarkable.  Goal LDL is less than 70.  PT OT recommending home health.  Patient's questions were answered.  Counseling provided for anxiety.  She should follow-up with a cardiologist plan for outpatient coronary calcium CT scan.  She can follow-up in stroke clinic after discharge.  Neurology will sign off please call with questions.  Dr. Reeves Forth evaluated pt independently, reviewed imaging, chart, labs. Discussed and formulated plan with the Resident/APP. Changes were made to the note where appropriate. Please see APP/resident note above for details.    Eliyana Pagliaro,MD    To contact Stroke  Continuity provider, please refer to [email or web address] After hours, contact General Neurology

## 2022-07-28 NOTE — Discharge Summary (Signed)
Physician Discharge Summary  Tracey Buchanan NKN:[id number] DOB: Oct 12, 1955 DOA: 07/27/2022  PCP: Donald Prose, MD  Admit date: 07/27/2022 Discharge date: 07/28/2022    Admitted From: Home Disposition: Home  Recommendations for Outpatient Follow-up:  Follow up with PCP in 1-2 weeks Please obtain BMP/CBC in one week Please follow up with your PCP on the following pending results: Unresulted Labs (From admission, onward)     Start     Ordered   07/28/22 0500  Hemoglobin A1c  (Labs)  Tomorrow morning,   R       Comments: To assess prior glycemic control   Question:  Specimen collection method  Answer:  Lab=Lab collect   07/27/22 Hailey: None Equipment/Devices: None  Discharge Condition: Stable CODE STATUS: Full code Diet recommendation: Cardiac  Subjective: Seen and examined this morning.  Patient was getting her echo.  She denied having any left-sided weakness recurrence since she arrived to the [facility].  However she was complaining of dizziness at that time.  She did appear to be extremely anxious and apprehensive about going home and had a lot of questions about multiple things.  She informed me that she is scheduled to have CT coronary next week and was demanding to get this done while she is in the hospital.  I explained to her that since she is here with stroke symptoms, and no cardiac complaints and CT coronary is already scheduled as outpatient so there is no indication to complete this study as inpatient especially on a holiday.  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  Couple of hours later, patient mentioned to the nurse as well as to the neurologist that she was having nausea as well as indigestion and some chest pain.  EKG  was done yesterday which was unremarkable.  Per her insistence, cardiac enzymes were checked which were unremarkable as well.  She was given PPI which relieved her symptoms.    HPI: Tracey Buchanan is a 66 y.o. female with medical history significant for HTN, HLD who presented to the ED for evaluation of left-sided weakness.   Patient states around 7:30 AM 07/27/2022 she was packing her car to visit family.  When she was walking back into her house she felt off balance and unable to walk steady.  She got to her bed with the use of her mother's walker.  While in her bed she noticed new weakness in her left leg, unable to lift it off the bed.  She denied any significant other focal weakness, numbness/tingling, chest pain, dyspnea, change in vision.   [facility] ED Course  Labs/Imaging on admission: I have personally reviewed following labs and imaging studies.   Initial vitals showed BP 165/73, pulse 63, RR 17, temp 98.3 F, SpO2 100% on room air.   Labs show WBC 7.5, hemoglobin 13.8, platelets 279,000, sodium 141, potassium 3.6, bicarb 26, BUN 12, creatinine 0.92, serum glucose 116, LFTs within normal limits, troponin negative x 2.  UDS positive for THC.  UA negative for UTI.   CT head without contrast negative for hemorrhage or acute infarct.   Teleneurology consulted and recommended admission for stroke workup.  TNK  not administered secondary to presentation outside the window.  Patient was given aspirin 324 mg and Plavix 75 mg.  The hospitalist service was consulted to admit for further evaluation and management.    Brief/Interim Summary: Patient was admitted with left-sided weakness and further workup for stroke, she had CT head followed by MRI brain which was negative for stroke.  She had CTA of the head and neck done which showed mild narrowing in the proximal right P2.  She was seen by neurology.  They started her on DAPT/aspirin plus Plavix and recommended discharging on both for  21 days and then continuing aspirin but stopping Plavix.  Her cholesterol including LDL was elevated, she was supposed to be taking atorvastatin 10 mg but she was not taking that as well.  She is being discharged on high intensity atorvastatin of 40 mg p.o. daily.  As detailed above, it is very obvious that patient is going through a great deal of anxiety which may have led to her symptoms of left-sided weakness and chest pain.  Her presumed diagnosis is either TIA or stress-induced symptoms.  Patient is stable and she is being discharged and she is agreeable for that plan.  She is requesting PPI prescription.  Discharge plan was discussed with patient and/or family member and they verbalized understanding and agreed with it.  Discharge Diagnoses:  Principal Problem:   Acute left-sided weakness Active Problems:   Hypertension   Anxiety   Hyperlipidemia   TIA (transient ischemic attack)    Discharge Instructions   Allergies as of 07/28/2022       Reactions   Cymbalta [duloxetine Hcl] Nausea Only   Effexor [venlafaxine] Other (See Comments)   Nightmares    Flexeril [cyclobenzaprine] Other (See Comments)   Urinary frequency   Prevacid [lansoprazole] Other (See Comments)   Lack of therapeutic effect   Prilosec [omeprazole] Other (See Comments)   Lack of therapeutic effect   Wellbutrin Xl [bupropion] Other (See Comments)   Nightmares    Codeine Palpitations        Medication List     TAKE these medications    amLODipine 10 MG tablet Commonly known as: NORVASC Take 10 mg by mouth daily.   aspirin EC 81 MG tablet Take 1 tablet (81 mg total) by mouth daily. Swallow whole. Start taking on: July 29, 2022   atorvastatin 40 MG tablet Commonly known as: Lipitor Take 1 tablet (40 mg total) by mouth daily. What changed:  medication strength how much to take   clopidogrel 75 MG tablet Commonly known as: PLAVIX Take 1 tablet (75 mg total) by mouth daily for 21  days. Start taking on: July 29, 2022   FLUoxetine 20 MG capsule Commonly known as: PROZAC Take 1 capsule (20 mg total) by mouth daily.   ibuprofen 200 MG tablet Commonly known as: ADVIL Take 400 mg by mouth 2 (two) times daily as needed for headache or mild pain.   LORazepam 0.5 MG tablet Commonly known as: ATIVAN Take 0.5 mg by mouth 2 (two) times daily as needed for anxiety.   metoprolol tartrate 100 MG tablet Commonly known as: LOPRESSOR Take 1 tablet (100 mg total) by mouth once for 1 dose. Take 2 hour prior to procedure.   olmesartan-hydrochlorothiazide 40-12.5 MG tablet Commonly known as: BENICAR HCT Take 1 tablet by mouth daily.   omeprazole 40 MG capsule Commonly known as: PRILOSEC Take 1 capsule (40 mg total) by mouth daily.   triamcinolone cream 0.1 % Commonly  known as: KENALOG Apply 1 Application topically 2 (two) times daily as needed (irritation).        Follow-up Information     Donald Prose, MD Follow up in 1 week(s).   Specialty: Family Medicine Contact information: [address], [address] [place] [address] [phone]                Allergies  Allergen Reactions   Cymbalta [Duloxetine Hcl] Nausea Only   Effexor [Venlafaxine] Other (See Comments)    Nightmares    Flexeril [Cyclobenzaprine] Other (See Comments)    Urinary frequency   Prevacid [Lansoprazole] Other (See Comments)    Lack of therapeutic effect   Prilosec [Omeprazole] Other (See Comments)    Lack of therapeutic effect   Wellbutrin Xl [Bupropion] Other (See Comments)    Nightmares    Codeine Palpitations    Consultations: Neurology   Procedures/Studies: ECHOCARDIOGRAM COMPLETE  Result Date: 07/28/2022    ECHOCARDIOGRAM REPORT   Patient Name:   REINA WILTON Date of Exam: 07/28/2022 Medical Rec #:  [id number]       Height:       66.0 in Accession #:    [id number]      Weight:       206.0 lb Date of Birth:  1956/03/23      BSA:          2.025 m Patient  Age:    38 years        BP:           174/79 mmHg Patient Gender: F               HR:           55 bpm. Exam Location:  Inpatient Procedure: 2D Echo, Cardiac Doppler and Color Doppler Indications:    Stroke I63.9  History:        Patient has no prior history of Echocardiogram examinations.                 Signs/Symptoms:Chest Pain; Risk Factors:Hypertension and                 Dyslipidemia. DVT (deep venous thrombosis).  Sonographer:    Ronny Flurry Referring Phys: [id number] VISHAL R PATEL  Sonographer Comments: Patient refused remainder of the exam IMPRESSIONS  1. Left ventricular ejection fraction, by estimation, is 60 to 65%. The left ventricle has normal function. The left ventricle has no regional wall motion abnormalities. There is severe asymmetric left ventricular hypertrophy of the septal segment. Left  ventricular diastolic parameters are consistent with Grade I diastolic dysfunction (impaired relaxation).  2. Right ventricular systolic function is normal. The right ventricular size is normal.  3. The mitral valve is grossly normal. No evidence of mitral valve regurgitation. No evidence of mitral stenosis.  4. The aortic valve is tricuspid. Aortic valve regurgitation is not visualized. No aortic stenosis is present.  5. The inferior vena cava is normal in size with greater than 50% respiratory variability, suggesting right atrial pressure of 3 mmHg. Comparison(s): No prior Echocardiogram. FINDINGS  Left Ventricle: Left ventricular ejection fraction, by estimation, is 60 to 65%. The left ventricle has normal function. The left ventricle has no regional wall motion abnormalities. The left ventricular internal cavity size was small. There is severe asymmetric left ventricular hypertrophy of the septal segment. Left ventricular diastolic parameters are consistent with Grade I diastolic dysfunction (impaired relaxation). Right Ventricle: The right ventricular size is normal. No increase  in right ventricular  wall thickness. Right ventricular systolic function is normal. Left Atrium: Left atrial size was normal in size. Right Atrium: Right atrial size was normal in size. Pericardium: Trivial pericardial effusion is present. Mitral Valve: The mitral valve is grossly normal. No evidence of mitral valve regurgitation. No evidence of mitral valve stenosis. Tricuspid Valve: The tricuspid valve is normal in structure. Tricuspid valve regurgitation is not demonstrated. No evidence of tricuspid stenosis. Aortic Valve: The aortic valve is tricuspid. Aortic valve regurgitation is not visualized. No aortic stenosis is present. Aortic valve mean gradient measures 5.0 mmHg. Aortic valve peak gradient measures 9.1 mmHg. Pulmonic Valve: The pulmonic valve was normal in structure. Pulmonic valve regurgitation is trivial. No evidence of pulmonic stenosis. Aorta: The aortic root and ascending aorta are structurally normal, with no evidence of dilitation. Venous: The inferior vena cava is normal in size with greater than 50% respiratory variability, suggesting right atrial pressure of 3 mmHg. IAS/Shunts: No atrial level shunt detected by color flow Doppler.  LEFT VENTRICLE PLAX 2D LVIDd:         3.60 cm   Diastology LVIDs:         2.10 cm   LV e' medial:    6.64 cm/s LV PW:         1.10 cm   LV E/e' medial:  7.2 LV IVS:        1.30 cm   LV e' lateral:   7.51 cm/s LVOT diam:     1.70 cm   LV E/e' lateral: 6.3 LVOT Area:     2.27 cm  RIGHT VENTRICLE RV S prime:     13.80 cm/s TAPSE (M-mode): 1.9 cm LEFT ATRIUM             Index        RIGHT ATRIUM           Index LA diam:        3.00 cm 1.48 cm/m   RA Area:     12.30 cm LA Vol (A2C):   38.6 ml 19.06 ml/m  RA Volume:   27.40 ml  13.53 ml/m LA Vol (A4C):   37.4 ml 18.47 ml/m LA Biplane Vol: 39.1 ml 19.31 ml/m  AORTIC VALVE AV Vmax:      151.00 cm/s AV Vmean:     102.000 cm/s AV VTI:       0.316 m AV Peak Grad: 9.1 mmHg AV Mean Grad: 5.0 mmHg  AORTA Ao Root diam: 3.10 cm Ao Asc diam:   3.00 cm MITRAL VALVE MV Area (PHT): 2.37 cm    SHUNTS MV Decel Time: 320 msec    Systemic Diam: 1.70 cm MV E velocity: 47.50 cm/s MV A velocity: 72.75 cm/s MV E/A ratio:  0.65 Rudean Haskell MD Electronically signed by Rudean Haskell MD Signature Date/Time: 07/28/2022/11:50:04 AM    Final    CT ANGIO HEAD NECK W WO CM  Result Date: 07/27/2022 CLINICAL DATA:  Left-sided weakness EXAM: CT ANGIOGRAPHY HEAD AND NECK TECHNIQUE: Multidetector CT imaging of the head and neck was performed using the standard protocol during bolus administration of intravenous contrast. Multiplanar CT image reconstructions and MIPs were obtained to evaluate the vascular anatomy. Carotid stenosis measurements (when applicable) are obtained utilizing NASCET criteria, using the distal internal carotid diameter as the denominator. RADIATION DOSE REDUCTION: This exam was performed according to the departmental dose-optimization program which includes automated exposure control, adjustment of the mA and/or kV according to patient size and/or  use of iterative reconstruction technique. CONTRAST:  44m OMNIPAQUE IOHEXOL 350 MG/ML SOLN COMPARISON:  No prior CTA, correlation is made with CT head 07/27/2022 and MRI head 07/27/2022 FINDINGS: CT HEAD FINDINGS For noncontrast findings, please see same day CT head. CTA NECK FINDINGS Aortic arch: Standard branching. Imaged portion shows no evidence of aneurysm or dissection. No significant stenosis of the major arch vessel origins. Right carotid system: No evidence of stenosis, dissection, or occlusion. Left carotid system: No evidence of stenosis, dissection, or occlusion. Vertebral arteries: No evidence of stenosis, dissection, or occlusion. Skeleton: No acute osseous abnormality. Other neck: Negative. Upper chest: Emphysema.  No pleural effusion. Review of the MIP images confirms the above findings CTA HEAD FINDINGS Anterior circulation: Both internal carotid arteries are patent to the  termini, without significant stenosis. A1 segments patent. Normal anterior communicating artery. Anterior cerebral arteries are patent to their distal aspects. No M1 stenosis or occlusion. MCA branches perfused and symmetric. Posterior circulation: Vertebral arteries patent to the vertebrobasilar junction without stenosis. Posterior inferior cerebellar arteries patent proximally. Basilar patent to its distal aspect. Superior cerebellar arteries patent proximally. Patent P1 segments. Mild narrowing in proximal right P 2 (series 7, image [id number]). PCAs otherwise perfused to their distal aspects without stenosis. The right posterior communicating artery is patent. Venous sinuses: As permitted by contrast timing, patent. Anatomic variants: None significant. Review of the MIP images confirms the above findings IMPRESSION: 1. No intracranial large vessel occlusion or significant stenosis. Mild narrowing in the proximal right P2. 2. No hemodynamically significant stenosis in the neck. 3. Emphysema. Emphysema (ICD10-J43.9). Electronically Signed   By: AMerilyn BabaM.D.   On: 07/27/2022 22:50   MR BRAIN WO CONTRAST  Result Date: 07/27/2022 CLINICAL DATA:  Left-sided weakness, hypertension EXAM: MRI HEAD WITHOUT CONTRAST TECHNIQUE: Multiplanar, multiecho pulse sequences of the brain and surrounding structures were obtained without intravenous contrast. COMPARISON:  No prior MRI, correlation is made with CT head 07/27/2022 FINDINGS: Brain: No restricted diffusion to suggest acute or subacute infarct. No acute hemorrhage, mass, mass effect, or midline shift. No hydrocephalus or extra-axial collection. No hemosiderin deposition to suggest remote hemorrhage. Scattered T2 hyperintense signal in the periventricular white matter, likely the sequela of minimal chronic small vessel ischemic disease. Vascular: Normal arterial flow voids. Skull and upper cervical spine: Normal marrow signal. Sinuses/Orbits: Clear paranasal  sinuses. The orbits are unremarkable. Other: The mastoids are well aerated. IMPRESSION: No acute intracranial process. No evidence of acute or subacute infarct. Electronically Signed   By: AMerilyn BabaM.D.   On: 07/27/2022 21:53   CT HEAD CODE STROKE WO CONTRAST  Result Date: 07/27/2022 CLINICAL DATA:  Code stroke.  Dizziness. EXAM: CT HEAD WITHOUT CONTRAST TECHNIQUE: Contiguous axial images were obtained from the base of the skull through the vertex without intravenous contrast. RADIATION DOSE REDUCTION: This exam was performed according to the departmental dose-optimization program which includes automated exposure control, adjustment of the mA and/or kV according to patient size and/or use of iterative reconstruction technique. COMPARISON:  None Available. FINDINGS: Brain: No evidence of acute infarction, hemorrhage, hydrocephalus, extra-axial collection or mass lesion/mass effect. Vascular: No disproportionately hyperdense vessel is identified. Skull: Normal. Negative for fracture or focal lesion. Sinuses/Orbits: No acute finding. Other: None. ASPECTS ([facility] Score[facility] 10 IMPRESSION: No hemorrhage or CT evidence of an acute infarct. Aspects is 10. Discussed with Dr. FTyrone Nineon 07/27/22 at 10:44 AM via telephone Electronically Signed   By: HMarin RobertsM.D.   On:  07/27/2022 10:49   MM DIAG BREAST TOMO BILATERAL  Result Date: 06/29/2022 CLINICAL DATA:  66 year old female with diffuse bilateral breast pain and non spontaneous white RIGHT nipple discharge. EXAM: DIGITAL DIAGNOSTIC BILATERAL MAMMOGRAM WITH TOMOSYNTHESIS; ULTRASOUND RIGHT BREAST LIMITED TECHNIQUE: Bilateral digital diagnostic mammography and breast tomosynthesis was performed.; Targeted ultrasound examination of the right breast was performed COMPARISON:  Previous exam(s). ACR Breast Density Category b: There are scattered areas of fibroglandular density. FINDINGS: Full field views of both breasts and a spot  compression view of the RIGHT breast demonstrate no suspicious mass, distortion or worrisome calcifications. A stable circumscribed OUTER RIGHT breast mass is unchanged for many years and considered benign. On physical exam, the patient was able to express white multi duct RIGHT nipple discharge. Targeted ultrasound is performed, showing mild duct ectasia in the RETROAREOLAR RIGHT breast identified without suspicious mass, distortion or worrisome shadowing. IMPRESSION: 1. Benign RIGHT nipple discharge. 2. No evidence of breast malignancy. RECOMMENDATION: Bilateral screening mammogram in 1 year. I have discussed the findings, causes of nipple discharge and breast pain and recommendations with the patient. If applicable, a reminder letter will be sent to the patient regarding the next appointment. BI-RADS CATEGORY  2: Benign. Electronically Signed   By: Margarette Canada M.D.   On: 06/29/2022 11:33  US BREAST LTD UNI RIGHT INC AXILLA  Result Date: 06/29/2022 CLINICAL DATA:  66 year old female with diffuse bilateral breast pain and non spontaneous white RIGHT nipple discharge. EXAM: DIGITAL DIAGNOSTIC BILATERAL MAMMOGRAM WITH TOMOSYNTHESIS; ULTRASOUND RIGHT BREAST LIMITED TECHNIQUE: Bilateral digital diagnostic mammography and breast tomosynthesis was performed.; Targeted ultrasound examination of the right breast was performed COMPARISON:  Previous exam(s). ACR Breast Density Category b: There are scattered areas of fibroglandular density. FINDINGS: Full field views of both breasts and a spot compression view of the RIGHT breast demonstrate no suspicious mass, distortion or worrisome calcifications. A stable circumscribed OUTER RIGHT breast mass is unchanged for many years and considered benign. On physical exam, the patient was able to express white multi duct RIGHT nipple discharge. Targeted ultrasound is performed, showing mild duct ectasia in the RETROAREOLAR RIGHT breast identified without suspicious mass,  distortion or worrisome shadowing. IMPRESSION: 1. Benign RIGHT nipple discharge. 2. No evidence of breast malignancy. RECOMMENDATION: Bilateral screening mammogram in 1 year. I have discussed the findings, causes of nipple discharge and breast pain and recommendations with the patient. If applicable, a reminder letter will be sent to the patient regarding the next appointment. BI-RADS CATEGORY  2: Benign. Electronically Signed   By: Margarette Canada M.D.   On: 06/29/2022 11:33    Discharge Exam: Vitals:   07/28/22 0748 07/28/22 1332  BP: (!) 116/58 136/61  Pulse: (!) 58 (!) 54  Resp: 20 20  Temp: 98.3 F (36.8 C) 98.2 F (36.8 C)  SpO2: 98% 100%   Vitals:   07/27/22 2045 07/28/22 0018 07/28/22 0748 07/28/22 1332  BP:   (!) 116/58 136/61  Pulse: 69 62 (!) 58 (!) 54  Resp: '18 18 20 20  '$ Temp: 98.1 F (36.7 C) 98.4 F (36.9 C) 98.3 F (36.8 C) 98.2 F (36.8 C)  TempSrc: Oral  Oral Oral  SpO2: 98% 96% 98% 100%  Weight:      Height:        General: Pt is alert, awake, not in acute distress Cardiovascular: RRR, S1/S2 +, no rubs, no gallops Respiratory: CTA bilaterally, no wheezing, no rhonchi Abdominal: Soft, NT, ND, bowel sounds + Extremities: no edema, no cyanosis  The results of significant diagnostics from this hospitalization (including imaging, microbiology, ancillary and laboratory) are listed below for reference.     Microbiology: No results found for this or any previous visit (from the past 240 hour(s)).   Labs: BNP (last 3 results) No results for input(s): "BNP" in the last 8760 hours. Basic Metabolic Panel: Recent Labs  Lab 07/27/22 1031  NA 141  K 3.6  CL 108  CO2 26  GLUCOSE 116*  BUN 12  CREATININE 0.92  CALCIUM 9.1   Liver Function Tests: Recent Labs  Lab 07/27/22 1031  AST 20  ALT 21  ALKPHOS 92  BILITOT 0.4  PROT 7.4  ALBUMIN 4.0   No results for input(s): "LIPASE", "AMYLASE" in the last 168 hours. No results for input(s): "AMMONIA" in  the last 168 hours. CBC: Recent Labs  Lab 07/27/22 1031  WBC 7.5  NEUTROABS 4.5  HGB 13.8  HCT 42.7  MCV 82.9  PLT 279   Cardiac Enzymes: No results for input(s): "CKTOTAL", "CKMB", "CKMBINDEX", "TROPONINI" in the last 168 hours. BNP: Invalid input(s): "POCBNP" CBG: Recent Labs  Lab 07/27/22 1015  GLUCAP 121*   D-Dimer No results for input(s): "DDIMER" in the last 72 hours. Hgb A1c No results for input(s): "HGBA1C" in the last 72 hours. Lipid Profile Recent Labs    07/28/22 0243  CHOL 201*  HDL 45  LDLCALC 113*  TRIG 215*  CHOLHDL 4.5   Thyroid function studies No results for input(s): "TSH", "T4TOTAL", "T3FREE", "THYROIDAB" in the last 72 hours.  Invalid input(s): "FREET3" Anemia work up No results for input(s): "VITAMINB12", "FOLATE", "FERRITIN", "TIBC", "IRON", "RETICCTPCT" in the last 72 hours. Urinalysis    Component Value Date/Time   COLORURINE YELLOW 07/27/2022 1120   APPEARANCEUR CLEAR 07/27/2022 1120   LABSPEC 1.020 07/27/2022 1120   PHURINE 7.0 07/27/2022 1120   GLUCOSEU NEGATIVE 07/27/2022 1120   HGBUR TRACE (A) 07/27/2022 1120   BILIRUBINUR NEGATIVE 07/27/2022 1120   KETONESUR NEGATIVE 07/27/2022 1120   PROTEINUR NEGATIVE 07/27/2022 1120   UROBILINOGEN 1 06/03/2014 1636   NITRITE NEGATIVE 07/27/2022 1120   LEUKOCYTESUR NEGATIVE 07/27/2022 1120   Sepsis Labs Recent Labs  Lab 07/27/22 1031  WBC 7.5   Microbiology No results found for this or any previous visit (from the past 240 hour(s)).   Time coordinating discharge: Over 30 minutes  SIGNED:   Darliss Cheney, MD  [facility] 07/28/2022, 5:01 PM *Please note that this is a verbal dictation therefore any spelling or grammatical errors are due to the "[employer]" system interpretation. If 7PM-7AM, please contact night-coverage [email or web address]

## 2022-07-28 NOTE — Evaluation (Signed)
Occupational Therapy Evaluation Patient Details Name: Tracey Buchanan MRN: [id number] DOB: 1955/12/08 Today's Date: 07/28/2022   History of Present Illness 66 y.o. female presenting to [place] ED on 11/22 with decreased balance, gait, and LLE weakness. MRI revealed no acute intracranial process or acute or subacute infarct. PMH significant for HTN, HLD, osteopenia, DVT, and anxiety.   Clinical Impression   PTA, pt lived alone and was independent. Pt endorses significant amount of stress and reports she cares for her 55 year old mother part time, and recently lost her dog. Upon eval, pt performing bathing at sink and toileting with supervision. Pt reporting dizziness and with frequent eye closing, but denying double or blurred vision. Pt with inconsistent reports of whether head turns provoke dizziness. Pt able to perform visual tracking with no nystagmus, but was observed with visual fatigue returning to midline 2x when tracking vertically on the left. Pt with tangential thought processes consistent with stress experience. Recommending OP OT for stress management, further vestibular evaluation, and further cognitive assessment.   BPs:  sitting EOB - BP: 123/64 (83); HR: 67bpm standing - BP: 115/76 (89); HR: 75bpm after ambulation - BP: 124/62 (79); HR: 61bpm   Recommendations for follow up therapy are one component of a multi-disciplinary discharge planning process, led by the attending physician.  Recommendations may be updated based on patient status, additional functional criteria and insurance authorization.   Follow Up Recommendations  Outpatient OT     Assistance Recommended at Discharge Set up Supervision/Assistance  Patient can return home with the following A little help with walking and/or transfers;A little help with bathing/dressing/bathroom;Assistance with cooking/housework;Assist for transportation    Functional Status Assessment  Patient has had a recent decline in  their functional status and demonstrates the ability to make significant improvements in function in a reasonable and predictable amount of time.  Equipment Recommendations  BSC/3in1    Recommendations for Other Services       Precautions / Restrictions Precautions Precautions: Fall Precaution Comments: dizziness Restrictions Weight Bearing Restrictions: No      Mobility Bed Mobility Overal bed mobility: Independent             General bed mobility comments: HOB elevated but pt not needing assist    Transfers Overall transfer level: Needs assistance Equipment used: None Transfers: Sit to/from Stand Sit to Stand: Supervision           General transfer comment: supervision for transfers for safety, no overt LOB      Balance Overall balance assessment: Needs assistance Sitting-balance support: No upper extremity supported, Feet supported Sitting balance-Leahy Scale: Good     Standing balance support: No upper extremity supported, During functional activity Standing balance-Leahy Scale: Fair Standing balance comment: can ambulate without LOB, increased sway and instability                           ADL either performed or assessed with clinical judgement   ADL Overall ADL's : Needs assistance/impaired Eating/Feeding: Modified independent;Sitting   Grooming: Supervision/safety;Standing   Upper Body Bathing: Supervision/ safety;Standing   Lower Body Bathing: Supervison/ safety;Sit to/from stand   Upper Body Dressing : Set up;Sitting   Lower Body Dressing: Supervision/safety;Sit to/from stand   Toilet Transfer: Supervision/safety;Ambulation           Functional mobility during ADLs: Supervision/safety General ADL Comments: Supervision for safety with standing activity due to frequent report of dizziness, however, pt up in room alone on  arrival.     Vision Baseline Vision/History: 1 Wears glasses Ability to See in Adequate Light: 0  Adequate Patient Visual Report: No change from baseline Vision Assessment?: Vision impaired- to be further tested in functional context;Yes Eye Alignment: Within Functional Limits Ocular Range of Motion: Within Functional Limits Tracking/Visual Pursuits: Decreased smoothness of eye movement to LEFT superior field;Decreased smoothness of eye movement to LEFT inferior field (decreased visual attention/frequently coming to midline during vertical tracking on the L) Saccades: Within functional limits Diplopia Assessment:  (denies but does frequently close eyes while up moving)     Perception Perception Perception Tested?: No   Praxis Praxis Praxis tested?: Not tested    Pertinent Vitals/Pain Pain Assessment Pain Assessment: No/denies pain     Hand Dominance Right   Extremity/Trunk Assessment Upper Extremity Assessment Upper Extremity Assessment: RUE deficits/detail;Generalized weakness RUE Deficits / Details: Pt reports that light touch feels heavy as compared to similar touch on L side.   Lower Extremity Assessment Lower Extremity Assessment: Defer to PT evaluation LLE Deficits / Details: grossly 4/5 to MMT with weakest at knee flexion. pt reports this is more painful than weak but unable to get clear answer. reports back of L calf feels "like a vein" LLE Sensation: decreased light touch (pt reports back of L calf "feels like a vein")   Cervical / Trunk Assessment Cervical / Trunk Assessment: Normal   Communication Communication Communication: No difficulties   Cognition Arousal/Alertness: Awake/alert Behavior During Therapy: Impulsive, Anxious Overall Cognitive Status: No family/caregiver present to determine baseline cognitive functioning                                 General Comments: tangential at times, able to follow all cues and instructions but did need increased explanation. seems very anxious in general about life events and responsibilties.      General Comments  BP stable with positional cahnge, but pt with consistent report of dizziness.    Exercises Exercises: Other exercises Other Exercises Other Exercises: attempted VOR x1 with pt reporting no change in sx, horixontal and vertical head turns, and visual scanning with no signs of nystagmus. Pt not fully moving head during testing and at times giving unclear answers regarding dizziness such as stating "I have to stand up to see" when asked if head movement made her more dizzy   Shoulder Instructions      Home Living Family/patient expects to be discharged to:: Private residence Living Arrangements: Alone Available Help at Discharge: Family;Friend(s);Available PRN/intermittently Type of Home: House Home Access: Level entry     Home Layout: One level     Bathroom Shower/Tub: Teacher, early years/pre: Standard     Home Equipment: Conservation officer, nature (2 wheels) (RW is her mother's)      Lives With: Alone (Mother stays for weeks at a time)    Prior Functioning/Environment Prior Level of Function : Independent/Modified Independent;Driving             Mobility Comments: pt reports no issues prior to onset of dizziness, no falls. does not use DME. ADLs Comments: pt reports significant amount of stress with family/responsibilities. reports independence with ADLs, driving, and takes care of 90yo mother when she visits        OT Problem List: Decreased strength;Decreased activity tolerance;Impaired balance (sitting and/or standing);Decreased safety awareness      OT Treatment/Interventions: Self-care/ADL training;Therapeutic exercise;DME and/or AE instruction;Cognitive remediation/compensation;Patient/family education;Balance training;Therapeutic  activities    OT Goals(Current goals can be found in the care plan section) Acute Rehab OT Goals Patient Stated Goal: go home OT Goal Formulation: With patient Time For Goal Achievement: 08/11/22 Potential to  Achieve Goals: Good  OT Frequency: Min 2X/week    Co-evaluation              AM-PAC OT "6 Clicks" Daily Activity     Outcome Measure Help from another person eating meals?: None Help from another person taking care of personal grooming?: None Help from another person toileting, which includes using toliet, bedpan, or urinal?: A Little Help from another person bathing (including washing, rinsing, drying)?: A Little Help from another person to put on and taking off regular upper body clothing?: None Help from another person to put on and taking off regular lower body clothing?: A Little 6 Click Score: 21   End of Session Nurse Communication: Mobility status  Activity Tolerance: Patient tolerated treatment well Patient left: in bed;with call bell/phone within reach;with nursing/sitter in room  OT Visit Diagnosis: Unsteadiness on feet (R26.81);Muscle weakness (generalized) (M62.81);Dizziness and giddiness (R42)                Time: 9735-3299 OT Time Calculation (min): 36 min Charges:  OT General Charges $OT Visit: 1 Visit OT Evaluation $OT Eval Moderate Complexity: 1 Mod  Elder Cyphers, OTR/L [facility] Acute Rehabilitation Office: [phone]   Magnus Ivan 07/28/2022, 12:59 PM

## 2022-07-28 NOTE — Evaluation (Signed)
Speech Language Pathology Evaluation Patient Details Name: Tracey Buchanan MRN: [id number] DOB: 03/26/1956 Today's Date: 07/28/2022 Time: 6270-3500 SLP Time Calculation (min) (ACUTE ONLY): 17 min  Problem List:  Patient Active Problem List   Diagnosis Date Noted   Acute left-sided weakness 07/27/2022   Hyperlipidemia 06/28/2022   Atypical chest pain 08/22/2017   Anxiety    Hypertension    DVT (deep venous thrombosis) ([place])    Osteopenia    Hydrosalpinx    Past Medical History:  Past Medical History:  Diagnosis Date   Anxiety    ASCUS (atypical squamous cells of undetermined significance) on Pap smear 11/2011   negative high risk HPV   DVT (deep venous thrombosis) ([place])    on birth control pills as a teenager   Hydrosalpinx    Hypertension    Osteopenia 09/2017   T score -2.0 FRAX 4.5% / 0.9%   Urinary incontinence    VAIN I (vaginal intraepithelial neoplasia grade I) 11/2010   negative colposcopy   Vitamin D deficiency 06/2015   value 13 recommended 50,000 units weekly 12 with recheck of vitamin D level   Past Surgical History:  Past Surgical History:  Procedure Laterality Date   ABDOMINAL HYSTERECTOMY  2001   Leiomyomata   CARDIAC CATHETERIZATION     MYOMECTOMY ABDOMINAL APPROACH  1996   PELVIC LAPAROSCOPY     HPI:  66 y.o. female presenting to [place] ED on 11/22 with decreased balance, gait, and LLE weakness. MRI revealed no acute intracranial process or acute or subacute infarct. PMH significant for HTN, HLD, osteopenia, DVT, and anxiety.   Assessment / Plan / Recommendation Clinical Impression  Pt presents with normal expressive and receptive language; speech is clear and fluent.  Cognition is WFL in domains of attention, orientation, memory, and problem-solving. No SLP needs identified. Reviewed BE FAST acronym.  SLP will sign off.    SLP Assessment  SLP Recommendation/Assessment: Patient does not need any further Speech Lanaguage Pathology Services     Recommendations for follow up therapy are one component of a multi-disciplinary discharge planning process, led by the attending physician.  Recommendations may be updated based on patient status, additional functional criteria and insurance authorization.    Follow Up Recommendations  No SLP follow up    Assistance Recommended at Discharge  None                 SLP Evaluation Cognition  Overall Cognitive Status: Within Functional Limits for tasks assessed Arousal/Alertness: Awake/alert Orientation Level: Oriented X4 Attention: Selective Selective Attention: Appears intact Memory: Appears intact Awareness: Appears intact Problem Solving: Appears intact Safety/Judgment: Appears intact       Comprehension  Auditory Comprehension Overall Auditory Comprehension: Appears within functional limits for tasks assessed    Expression Expression Primary Mode of Expression: Verbal Verbal Expression Overall Verbal Expression: Appears within functional limits for tasks assessed   Oral / Motor  Oral Motor/Sensory Function Overall Oral Motor/Sensory Function: Within functional limits Motor Speech Overall Motor Speech: Appears within functional limits for tasks assessed            Juan Quam Laurice 07/28/2022, 11:39 AM Estill Bamberg L. Tivis Ringer, MA CCC/SLP Clinical Specialist - [place] Office number [phone]

## 2022-07-28 NOTE — Evaluation (Signed)
Physical Therapy Evaluation Patient Details Name: Tracey Buchanan MRN: [id number] DOB: 1956/06/06 Today's Date: 07/28/2022  History of Present Illness  66 y.o. female presenting to [facility] ED on 11/22 with decreased balance, gait, and LLE weakness. MRI revealed no acute intracranial process or acute or subacute infarct. PMH significant for HTN, HLD, osteopenia, DVT, and anxiety.   Clinical Impression  Pt in bed upon arrival of PT, agreeable to evaluation at this time. Prior to admission the pt was independent with all mobility without use of DME, reports taking care of her 79yo mother part time, driving, and independent with ADLs/IADLs. The pt was able to complete multiple sit-stand transfers without assist, but does hold onto bed or counter due to reports of dizziness. She is able to ambulate in the room and hallway without UE support, but demos slowed speed, guarded stance, and wide BOS. No overt LOB but pt with slowed movements and self-limiting due to dizziness. The pt was unable to tolerate prolonged standing or head turns during testing, but then reports constant dizziness and nausea through session. BP stable with all changes in position. Will benefit from vestibular PT assessment to rule out vestibular system involvement.   VITALS:  - sitting EOB - BP: 123/64 (83); HR: 67bpm - standing - BP: 115/76 (89); HR: 75bpm - after ambulation - BP: 124/62 (79); HR: 61bpm   Recommendations for follow up therapy are one component of a multi-disciplinary discharge planning process, led by the attending physician.  Recommendations may be updated based on patient status, additional functional criteria and insurance authorization.  Follow Up Recommendations Outpatient PT (OP vestibular PT)      Assistance Recommended at Discharge Intermittent Supervision/Assistance  Patient can return home with the following  A little help with walking and/or transfers;Assistance with cooking/housework;Direct  supervision/assist for medications management;Direct supervision/assist for financial management;Assist for transportation;Help with stairs or ramp for entrance    Equipment Recommendations Rolling walker (2 wheels)  Recommendations for Other Services       Functional Status Assessment Patient has had a recent decline in their functional status and demonstrates the ability to make significant improvements in function in a reasonable and predictable amount of time.     Precautions / Restrictions Precautions Precautions: Fall Precaution Comments: dizziness Restrictions Weight Bearing Restrictions: No      Mobility  Bed Mobility Overal bed mobility: Independent             General bed mobility comments: HOB elevated but pt not needing assist    Transfers Overall transfer level: Needs assistance Equipment used: None Transfers: Sit to/from Stand Sit to Stand: Supervision           General transfer comment: supervision for transfers for safety, no overt LOB    Ambulation/Gait Ambulation/Gait assistance: Min guard Gait Distance (Feet): 45 Feet (+ 15 ft) Assistive device: None Gait Pattern/deviations: Step-through pattern, Decreased stride length, Wide base of support Gait velocity: decreased Gait velocity interpretation: <1.31 ft/sec, indicative of household ambulator   General Gait Details: pt with wide BOS and slowed movements with gait in hallway, more quick with turning and movement when ambulating in the room. No overt LOB but increased sway and reports of dizziness with turning in hallway      Balance Overall balance assessment: Needs assistance Sitting-balance support: No upper extremity supported, Feet supported Sitting balance-Leahy Scale: Good     Standing balance support: No upper extremity supported, During functional activity Standing balance-Leahy Scale: Fair Standing balance comment: can ambulate without  LOB, increased sway and instability                              Pertinent Vitals/Pain Pain Assessment Pain Assessment: No/denies pain    Home Living Family/patient expects to be discharged to:: Private residence Living Arrangements: Alone Available Help at Discharge: Family;Friend(s);Available PRN/intermittently Type of Home: House Home Access: Level entry       Home Layout: One level Home Equipment: Conservation officer, nature (2 wheels) (RW is her mothers)      Prior Function Prior Level of Function : Independent/Modified Independent;Driving             Mobility Comments: pt reports no issues prior to onset of dizziness, no falls. does not use DME. ADLs Comments: pt reports significant amount of stress with family/responsibilities. reports independence with ADLs, driving, and takes care of 36yo mother when she visits     Hand Dominance   Dominant Hand: Right    Extremity/Trunk Assessment   Upper Extremity Assessment Upper Extremity Assessment: Defer to OT evaluation    Lower Extremity Assessment Lower Extremity Assessment: LLE deficits/detail LLE Deficits / Details: grossly 4/5 to MMT with weakest at knee flexion. pt reports this is more painful than weak but unable to get clear answer. reports back of L calf feels "like a vein" LLE Sensation: decreased light touch (pt reports back of L calf "feels like a vein")    Cervical / Trunk Assessment Cervical / Trunk Assessment: Normal  Communication   Communication: No difficulties  Cognition Arousal/Alertness: Awake/alert Behavior During Therapy: Impulsive, Anxious Overall Cognitive Status: No family/caregiver present to determine baseline cognitive functioning                                 General Comments: tangential at times, able to follow all cues and instructions but did need increased explanation. seems very anxious in general about life events and responsibilties        General Comments General comments (skin integrity, edema,  etc.): BP stable with changes in position, pt reports constant dizziness    Exercises Other Exercises Other Exercises: attempted VOR x1 with pt reporting no change in sx, horixontal and vertical head turns, and visual scanning with no signs of nystagmus. Pt not fully moving head during testing and at times giving unclear answers regarding dizziness such as stating "I have to stand up to see" when asked if head movement made her more dizzy   Assessment/Plan    PT Assessment Patient needs continued PT services  PT Problem List Decreased strength;Decreased activity tolerance;Decreased balance;Decreased mobility       PT Treatment Interventions DME instruction;Gait training;Stair training;Functional mobility training;Therapeutic activities;Therapeutic exercise;Balance training;Neuromuscular re-education;Patient/family education    PT Goals (Current goals can be found in the Care Plan section)  Acute Rehab PT Goals Patient Stated Goal: return home and rest PT Goal Formulation: With patient Time For Goal Achievement: 08/11/22 Potential to Achieve Goals: Good    Frequency Min 3X/week        AM-PAC PT "6 Clicks" Mobility  Outcome Measure Help needed turning from your back to your side while in a flat bed without using bedrails?: None Help needed moving from lying on your back to sitting on the side of a flat bed without using bedrails?: None Help needed moving to and from a bed to a chair (including a wheelchair)?: A Little Help  needed standing up from a chair using your arms (e.g., wheelchair or bedside chair)?: A Little Help needed to walk in hospital room?: A Little Help needed climbing 3-5 steps with a railing? : A Little 6 Click Score: 20    End of Session Equipment Utilized During Treatment: Gait belt Activity Tolerance: Treatment limited secondary to medical complications (Comment) (dizziness) Patient left: in bed;with call bell/phone within reach;with nursing/sitter in  room Nurse Communication: Mobility status PT Visit Diagnosis: Other abnormalities of gait and mobility (R26.89);Dizziness and giddiness (R42)    Time: 6962-9528 PT Time Calculation (min) (ACUTE ONLY): 36 min   Charges:   PT Evaluation $PT Eval Moderate Complexity: 1 Mod          West Carbo, PT, DPT   Acute Rehabilitation Department  Sandra Cockayne 07/28/2022, 12:39 PM

## 2022-07-29 LAB — HEMOGLOBIN A1C
Hgb A1c MFr Bld: 6.8 % — ABNORMAL HIGH (ref 4.8–5.6)
Mean Plasma Glucose: 148 mg/dL

## 2022-08-01 ENCOUNTER — Telehealth (HOSPITAL_COMMUNITY): Payer: Self-pay | Admitting: Emergency Medicine

## 2022-08-01 NOTE — Telephone Encounter (Signed)
Reaching out to patient to offer assistance regarding upcoming cardiac imaging study; pt verbalizes understanding of appt date/time, parking situation and where to check in, pre-test NPO status and medications ordered, and verified current allergies; name and call back number provided for further questions should they arise Marchia Bond RN Navigator Cardiac Imaging [facility] [phone] office [phone] cell  Denies iv issues Arrival 930 '50mg'$  metoprolol since HR 63 on last EKG Aware nitro

## 2022-08-03 ENCOUNTER — Ambulatory Visit (HOSPITAL_COMMUNITY)
Admission: RE | Admit: 2022-08-03 | Discharge: 2022-08-03 | Disposition: A | Discharge status: Home / Self Care | Payer: Medicare HMO | Source: Ambulatory Visit | Attending: Cardiovascular Disease | Admitting: Cardiovascular Disease

## 2022-08-03 DIAGNOSIS — R072 Precordial pain: Secondary | ICD-10-CM | POA: Insufficient documentation

## 2022-08-03 MED ORDER — IOHEXOL 350 MG/ML SOLN
100.0000 mL | Freq: Once | INTRAVENOUS | Status: AC | PRN
  Administered 2022-08-03: 100 mL via INTRAVENOUS

## 2022-08-03 MED ORDER — NITROGLYCERIN 0.4 MG SL SUBL
SUBLINGUAL_TABLET | SUBLINGUAL | Status: AC
  Filled 2022-08-03: qty 2

## 2022-08-03 MED ORDER — NITROGLYCERIN 0.4 MG SL SUBL
0.8000 mg | SUBLINGUAL_TABLET | Freq: Once | SUBLINGUAL | Status: AC
  Administered 2022-08-03: 0.8 mg via SUBLINGUAL

## 2022-08-04 ENCOUNTER — Other Ambulatory Visit (HOSPITAL_COMMUNITY): Payer: Medicare HMO

## 2022-08-10 DIAGNOSIS — F330 Major depressive disorder, recurrent, mild: Secondary | ICD-10-CM | POA: Diagnosis not present

## 2022-08-10 DIAGNOSIS — F17210 Nicotine dependence, cigarettes, uncomplicated: Secondary | ICD-10-CM | POA: Diagnosis not present

## 2022-08-10 DIAGNOSIS — I10 Essential (primary) hypertension: Secondary | ICD-10-CM | POA: Diagnosis not present

## 2022-08-10 DIAGNOSIS — K219 Gastro-esophageal reflux disease without esophagitis: Secondary | ICD-10-CM | POA: Diagnosis not present

## 2022-08-10 DIAGNOSIS — F411 Generalized anxiety disorder: Secondary | ICD-10-CM | POA: Diagnosis not present

## 2022-08-10 DIAGNOSIS — E785 Hyperlipidemia, unspecified: Secondary | ICD-10-CM | POA: Diagnosis not present

## 2022-08-10 DIAGNOSIS — R531 Weakness: Secondary | ICD-10-CM | POA: Diagnosis not present

## 2022-09-07 DIAGNOSIS — J069 Acute upper respiratory infection, unspecified: Secondary | ICD-10-CM | POA: Diagnosis not present

## 2022-09-07 DIAGNOSIS — J209 Acute bronchitis, unspecified: Secondary | ICD-10-CM | POA: Diagnosis not present

## 2022-09-09 ENCOUNTER — Telehealth: Payer: Self-pay

## 2022-09-09 NOTE — Patient Outreach (Signed)
[facility]  09/09/2022  Tracey Buchanan 05-24-1956 [id number]   Patient called and wanted to know about her shower chair. She did not have to OT telephone number. I provided phone number for the patient to call [name].  I also sent an email to [place] to provide FYI  [name] RN, BSN, CEN RN Case Freight forwarder for [employer] Mobile: [phone]

## 2022-09-19 ENCOUNTER — Other Ambulatory Visit: Payer: Self-pay | Admitting: Occupational Therapy

## 2022-09-19 NOTE — Patient Outreach (Signed)
Aging Gracefully Program  OT FINAL Visit  09/19/2022  Tracey Buchanan Apr 24, 1956 [id number]  Visit:  4- Fourth Visit  Start Time:  4034 End Time:  7425 Total Minutes:  40  Readiness to Change:  Readiness to Change Score: 8    Durable Medical Equipment: Durable Medical Equipment: Shower Chair With Back Durable Medical Equipment Distribution Date: 09/19/22  Patient Education: Education Provided: Yes Education Details: Civil engineer, contracting, Tips for Aging book Person(s) Educated: Patient Comprehension: Verbalized Understanding, Returned Demonstration  Goals:  Goals Addressed             This Visit's Progress    COMPLETED: Patient Stated       Patient will improve ability to get up and down from commode with minimal pain and difficulty.  ACTION PLANNING - FUNCTIONAL MOBILITY   Target Problem Area:   Getting up from and down to the commode  Why Problem May Occur:   -low commode height  -limited LB mobility  -limited options for holding-grab bars, countertop, etc.        Target Goal:   To get up from and down to commode safely and independently     STRATEGIES  Saving Your Energy:  DO:  DON'T:   Take breaks   Don't rush   Raise the height of surfaces -install raised toilet seat    Remove tripping hazards         Modifying your home environment and making it safe:  DO:  DON'T:   Install grab bars in the bathroom  Hold onto unsafe surfaces (towel racks, toilet paper holder, etc.)   Remove or strongly secure throw rugs      Provide adequate lighting  Use dim lights or lights that cast a lot of shadows     Simplifying the way you set up tasks or daily routines:  DO:  DON'T:   Move slowly  Rush during transfers or walking    Practice  It is important to practice the strategies so we can determine if they will be effective in helping to reach your goal.  Follow these specific recommendations:  1. Install raised commode and grab bars if possible 2. Use raised toilet  seat with handles if needed 3. Use non-slip bath mats at commode if you need a mat   If a strategy does not work the first time, try it again and again (and maybe again).  We may make some changes over the next few sessions, based on how they work.    [name], OTR/L     COMPLETED: Patient Stated       Patient will improve safety and independence in getting into and out of shower and completing bathing tasks using DME/AE as needed.   ACTION PLANNING - BATHING  Target Problem Area:   Getting into and out of the shower and performing bathing tasks safely   Why Problem May Occur:   -limited mobility  -fear of falling  -no seat or unsafe seating  -no grab bars  -high tub sides  Target Goal:   To perform bathing tasks and shower transfers safely and independently     STRATEGIES  Saving Your Energy:  DO:  DON'T:   Use a tub bench/seat  Don't stand while bathing, it uses more energy   Use appropriate adaptive equipment:  long handled sponge, soap on a rope  Don't rush   Keep all items you'll need within easy reach       Modifying your  home environment and making it safe:  DO:  DON'T:   Install grab bars in the shower and next to the toilet   Don't use towel bars  Place a rubber mat along the entire length of the tub or use tub grippers Don't place loose rugs in the bathroom- they can trip you or your walker/cane can get caught  Make sure the bathroom is well lit      Install a hand held shower head       Simplifying the way you set up tasks or daily routines:  DO:  DON'T:   Plan to bathe/shower before you're overly tired  Don't rush through bathing/showering    Gather all items before getting started       Practice  It is important to practice the strategies so we can determine if they will be effective in helping to reach your goal.  Follow these specific recommendations:  1. Use a shower seat/tub bench and sit for bathing, especially when needing to close your eyes 2.  Shower when feeling good, not when overly tired 3. Use adaptive equipment as needed such as a long handled sponge, to reach difficult areas   [name], OTR/L          Post Clinical Reasoning: Client Action (Goal) Three Interventions: Patient will improve safety and independence in getting into and out of shower and completing bathing tasks using DME/AE as needed. Did Client Try?: No Reason Client Did Not Try?: Other Targeted Problem Area Status: A Lot Better  Client Action (Goal) Four Interventions: Patient will improve ability to get up and down from commode with minimal pain and difficulty. Did Client Try?: Yes Targeted Problem Area Status: A Lot Better  Clinician View Of Client Situation:: Client has had commodes and grab bars installed. She is excited about her shower seat. Recently had a TIA and seems concerned about her future health, is motivated to take steps to maintain her current health. Discussed signs and symptoms of CVA and encouraged pt to go get checked out if experiencing any symptoms.  Client View Of His/Her Situation:: Pt reporting some intermittent leg pain. She is happy with her modifications. Tested height of shower seat, which is appropriate. Pt reviewing handout and booklet. No further questions regarding program, is happy with program components and the work completed.  Next Visit Plan:: N/A-discharging today   [name], OTR/L  [phone] 09/19/22

## 2022-09-21 DIAGNOSIS — F4322 Adjustment disorder with anxiety: Secondary | ICD-10-CM | POA: Diagnosis not present

## 2022-10-12 DIAGNOSIS — Z0000 Encounter for general adult medical examination without abnormal findings: Secondary | ICD-10-CM | POA: Diagnosis not present

## 2022-10-12 DIAGNOSIS — M8588 Other specified disorders of bone density and structure, other site: Secondary | ICD-10-CM | POA: Diagnosis not present

## 2022-10-12 DIAGNOSIS — F330 Major depressive disorder, recurrent, mild: Secondary | ICD-10-CM | POA: Diagnosis not present

## 2022-10-12 DIAGNOSIS — J432 Centrilobular emphysema: Secondary | ICD-10-CM | POA: Diagnosis not present

## 2022-10-12 DIAGNOSIS — I10 Essential (primary) hypertension: Secondary | ICD-10-CM | POA: Diagnosis not present

## 2022-10-12 DIAGNOSIS — I700 Atherosclerosis of aorta: Secondary | ICD-10-CM | POA: Diagnosis not present

## 2022-10-12 DIAGNOSIS — F17210 Nicotine dependence, cigarettes, uncomplicated: Secondary | ICD-10-CM | POA: Diagnosis not present

## 2022-10-12 DIAGNOSIS — F411 Generalized anxiety disorder: Secondary | ICD-10-CM | POA: Diagnosis not present

## 2022-10-12 DIAGNOSIS — E785 Hyperlipidemia, unspecified: Secondary | ICD-10-CM | POA: Diagnosis not present

## 2022-10-27 DIAGNOSIS — R110 Nausea: Secondary | ICD-10-CM | POA: Diagnosis not present

## 2022-10-27 DIAGNOSIS — F419 Anxiety disorder, unspecified: Secondary | ICD-10-CM | POA: Diagnosis not present

## 2022-10-27 DIAGNOSIS — R531 Weakness: Secondary | ICD-10-CM | POA: Diagnosis not present

## 2022-11-22 ENCOUNTER — Other Ambulatory Visit: Payer: Self-pay

## 2022-11-22 NOTE — Patient Outreach (Signed)
Aging Gracefully Program  11/22/2022  WILNA STGELAIS 1956-02-08 [id number]   [facility] Evaluation Interviewer made contact with patient. Pt asked that we give her a call back after the 26th. Will follow up next week.   [place] Management Assistant [phone]

## 2022-12-08 ENCOUNTER — Ambulatory Visit: Payer: Self-pay | Admitting: *Deleted

## 2022-12-08 NOTE — Telephone Encounter (Signed)
  Chief Complaint: bilateral legs "heaviness" Symptoms: legs ache, heaviness reported. Swelling upper thighs, hands and face. C/o burning sensation under feet. Feels cold , hx stroke in November 2023. Patient is a smoker Frequency: since 07/2022 Pertinent Negatives: Patient denies chest pain no difficulty breathing no fever, no weakness of either side of body no neurological sx.  Disposition: [] ED /[x] Urgent Care (no appt availability in office) / [] Appointment(In office/virtual)/ []  [facility]/ [] Home Care/ [] Refused Recommended Disposition /[] [facility]/ []  Follow-up with PCP Additional Notes:   Recommended UC/ED. Patient reports she has tried to contact PCP and was referred to neurologist.      Reason for Disposition  SEVERE leg swelling (e.g., swelling extends above knee, entire leg is swollen, weeping fluid)  Answer Assessment - Initial Assessment Questions 1. ONSET: "When did the swelling start?" (e.g., minutes, hours, days)     Since November 11/23. 2. LOCATION: "What part of the leg is swollen?"  "Are both legs swollen or just one leg?"     Above knees in thighs 3. SEVERITY: "How bad is the swelling?" (e.g., localized; mild, moderate, severe)   - Localized: Small area of swelling localized to one leg.   - MILD pedal edema: Swelling limited to foot and ankle, pitting edema < 1/4 inch (6 mm) deep, rest and elevation eliminate most or all swelling.   - MODERATE edema: Swelling of lower leg to knee, pitting edema > 1/4 inch (6 mm) deep, rest and elevation only partially reduce swelling.   - SEVERE edema: Swelling extends above knee, facial or hand swelling present.      Severe above knees face and hands 4. REDNESS: "Does the swelling look red or infected?"     No  5. PAIN: "Is the swelling painful to touch?" If Yes, ask: "How painful is it?"   (Scale 1-10; mild, moderate or severe)     Burning sensation  under feet 6. FEVER: "Do you have a fever?" If Yes,  ask: "What is it, how was it measured, and when did it start?"      na 7. CAUSE: "What do you think is causing the leg swelling?"     Not sure  8. MEDICAL HISTORY: "Do you have a history of blood clots (e.g., DVT), cancer, heart failure, kidney disease, or liver failure?"     Hx heart issues  9. RECURRENT SYMPTOM: "Have you had leg swelling before?" If Yes, ask: "When was the last time?" "What happened that time?"     Na  10. OTHER SYMPTOMS: "Do you have any other symptoms?" (e.g., chest pain, difficulty breathing)       Leg swelling in bilateral thighs, hands and face. Legs feel "heavy". No pain sitting heaviness causes pain in legs. Under feet burning sensation 11. PREGNANCY: "Is there any chance you are pregnant?" "When was your last menstrual period?"       na  Protocols used: Leg Swelling and Edema-A-AH

## 2022-12-09 ENCOUNTER — Other Ambulatory Visit: Payer: Self-pay

## 2022-12-09 NOTE — Patient Outreach (Signed)
Aging Gracefully Program  12/09/2022  LEKISHA NOBBS Mar 31, 1956 [id number]   [facility] Evaluation Interviewer made contact with patient. Pt is very busy and will contact me directly once she is available.   [place] Care Management Assistant [phone]

## 2022-12-15 DIAGNOSIS — F4322 Adjustment disorder with anxiety: Secondary | ICD-10-CM | POA: Diagnosis not present

## 2022-12-23 ENCOUNTER — Other Ambulatory Visit: Payer: Self-pay

## 2022-12-23 ENCOUNTER — Ambulatory Visit: Payer: Medicare HMO | Attending: Nurse Practitioner | Admitting: Nurse Practitioner

## 2022-12-23 ENCOUNTER — Encounter: Payer: Self-pay | Admitting: Nurse Practitioner

## 2022-12-23 VITALS — BP 140/88 | Ht 67.0 in | Wt 205.4 lb

## 2022-12-23 DIAGNOSIS — M79605 Pain in left leg: Secondary | ICD-10-CM

## 2022-12-23 DIAGNOSIS — G459 Transient cerebral ischemic attack, unspecified: Secondary | ICD-10-CM

## 2022-12-23 DIAGNOSIS — E782 Mixed hyperlipidemia: Secondary | ICD-10-CM

## 2022-12-23 DIAGNOSIS — I10 Essential (primary) hypertension: Secondary | ICD-10-CM

## 2022-12-23 DIAGNOSIS — M79604 Pain in right leg: Secondary | ICD-10-CM

## 2022-12-23 DIAGNOSIS — R600 Localized edema: Secondary | ICD-10-CM

## 2022-12-23 DIAGNOSIS — R0789 Other chest pain: Secondary | ICD-10-CM

## 2022-12-23 MED ORDER — OLMESARTAN MEDOXOMIL-HCTZ 40-25 MG PO TABS: 1.0000 | ORAL_TABLET | Freq: Every day | ORAL | 3 refills | Status: DC

## 2022-12-23 MED ORDER — ROSUVASTATIN CALCIUM 10 MG PO TABS: 10.0000 mg | ORAL_TABLET | Freq: Every day | ORAL | 3 refills | Status: DC

## 2022-12-23 NOTE — Progress Notes (Unsigned)
Office Visit    Patient Name: Tracey Buchanan Date of Encounter: 12/23/2022  Primary Care Provider:  Deatra James, MD Primary Cardiologist:  Nanetta Batty, MD   Chief Complaint    67 year old female with a history of atypical chest pain, hypertension, hyperlipidemia, remote DVT, possible TIA, anxiety, and obesity who presents for follow-up related to lower extremity edema.  Past Medical History    Past Medical History:  Diagnosis Date   Anxiety    ASCUS (atypical squamous cells of undetermined significance) on Pap smear 11/2011   negative high risk HPV   DVT (deep venous thrombosis)    on birth control pills as a teenager   Hydrosalpinx    Hypertension    Osteopenia 09/2017   T score -2.0 FRAX 4.5% / 0.9%   Urinary incontinence    VAIN I (vaginal intraepithelial neoplasia grade I) 11/2010   negative colposcopy   Vitamin D deficiency 06/2015   value 13 recommended 50,000 units weekly 12 with recheck of vitamin D level   Past Surgical History:  Procedure Laterality Date   ABDOMINAL HYSTERECTOMY  2001   Leiomyomata   CARDIAC CATHETERIZATION     MYOMECTOMY ABDOMINAL APPROACH  1996   PELVIC LAPAROSCOPY      Allergies  Allergies  Allergen Reactions   Amlodipine Besylate     Muscle pain, leg weakness, heavy feeling in legs    Cymbalta [Duloxetine Hcl] Nausea Only   Effexor [Venlafaxine] Other (See Comments)    Nightmares    Flexeril [Cyclobenzaprine] Other (See Comments)    Urinary frequency   Prevacid [Lansoprazole] Other (See Comments)    Lack of therapeutic effect   Prilosec [Omeprazole] Other (See Comments)    Lack of therapeutic effect   Wellbutrin Xl [Bupropion] Other (See Comments)    Nightmares    Codeine Palpitations     Labs/Other Studies Reviewed    The following studies were reviewed today:  Echo 07/2022: IMPRESSIONS     1. Left ventricular ejection fraction, by estimation, is 60 to 65%. The  left ventricle has normal function. The left  ventricle has no regional  wall motion abnormalities. There is severe asymmetric left ventricular  hypertrophy of the septal segment. Left   ventricular diastolic parameters are consistent with Grade I diastolic  dysfunction (impaired relaxation).   2. Right ventricular systolic function is normal. The right ventricular  size is normal.   3. The mitral valve is grossly normal. No evidence of mitral valve  regurgitation. No evidence of mitral stenosis.   4. The aortic valve is tricuspid. Aortic valve regurgitation is not  visualized. No aortic stenosis is present.   5. The inferior vena cava is normal in size with greater than 50%  respiratory variability, suggesting right atrial pressure of 3 mmHg.   Comparison(s): No prior Echocardiogram.   CCTA 07/2022: IMPRESSION: 1. Coronary calcium score of 0.   2. Normal coronary origin with right dominance.   3. No evidence of CAD.   Recent Labs: 07/27/2022: ALT 21; BUN 12; Creatinine, Ser 0.92; Hemoglobin 13.8; Platelets 279; Potassium 3.6; Sodium 141  Recent Lipid Panel    Component Value Date/Time   CHOL 201 (H) 07/28/2022 0243   TRIG 215 (H) 07/28/2022 0243   HDL 45 07/28/2022 0243   CHOLHDL 4.5 07/28/2022 0243   VLDL 43 (H) 07/28/2022 0243   LDLCALC 113 (H) 07/28/2022 0243    History of Present Illness    67 year old female with the above past medical history including  atypical chest pain, hypertension, hyperlipidemia, remote DVT, possible TIA, anxiety, and obesity.  Myoview in 2019 in the setting of chest pain was normal.  She was last seen in the office on 06/28/2022 and noted intermittent chest discomfort, with associated shortness of breath.  Coronary CT angiogram was recommended which revealed coronary calcium score of 0, no evidence of CAD.  She was hospitalized in November 2023 with acute left-sided weakness, possible TIA versus stress response.  CT of the head and MRI of the brain were negative for acute process.  CTA of  the head and neck showed mild narrowing in the proximal right P2. Echocardiogram at that time showed EF 60 to 65%, normal LV function, no RWMA, severe asymmetric LVH at the septal segment, G1 DD, normal RV systolic function, no significant valvular abnormalities.   She presents today for follow-up.  Since her last visit she has been stable from a cardiac standpoint.  She has noted heaviness, aching in her thighs bilaterally, foot pain.  Her symptoms have improved dramatically since stopping amlodipine.  She does not wish to take this medication ever again.  I suspect some of her symptoms could be arthralgia from statin therapy.  Will stop Lipitor and start Crestor 10 mg daily.  If she tolerates this consider increasing to 20 mg daily given LDL goal less than 70 with history of TIA.  If symptoms persist, consider statin holiday.  BP is elevated in office today, she has not taken her medication yet this morning.  Will increase olmesartan/HCTZ to 40-25 mg daily.  Continue to monitor BP report to be considered in 130/80.  Will check BMET in 2 weeks.  Follow-up in 2 months.  She continues to smoke.  Full cessation advised.  She denies any chest pain, palpitations, dyspnea, edema, PND, orthopnea, weight gain.  Lower extremity edema: Atypical chest pain: CCTA in 06/2022 revealed coronary calcium score of 0, no evidence of CAD.    Hypertension: BP mildly elevated in office today Hyperlipidemia: LDL was 75 in 10/2022.  Possible TIA: Tobacco use:   Disposition:    Home Medications    Current Outpatient Medications  Medication Sig Dispense Refill   aspirin EC 81 MG tablet Take 1 tablet (81 mg total) by mouth daily. Swallow whole. 30 tablet 12   FLUoxetine (PROZAC) 20 MG capsule Take 1 capsule (20 mg total) by mouth daily. 30 capsule 12   ibuprofen (ADVIL) 200 MG tablet Take 400 mg by mouth 2 (two) times daily as needed for headache or mild pain.     LORazepam (ATIVAN) 0.5 MG tablet Take 0.5 mg by mouth 2  (two) times daily as needed for anxiety.     olmesartan-hydrochlorothiazide (BENICAR HCT) 40-25 MG tablet Take 1 tablet by mouth daily. 90 tablet 3   rosuvastatin (CRESTOR) 10 MG tablet Take 1 tablet (10 mg total) by mouth daily. 90 tablet 3   triamcinolone cream (KENALOG) 0.1 % Apply 1 Application topically 2 (two) times daily as needed (irritation).     omeprazole (PRILOSEC) 40 MG capsule Take 1 capsule (40 mg total) by mouth daily. 30 capsule 0   No current facility-administered medications for this visit.     Review of Systems    She denies chest pain, palpitations, dyspnea, pnd, orthopnea, n, v, dizziness, syncope, edema, weight gain, or early satiety. All other systems reviewed and are otherwise negative except as noted above.   Physical Exam    VS:  BP (!) 140/88   Ht 5\' 7"  (  1.702 m)   Wt 205 lb 6.4 oz (93.2 kg)   SpO2 97%   BMI 32.17 kg/m  GEN: Well nourished, well developed, in no acute distress. HEENT: normal. Neck: Supple, no JVD, carotid bruits, or masses. Cardiac: RRR, no murmurs, rubs, or gallops. No clubbing, cyanosis, edema.  Radials/DP/PT 2+ and equal bilaterally.  Respiratory:  Respirations regular and unlabored, clear to auscultation bilaterally. GI: Soft, nontender, nondistended, BS + x 4. MS: no deformity or atrophy. Skin: warm and dry, no rash. Neuro:  Strength and sensation are intact. Psych: Normal affect.  Accessory Clinical Findings    ECG personally reviewed by me today -NSR, 61 bpm- no acute changes.   Lab Results  Component Value Date   WBC 7.5 07/27/2022   HGB 13.8 07/27/2022   HCT 42.7 07/27/2022   MCV 82.9 07/27/2022   PLT 279 07/27/2022   Lab Results  Component Value Date   CREATININE 0.92 07/27/2022   BUN 12 07/27/2022   NA 141 07/27/2022   K 3.6 07/27/2022   CL 108 07/27/2022   CO2 26 07/27/2022   Lab Results  Component Value Date   ALT 21 07/27/2022   AST 20 07/27/2022   ALKPHOS 92 07/27/2022   BILITOT 0.4 07/27/2022    Lab Results  Component Value Date   CHOL 201 (H) 07/28/2022   HDL 45 07/28/2022   LDLCALC 113 (H) 07/28/2022   TRIG 215 (H) 07/28/2022   CHOLHDL 4.5 07/28/2022    Lab Results  Component Value Date   HGBA1C 6.8 (H) 07/28/2022    Assessment & Plan    1.  ***  HYPERTENSION CONTROL Vitals:   12/23/22 0908 12/23/22 0925  BP: (!) 142/90 (!) 140/88    The patient's blood pressure is elevated above target today. {Click here if intervention needs to be changed Refresh Note :1}  In order to address the patient's elevated BP: A current anti-hypertensive medication was adjusted today.; Blood pressure will be monitored at home to determine if medication changes need to be made.      Joylene Grapes, NP 12/23/2022, 5:08 PM

## 2022-12-23 NOTE — Patient Instructions (Addendum)
Medication Instructions:  Increase Olmesartan/Hydrochlorothiazide 40/25 mg daily Start Crestor 10 mg daily.  *If you need a refill on your cardiac medications before your next appointment, please call your pharmacy*   Lab Work: Your physician recommends that you return for lab work in 2 weeks. BMET  If you have labs (blood work) drawn today and your tests are completely normal, you will receive your results only by: MyChart Message (if you have MyChart) OR A paper copy in the mail If you have any lab test that is abnormal or we need to change your treatment, we will call you to review the results.   Testing/Procedures: NONE ordered at this time of appointment     Follow-Up: At [facility], you and your health needs are our priority.  As part of our continuing mission to provide you with exceptional heart care, we have created designated Provider Care Teams.  These Care Teams include your primary Cardiologist (physician) and Advanced Practice Providers (APPs -  Physician Assistants and Nurse Practitioners) who all work together to provide you with the care you need, when you need it.  We recommend signing up for the patient portal called "MyChart".  Sign up information is provided on this After Visit Summary.  MyChart is used to connect with patients for Virtual Visits (Telemedicine).  Patients are able to view lab/test results, encounter notes, upcoming appointments, etc.  Non-urgent messages can be sent to your provider as well.   To learn more about what you can do with MyChart, go to [email or web address].    Your next appointment:   2 month(s)  Provider:   Bernadene Person, NP        Other Instructions Monitor Blood pressure. Goal BP is 130/80

## 2022-12-25 ENCOUNTER — Encounter: Payer: Self-pay | Admitting: Nurse Practitioner

## 2023-01-01 ENCOUNTER — Other Ambulatory Visit: Payer: Self-pay | Admitting: Acute Care

## 2023-01-01 DIAGNOSIS — Z122 Encounter for screening for malignant neoplasm of respiratory organs: Secondary | ICD-10-CM

## 2023-01-01 DIAGNOSIS — F17210 Nicotine dependence, cigarettes, uncomplicated: Secondary | ICD-10-CM

## 2023-01-01 DIAGNOSIS — Z87891 Personal history of nicotine dependence: Secondary | ICD-10-CM

## 2023-01-10 DIAGNOSIS — F4322 Adjustment disorder with anxiety: Secondary | ICD-10-CM | POA: Diagnosis not present

## 2023-01-12 DIAGNOSIS — R600 Localized edema: Secondary | ICD-10-CM | POA: Diagnosis not present

## 2023-01-12 DIAGNOSIS — E782 Mixed hyperlipidemia: Secondary | ICD-10-CM | POA: Diagnosis not present

## 2023-01-12 DIAGNOSIS — I10 Essential (primary) hypertension: Secondary | ICD-10-CM | POA: Diagnosis not present

## 2023-01-12 DIAGNOSIS — G459 Transient cerebral ischemic attack, unspecified: Secondary | ICD-10-CM | POA: Diagnosis not present

## 2023-01-12 LAB — BASIC METABOLIC PANEL
BUN/Creatinine Ratio: 13 (ref 12–28)
BUN: 14 mg/dL (ref 8–27)
CO2: 25 mmol/L (ref 20–29)
Calcium: 9.8 mg/dL (ref 8.7–10.3)
Chloride: 104 mmol/L (ref 96–106)
Creatinine, Ser: 1.05 mg/dL — ABNORMAL HIGH (ref 0.57–1.00)
Glucose: 116 mg/dL — ABNORMAL HIGH (ref 70–99)
Potassium: 3.7 mmol/L (ref 3.5–5.2)
Sodium: 142 mmol/L (ref 134–144)
eGFR: 58 mL/min/{1.73_m2} — ABNORMAL LOW (ref >59)

## 2023-01-16 ENCOUNTER — Telehealth: Payer: Self-pay

## 2023-01-16 NOTE — Telephone Encounter (Signed)
Left a detailed message with lab results. Pt advised to call back with any questions or concerns.  

## 2023-01-24 DIAGNOSIS — F4322 Adjustment disorder with anxiety: Secondary | ICD-10-CM | POA: Diagnosis not present

## 2023-01-25 ENCOUNTER — Ambulatory Visit: Payer: Medicare HMO | Admitting: Diagnostic Neuroimaging

## 2023-01-26 ENCOUNTER — Other Ambulatory Visit: Payer: Medicare HMO

## 2023-02-03 DIAGNOSIS — B009 Herpesviral infection, unspecified: Secondary | ICD-10-CM | POA: Diagnosis not present

## 2023-02-03 DIAGNOSIS — L988 Other specified disorders of the skin and subcutaneous tissue: Secondary | ICD-10-CM | POA: Diagnosis not present

## 2023-02-03 DIAGNOSIS — H524 Presbyopia: Secondary | ICD-10-CM | POA: Diagnosis not present

## 2023-02-03 DIAGNOSIS — H52223 Regular astigmatism, bilateral: Secondary | ICD-10-CM | POA: Diagnosis not present

## 2023-02-03 DIAGNOSIS — Z135 Encounter for screening for eye and ear disorders: Secondary | ICD-10-CM | POA: Diagnosis not present

## 2023-02-15 DIAGNOSIS — F4322 Adjustment disorder with anxiety: Secondary | ICD-10-CM | POA: Diagnosis not present

## 2023-02-28 ENCOUNTER — Ambulatory Visit
Admission: RE | Admit: 2023-02-28 | Discharge: 2023-02-28 | Disposition: A | Discharge status: Home / Self Care | Payer: Medicare HMO | Source: Ambulatory Visit | Attending: Acute Care | Admitting: Acute Care

## 2023-02-28 DIAGNOSIS — F17210 Nicotine dependence, cigarettes, uncomplicated: Secondary | ICD-10-CM

## 2023-02-28 DIAGNOSIS — Z122 Encounter for screening for malignant neoplasm of respiratory organs: Secondary | ICD-10-CM

## 2023-02-28 DIAGNOSIS — Z87891 Personal history of nicotine dependence: Secondary | ICD-10-CM

## 2023-03-07 ENCOUNTER — Encounter: Payer: Self-pay | Admitting: Diagnostic Neuroimaging

## 2023-03-07 ENCOUNTER — Ambulatory Visit (INDEPENDENT_AMBULATORY_CARE_PROVIDER_SITE_OTHER): Payer: Medicare HMO | Admitting: Diagnostic Neuroimaging

## 2023-03-07 VITALS — BP 134/73 | HR 67 | Ht 65.0 in | Wt 194.0 lb

## 2023-03-07 DIAGNOSIS — G459 Transient cerebral ischemic attack, unspecified: Secondary | ICD-10-CM

## 2023-03-07 NOTE — Patient Instructions (Signed)
  Continue aspirin 81mg  daily, rosuvastatin 10mg , BP control  Diabetes monitoring (A1c 6.8) --> adjust nutrition and exercise; follow up with PCP

## 2023-03-07 NOTE — Progress Notes (Signed)
[facility]  PATIENT: Tracey Buchanan DOB: 1956/01/23  REFERRING CLINICIAN: Scott-Lowe, Campbell Lerner, MD HISTORY FROM: patient REASON FOR VISIT: new consult   HISTORICAL  CHIEF COMPLAINT:  Chief Complaint  Patient presents with   New Patient (Initial Visit)    Rm 6, here alone  Pt is here for left sided weakness. Pt had a TIA in 07/27/2022. States legs feel heavy and ache, sometimes feet feel like needles are in them.    HISTORY OF PRESENT ILLNESS:   67 year old female here for evaluation of left-sided weakness.  History of hypertension, hyperlipidemia, presented to emergency room in November 2023 for gait difficulty, unsteadiness, left-sided weakness.  MRI brain was negative for stroke.  CTA of the head and neck showed mild narrowing of right P2 proximally.  She was treated with dual antiplatelet for 3 weeks then aspirin alone.  Since then symptoms have essentially resolved.  Continues to have some intermittent pain in her left knee and left leg.  Has some low back pain.  Has been to orthopedic clinic in the past for left knee issues.   REVIEW OF SYSTEMS: Full 14 system review of systems performed and negative with exception of: as per HPI.  ALLERGIES: Allergies  Allergen Reactions   Amlodipine Besylate     Muscle pain, leg weakness, heavy feeling in legs    Cymbalta [Duloxetine Hcl] Nausea Only   Effexor [Venlafaxine] Other (See Comments)    Nightmares    Flexeril [Cyclobenzaprine] Other (See Comments)    Urinary frequency   Prevacid [Lansoprazole] Other (See Comments)    Lack of therapeutic effect   Prilosec [Omeprazole] Other (See Comments)    Lack of therapeutic effect   Wellbutrin Xl [Bupropion] Other (See Comments)    Nightmares    Codeine Palpitations    HOME MEDICATIONS: Outpatient Medications Prior to Visit  Medication Sig Dispense Refill   aspirin EC 81 MG tablet Take 1 tablet (81 mg total) by mouth daily. Swallow whole. 30 tablet 12    FLUoxetine (PROZAC) 20 MG capsule Take 1 capsule (20 mg total) by mouth daily. 30 capsule 12   ibuprofen (ADVIL) 200 MG tablet Take 400 mg by mouth 2 (two) times daily as needed for headache or mild pain.     LORazepam (ATIVAN) 0.5 MG tablet Take 0.5 mg by mouth 2 (two) times daily as needed for anxiety.     olmesartan-hydrochlorothiazide (BENICAR HCT) 40-25 MG tablet Take 1 tablet by mouth daily. 90 tablet 3   rosuvastatin (CRESTOR) 10 MG tablet Take 1 tablet (10 mg total) by mouth daily. 90 tablet 3   triamcinolone cream (KENALOG) 0.1 % Apply 1 Application topically 2 (two) times daily as needed (irritation).     omeprazole (PRILOSEC) 40 MG capsule Take 1 capsule (40 mg total) by mouth daily. 30 capsule 0   No facility-administered medications prior to visit.    PAST MEDICAL HISTORY: Past Medical History:  Diagnosis Date   Anxiety    ASCUS (atypical squamous cells of undetermined significance) on Pap smear 11/2011   negative high risk HPV   DVT (deep venous thrombosis) (HCC)    on birth control pills as a teenager   Hydrosalpinx    Hypertension    Osteopenia 09/2017   T score -2.0 FRAX 4.5% / 0.9%   Urinary incontinence    VAIN I (vaginal intraepithelial neoplasia grade I) 11/2010   negative colposcopy   Vitamin D deficiency 06/2015   value 13 recommended 50,000 units weekly 12  with recheck of vitamin D level    PAST SURGICAL HISTORY: Past Surgical History:  Procedure Laterality Date   ABDOMINAL HYSTERECTOMY  2001   Leiomyomata   CARDIAC CATHETERIZATION     MYOMECTOMY ABDOMINAL APPROACH  1996   PELVIC LAPAROSCOPY      FAMILY HISTORY: Family History  Problem Relation Age of Onset   Diabetes Mother    Heart disease Mother    Diabetes Father    Hypertension Father    Diabetes Brother    Hypertension Brother    Sarcoidosis Brother    Sarcoidosis Brother    Diabetes Brother     SOCIAL HISTORY: Social History   Socioeconomic History   Marital status: Single     Spouse name: Not on file   Number of children: Not on file   Years of education: Not on file   Highest education level: Not on file  Occupational History   Not on file  Tobacco Use   Smoking status: Every Day    Packs/day: .25    Types: Cigarettes   Smokeless tobacco: Never  Vaping Use   Vaping Use: Never used  Substance and Sexual Activity   Alcohol use: Yes    Alcohol/week: 7.0 standard drinks of alcohol    Types: 7 Glasses of wine per week    Comment: takes 2 shots every other day   Drug use: No   Sexual activity: Not Currently    Birth control/protection: Surgical    Comment: 1st intercourse 19 yo-5 partners  Other Topics Concern   Not on file  Social History Narrative   Not on file   Social Determinants of Health   Financial Resource Strain: Not on file  Food Insecurity: Not on file  Transportation Needs: Not on file  Physical Activity: Not on file  Stress: Not on file  Social Connections: Not on file  Intimate Partner Violence: Not on file     PHYSICAL EXAM  GENERAL EXAM/CONSTITUTIONAL: Vitals:  Vitals:   03/07/23 1041  BP: 134/73  Pulse: 67  Weight: 194 lb (88 kg)  Height: 5\' 5"  (1.651 m)   Body mass index is 32.28 kg/m. Wt Readings from Last 3 Encounters:  03/07/23 194 lb (88 kg)  12/23/22 205 lb 6.4 oz (93.2 kg)  07/27/22 206 lb (93.4 kg)   Patient is in no distress; well developed, nourished and groomed; neck is supple  CARDIOVASCULAR: Examination of carotid arteries is normal; no carotid bruits Regular rate and rhythm, no murmurs Examination of peripheral vascular system by observation and palpation is normal  EYES: Ophthalmoscopic exam of optic discs and posterior segments is normal; no papilledema or hemorrhages No results found.  MUSCULOSKELETAL: Gait, strength, tone, movements noted in Neurologic exam below  NEUROLOGIC: MENTAL STATUS:      No data to display         awake, alert, oriented to person, place and time recent  and remote memory intact normal attention and concentration language fluent, comprehension intact, naming intact fund of knowledge appropriate  CRANIAL NERVE:  2nd - no papilledema on fundoscopic exam 2nd, 3rd, 4th, 6th - pupils equal and reactive to light, visual fields full to confrontation, extraocular muscles intact, no nystagmus 5th - facial sensation symmetric 7th - facial strength symmetric 8th - hearing intact 9th - palate elevates symmetrically, uvula midline 11th - shoulder shrug symmetric 12th - tongue protrusion midline  MOTOR:  normal bulk and tone, full strength in the BUE, BLE  SENSORY:  normal and  symmetric to light touch, pinprick, temperature, vibration  COORDINATION:  finger-nose-finger, fine finger movements normal  REFLEXES:  deep tendon reflexes TRACE and symmetric  GAIT/STATION:  narrow based gait; SLIGHTLY ANTALGIC GAIT     DIAGNOSTIC DATA (LABS, IMAGING, TESTING) - I reviewed patient records, labs, notes, testing and imaging myself where available.  Lab Results  Component Value Date   WBC 7.5 07/27/2022   HGB 13.8 07/27/2022   HCT 42.7 07/27/2022   MCV 82.9 07/27/2022   PLT 279 07/27/2022      Component Value Date/Time   NA 142 01/12/2023 1036   K 3.7 01/12/2023 1036   CL 104 01/12/2023 1036   CO2 25 01/12/2023 1036   GLUCOSE 116 (H) 01/12/2023 1036   GLUCOSE 116 (H) 07/27/2022 1031   BUN 14 01/12/2023 1036   CREATININE 1.05 (H) 01/12/2023 1036   CALCIUM 9.8 01/12/2023 1036   PROT 7.4 07/27/2022 1031   ALBUMIN 4.0 07/27/2022 1031   AST 20 07/27/2022 1031   ALT 21 07/27/2022 1031   ALKPHOS 92 07/27/2022 1031   BILITOT 0.4 07/27/2022 1031   GFRNONAA >60 07/27/2022 1031   GFRAA >60 02/11/2018 1522   Lab Results  Component Value Date   CHOL 201 (H) 07/28/2022   HDL 45 07/28/2022   LDLCALC 113 (H) 07/28/2022   TRIG 215 (H) 07/28/2022   CHOLHDL 4.5 07/28/2022   Lab Results  Component Value Date   HGBA1C 6.8 (H) 07/28/2022    No results found for: "VITAMINB12" No results found for: "TSH"     ASSESSMENT AND PLAN  67 y.o. year old female here with:   Dx:  1. TIA (transient ischemic attack)       PLAN:  TIA (vs stress reaction) - Nov 2023 Code Stroke CT head No acute abnormality.  CTA head & neck No intracranial large vessel occlusion or significant stenosis. Mild narrowing in the proximal right P2. MRI  No acute abnormality 2D Echo EF 60-65% LDL 113 HgbA1c 6.8  Continue aspirin 81mg  daily, rosuvastatin 10mg , BP control DM monitoring (A1c 6.8) --> adjust nutrition and exercise; follow up with PCP  Return for return to PCP, pending if symptoms worsen or fail to improve.    Suanne Marker, MD 03/07/2023, 10:46 AM Certified in Neurology, Neurophysiology and Neuroimaging  [facility] [address] [place], [place] [address] [phone]

## 2023-03-27 DIAGNOSIS — R202 Paresthesia of skin: Secondary | ICD-10-CM | POA: Diagnosis not present

## 2023-03-27 DIAGNOSIS — F4322 Adjustment disorder with anxiety: Secondary | ICD-10-CM | POA: Diagnosis not present

## 2023-03-27 DIAGNOSIS — M79671 Pain in right foot: Secondary | ICD-10-CM | POA: Diagnosis not present

## 2023-03-31 ENCOUNTER — Telehealth: Payer: Self-pay | Admitting: Acute Care

## 2023-03-31 DIAGNOSIS — F17210 Nicotine dependence, cigarettes, uncomplicated: Secondary | ICD-10-CM

## 2023-03-31 DIAGNOSIS — R911 Solitary pulmonary nodule: Secondary | ICD-10-CM

## 2023-03-31 NOTE — Telephone Encounter (Signed)
I have called the patient with the results of the low dose Ct Chest. I explained that while her scan was read as a LR 2, she has one nodule that has grown from 10.8 mm - 14.8 mm in 13 months. Rather than wait a year to re-evaluate this nodule, we will take a look at it in 6 months. This scan will be due 08/2023.  Please place order for 6 month follow up low dose Ct Chest.  Fax results to PCP  Thanks so much I have talked with the patient about the notation of Coronary artery calcifications and Aortic Atherosclerosis on the scan  . She has just been diagnosed with diabetes and has a family member who has had bypass surgery, so family history. She is seen by cardiology. I have sent a copy of the scan to Dr. Allyson Sabal. She is on statin therapy. Pt. Verbalized understanding of the above and had no further questions upon completion of the call.

## 2023-04-03 NOTE — Telephone Encounter (Signed)
Results/plan faxed to PCP and new order placed for 6 months follow up nodule/LDCT

## 2023-04-06 NOTE — Progress Notes (Signed)
Cardiology Office Note:    Date:  04/10/2023   ID:  Tracey Buchanan, DOB [birth year], MRN [id number]  PCP:  Aliene Beams, MD  Cardiologist:  Nanetta Batty, MD  Electrophysiologist:  None   Referring MD: Aliene Beams, MD   Chief Complaint: "discuss circulation in her legs"  History of Present Illness:    Tracey Buchanan is a 67 y.o. female with a history of atypical chest pain with normal coronaries on coronary CTA in 07/2022, emphysema noted on recent CT in 02/2023, hypertension, hyperlipidemia, remote DVT while on birth control as a teenager, possible TIA, obesity, anxiety, and tobacco abuse who is followed by Dr. Allyson Sabal and presents today to "discuss circulation in her legs."  Patient has a history of atypical chest pain in 2018. Myoview at that time was low risk with no evidence of ischemia. She was referred back to Dr. Allyson Sabal in 06/2022 for further evaluation of chest pain. Echo and coronary CTA were ordered for further evaluation. Before studies could be completed, she was admitted in 07/2022 for Code Stroke after presenting with left sided weakness. Head CT and brain MRI were negative. Head/ neck CTA showed no large intracranial large vessel occlusion or significant stenosis and mild narrowing in the proximal right P2. She was seen by Neurology and she was felt to have a TIA vs stress reaction given severe anxiety. She was started on DAPT with Aspirin and Plavix for 3 weeks and then Aspirin alone. Echo during admission showed LVEF of 60-65% with normal wall motion, severe asymmetric LVH of the septal segment, and grade 1 diastolic dysfunction. Coronary CTA was completed following this admission in 07/2022 and showed a coronary calcium score of 0 with no evidence of CAD.  Patient was last seen by Bernadene Person, NP, in 12/2022 at which time she was stable from a cardiac standpoint and denied any chest pain. She reported heaviness and aching in her bilateral thighs as well as bilateral foot and  ankle pain. Symptoms did improved after stopping Amlodipine. There was concern that some of her leg heaviness may e due from her statin so she was switched from Lipitor to Crestor to see if that would help with plans for statin holiday if no improvement. PAD screen was offered but patient declined. Olmesartan-HCTZ was also increased for additional BP control.  Patient presents today to "discuss the circulation in her legs." She continues to have a pins and needles and burning sensation in her bilateral feet. She also continues to have a little leg heaviness but this is substantially better than before when she was on Amlodipine and is almost complete resolved. She saw a Neurologist who checked labs and she was found to be prediabetic. She then saw her PCP and was reportedly diagnosed with type 2 diabetes based on repeat labs. She was started who put her on Metformin. Her PCP also ordered a chest CT for routine screening of lung cancer which showed some coronary calcifications. Because of this, she is concerned of poor circulation in her legs. She denies any claudication and has good distal pulses so I do not think this is PAD. Symptoms sound more like peripheral neuropathy. She also reports occasional back pain at night that radiates down her leg which sounds like possible sciatica. She describes some vague chest heaviness that occurs randomly (no associated with exertion) and does not last long. She reports her reflux medication will help with this. She also describes some atypical back pain that radiates up to  her breast. Does not sound like cardiac chest pain or angina. Reassuring given her coronary CTA was normal less than 1 year ago. She has been under a lot of stress. Both her mom and her dog have died in the last few months. Suspect stress/ anxiety is likely playing a role in her symptoms. She denies any shortness of breath, edema, palpitations, significant dizziness, or syncope.  EKGs/Labs/Other Studies  Reviewed:    The following studies were reviewed:  Echocardiogram 07/28/2022: Impressions: 1. Left ventricular ejection fraction, by estimation, is 60 to 65%. The  left ventricle has normal function. The left ventricle has no regional  wall motion abnormalities. There is severe asymmetric left ventricular  hypertrophy of the septal segment. Left   ventricular diastolic parameters are consistent with Grade I diastolic  dysfunction (impaired relaxation).   2. Right ventricular systolic function is normal. The right ventricular  size is normal.   3. The mitral valve is grossly normal. No evidence of mitral valve  regurgitation. No evidence of mitral stenosis.   4. The aortic valve is tricuspid. Aortic valve regurgitation is not  visualized. No aortic stenosis is present.   5. The inferior vena cava is normal in size with greater than 50%  respiratory variability, suggesting right atrial pressure of 3 mmHg.  _______________  Coronary CTA 08/03/2022: Impressions: 1. Coronary calcium score of 0. 2. Normal coronary origin with right dominance. 3. No evidence of CAD.  EKG:  EKG not ordered today.   Recent Labs: 07/27/2022: ALT 21; Hemoglobin 13.8; Platelets 279 01/12/2023: BUN 14; Creatinine, Ser 1.05; Potassium 3.7; Sodium 142  Recent Lipid Panel    Component Value Date/Time   CHOL 201 (H) 07/28/2022 0243   TRIG 215 (H) 07/28/2022 0243   HDL 45 07/28/2022 0243   CHOLHDL 4.5 07/28/2022 0243   VLDL 43 (H) 07/28/2022 0243   LDLCALC 113 (H) 07/28/2022 0243    Physical Exam:    Vital Signs: BP 130/72 (BP Location: Left Arm, Patient Position: Sitting, Cuff Size: Normal)   Pulse (!) 59   Ht 5\' 6"  (1.676 m)   Wt 197 lb 3.2 oz (89.4 kg)   SpO2 98%   BMI 31.83 kg/m     Wt Readings from Last 3 Encounters:  04/10/23 197 lb 3.2 oz (89.4 kg)  03/07/23 194 lb (88 kg)  12/23/22 205 lb 6.4 oz (93.2 kg)     General: 67 y.o. African-American female in no acute distress. HEENT:  Normocephalic and atraumatic. Sclera clear. EOMs intact. Neck: Supple. No JVD. Heart:  RRR. No murmurs, gallops, or rubs. Posterior tibial and distal pedal pulses 2+ and equal bilaterally. Lungs: No increased work of breathing. Mild rhonchi in bilateral bases. Otherwise, clear to auscultation.  Abdomen: Soft, non-distended, and non-tender to palpation.  Extremities: No lower extremity edema.    Skin: Warm and dry. Neuro: Alert and oriented x3. No focal deficits. Psych: Normal affect. Responds appropriately.   Assessment:    1. Numbness and tingling of both feet   2. Atypical chest pain   3. Primary hypertension   4. Hyperlipidemia, unspecified hyperlipidemia type   5. Type 2 diabetes mellitus with obesity (HCC)   6. Possible TIA   7. Tobacco abuse     Plan:    Numbness and Tinging of Bilateral Feet Patient reports numbness in tingling in bilateral feet as well as some mild leg heaviness. She was recently diagnosed with type 2 diabetes and started on Metformin. She is worried about the "circulation"  in her legs. She denies any claudication and has good posterior tibial and distal pedal pulses bilaterally. I have a very low suspicion for PAD (symptoms sound more suspicious for peripheral neuropathy) but will check ABIs.   Atypical Chest Pain Coronary CTA in 07/2022 showed a coronary calcium score of 0 with no evidence of CAD. Interestingly, recent chest CT in 02/2023 for routine screening of lung cancer showed coronary calcifications (cardiovascular section under the finding section only mention mild aortic atherosclerotic calcification but under the impression section of the report both coronary artery calcification and aortic atherosclerosis are mentioned).  - She reports some mild vague chest pain that occurs randomly and does not last long. Improves with reflux medications. - No additional work-up necessary. - Do not think symptoms are cardiac in nature. Possible GI related. Also  suspect stress/ anxiety are playing a role. She states she already has a Veterinary surgeon and will talk to them. No additional cardiac work-up necessary at this time.  Hypertension BP well controlled.  - Continue Olmesartan-HCTZ 40-25mg  daily.  Hyperlipidemia Lipid panel in 10/2022: Total Cholesterol 141, Triglycerides 125, HDL 44, LDL 75. LDL goal <70 given possible TIA. - Switched from Lipitor to Crestor 10mg  daily at last office visit in 12/2022 due to concerns that Lipitor may of been causing myalgias. Continue current dose of Crestor for now. - Labs followed by PCP.  Type 2 Diabetes Mellitus  Hemoglobin A1c 6.8 in 07/2022. - Recently started on Metformin. - Management per PCP.   Possible TIA She was admitted in 07/2022 for possible TIA vs stress reaction.  - Continue aspirin and statin. - Followed by Neurology.  Tobacco Abuse  She continues to smoke but is actively trying to quick. Congratulated patient on this and encouraged her to continue to work towards complete cessation.  Disposition: Follow up in 6 months.   Medication Adjustments/Labs and Tests Ordered: Current medicines are reviewed at length with the patient today.  Concerns regarding medicines are outlined above.  Orders Placed This Encounter  Procedures   VAS [place] ABI WITH/WO TBI   No orders of the defined types were placed in this encounter.   Patient Instructions  Medication Instructions:  NO MEDICATION CHANGES *If you need a refill on your cardiac medications before your next appointment, please call your pharmacy*   Lab Work: NO LABS ORDERED If you have labs (blood work) drawn today and your tests are completely normal, you will receive your results only by: MyChart Message (if you have MyChart) OR A paper copy in the mail If you have any lab test that is abnormal or we need to change your treatment, we will call you to review the results.   Testing/Procedures: Your physician has requested that you have an  ankle brachial index (ABI). During this test an ultrasound and blood pressure cuff are used to evaluate the arteries that supply the arms and legs with blood. Allow thirty minutes for this exam. There are no restrictions or special instructions.    Follow-Up: At [facility], you and your health needs are our priority.  As part of our continuing mission to provide you with exceptional heart care, we have created designated Provider Care Teams.  These Care Teams include your primary Cardiologist (physician) and Advanced Practice Providers (APPs -  Physician Assistants and Nurse Practitioners) who all work together to provide you with the care you need, when you need it.  We recommend signing up for the patient portal called "MyChart".  Sign up  information is provided on this After Visit Summary.  MyChart is used to connect with patients for Virtual Visits (Telemedicine).  Patients are able to view lab/test results, encounter notes, upcoming appointments, etc.  Non-urgent messages can be sent to your provider as well.   To learn more about what you can do with MyChart, go to [email or web address].    Your next appointment:   6 month(s)  Provider:   Marjie Skiff, PA-C        Other Instructions A LETTER WILL BE SENT TO YOU AS A REMINDER TO CALL THE OFFICE FOR YOUR 6 MONTH FOLLOW UP    Signed, Corrin Parker, PA-C  04/10/2023 11:46 AM    [facility]

## 2023-04-10 ENCOUNTER — Ambulatory Visit: Payer: Medicare HMO | Attending: Student | Admitting: Student

## 2023-04-10 ENCOUNTER — Encounter: Payer: Self-pay | Admitting: Student

## 2023-04-10 VITALS — BP 130/72 | HR 59 | Ht 66.0 in | Wt 197.2 lb

## 2023-04-10 DIAGNOSIS — R0789 Other chest pain: Secondary | ICD-10-CM | POA: Diagnosis not present

## 2023-04-10 DIAGNOSIS — E669 Obesity, unspecified: Secondary | ICD-10-CM | POA: Diagnosis not present

## 2023-04-10 DIAGNOSIS — R202 Paresthesia of skin: Secondary | ICD-10-CM | POA: Diagnosis not present

## 2023-04-10 DIAGNOSIS — Z7984 Long term (current) use of oral hypoglycemic drugs: Secondary | ICD-10-CM

## 2023-04-10 DIAGNOSIS — R200 Anesthesia of skin: Secondary | ICD-10-CM | POA: Diagnosis not present

## 2023-04-10 DIAGNOSIS — E785 Hyperlipidemia, unspecified: Secondary | ICD-10-CM | POA: Diagnosis not present

## 2023-04-10 DIAGNOSIS — I10 Essential (primary) hypertension: Secondary | ICD-10-CM | POA: Diagnosis not present

## 2023-04-10 DIAGNOSIS — Z720 Tobacco use: Secondary | ICD-10-CM | POA: Diagnosis not present

## 2023-04-10 DIAGNOSIS — E1169 Type 2 diabetes mellitus with other specified complication: Secondary | ICD-10-CM | POA: Diagnosis not present

## 2023-04-10 DIAGNOSIS — G459 Transient cerebral ischemic attack, unspecified: Secondary | ICD-10-CM | POA: Diagnosis not present

## 2023-04-10 NOTE — Patient Instructions (Signed)
Medication Instructions:  NO MEDICATION CHANGES *If you need a refill on your cardiac medications before your next appointment, please call your pharmacy*   Lab Work: NO LABS ORDERED If you have labs (blood work) drawn today and your tests are completely normal, you will receive your results only by: MyChart Message (if you have MyChart) OR A paper copy in the mail If you have any lab test that is abnormal or we need to change your treatment, we will call you to review the results.   Testing/Procedures: Your physician has requested that you have an ankle brachial index (ABI). During this test an ultrasound and blood pressure cuff are used to evaluate the arteries that supply the arms and legs with blood. Allow thirty minutes for this exam. There are no restrictions or special instructions.    Follow-Up: At [facility], you and your health needs are our priority.  As part of our continuing mission to provide you with exceptional heart care, we have created designated Provider Care Teams.  These Care Teams include your primary Cardiologist (physician) and Advanced Practice Providers (APPs -  Physician Assistants and Nurse Practitioners) who all work together to provide you with the care you need, when you need it.  We recommend signing up for the patient portal called "MyChart".  Sign up information is provided on this After Visit Summary.  MyChart is used to connect with patients for Virtual Visits (Telemedicine).  Patients are able to view lab/test results, encounter notes, upcoming appointments, etc.  Non-urgent messages can be sent to your provider as well.   To learn more about what you can do with MyChart, go to [email or web address].    Your next appointment:   6 month(s)  Provider:   Marjie Skiff, PA-C        Other Instructions A LETTER WILL BE SENT TO YOU AS A REMINDER TO CALL THE OFFICE FOR YOUR 6 MONTH FOLLOW UP

## 2023-04-11 ENCOUNTER — Encounter: Payer: Self-pay | Admitting: Dietician

## 2023-04-11 ENCOUNTER — Encounter: Payer: Medicare HMO | Attending: Family Medicine | Admitting: Dietician

## 2023-04-11 DIAGNOSIS — E1165 Type 2 diabetes mellitus with hyperglycemia: Secondary | ICD-10-CM | POA: Insufficient documentation

## 2023-04-11 NOTE — Progress Notes (Signed)
Patient was seen on 04/11/23 for the first of a series of three diabetes self-management courses at [facility].  Patient Education Plan per assessed needs and concerns is to attend three course education program for Diabetes Self Management Education.  A1C was 6.8 on 03/27/23.  The following learning objectives were met by the patient during this class: Describe diabetes, types of diabetes and pathophysiology State some common risk factors for diabetes Defines the role of glucose and insulin Describe the relationship between diabetes and cardiovascular and other risks State the members of the Healthcare Team States the rationale for glucose monitoring and when to test State their individual Target Range State the importance of logging glucose readings and how to interpret the readings Identifies A1C target Explain the correlation between A1c and eAG values State symptoms and treatment of high blood glucose and low blood glucose Explain proper technique for glucose testing and identify proper sharps disposal  Handouts given during class include: How to Thrive:  A Guide for Your Journey with Diabetes by the ADA Meal Plan Card and carbohydrate content list Dietary intake form Low Sodium Flavoring Tips Types of Fats Dining Out Label reading Snack list The diabetes portion plate Diabetes Resources A1c to eAG Conversion Chart Blood Glucose Log Diabetes Recommended Care Schedule Support Group Diabetes Success Plan Core Class Satisfaction Survey   Follow-Up Plan: Attend core 2

## 2023-04-17 DIAGNOSIS — I10 Essential (primary) hypertension: Secondary | ICD-10-CM | POA: Diagnosis not present

## 2023-04-17 DIAGNOSIS — M8588 Other specified disorders of bone density and structure, other site: Secondary | ICD-10-CM | POA: Diagnosis not present

## 2023-04-17 DIAGNOSIS — E1142 Type 2 diabetes mellitus with diabetic polyneuropathy: Secondary | ICD-10-CM | POA: Diagnosis not present

## 2023-04-17 DIAGNOSIS — F411 Generalized anxiety disorder: Secondary | ICD-10-CM | POA: Diagnosis not present

## 2023-04-17 DIAGNOSIS — I700 Atherosclerosis of aorta: Secondary | ICD-10-CM | POA: Diagnosis not present

## 2023-04-17 DIAGNOSIS — E785 Hyperlipidemia, unspecified: Secondary | ICD-10-CM | POA: Diagnosis not present

## 2023-04-17 DIAGNOSIS — J439 Emphysema, unspecified: Secondary | ICD-10-CM | POA: Diagnosis not present

## 2023-04-17 DIAGNOSIS — F17210 Nicotine dependence, cigarettes, uncomplicated: Secondary | ICD-10-CM | POA: Diagnosis not present

## 2023-04-17 DIAGNOSIS — M25551 Pain in right hip: Secondary | ICD-10-CM | POA: Diagnosis not present

## 2023-04-18 ENCOUNTER — Encounter: Payer: Medicare HMO | Attending: Family Medicine | Admitting: Dietician

## 2023-04-18 ENCOUNTER — Encounter: Payer: Self-pay | Admitting: Dietician

## 2023-04-18 DIAGNOSIS — E1165 Type 2 diabetes mellitus with hyperglycemia: Secondary | ICD-10-CM | POA: Insufficient documentation

## 2023-04-18 NOTE — Progress Notes (Signed)
Patient was seen on 04/18/23 for the second of a series of three diabetes self-management courses at the [facility]. The following learning objectives were met by the patient during this class:  Describe the role of different macronutrients on glucose Explain how carbohydrates affect blood glucose State what foods contain the most carbohydrates Demonstrate carbohydrate counting Demonstrate how to read Nutrition Facts food label Describe effects of various fats on heart health Describe the importance of good nutrition for health and healthy eating strategies Describe techniques for managing your shopping, cooking and meal planning List strategies to follow meal plan when dining out Describe the effects of alcohol on glucose and how to use it safely  Goals:  Follow Diabetes Meal Plan as instructed  Aim to spread carbs evenly throughout the day  Aim for 3 meals per day and snacks as needed Include lean protein foods to meals/snacks  Monitor glucose levels as instructed by your doctor   Follow-Up Plan: Attend Core 3 Work towards following your personal food plan.

## 2023-04-20 ENCOUNTER — Other Ambulatory Visit (HOSPITAL_COMMUNITY): Payer: Self-pay | Admitting: Student

## 2023-04-20 ENCOUNTER — Ambulatory Visit (HOSPITAL_COMMUNITY)
Admission: RE | Admit: 2023-04-20 | Discharge: 2023-04-20 | Disposition: A | Discharge status: Home / Self Care | Payer: Medicare HMO | Source: Ambulatory Visit | Attending: Student | Admitting: Student

## 2023-04-20 DIAGNOSIS — R202 Paresthesia of skin: Secondary | ICD-10-CM

## 2023-04-20 DIAGNOSIS — R200 Anesthesia of skin: Secondary | ICD-10-CM | POA: Diagnosis not present

## 2023-04-20 DIAGNOSIS — I739 Peripheral vascular disease, unspecified: Secondary | ICD-10-CM

## 2023-04-20 LAB — VAS US ABI WITH/WO TBI
Left ABI: 1.12
Right ABI: 1.04

## 2023-04-21 ENCOUNTER — Ambulatory Visit (HOSPITAL_COMMUNITY)
Admission: RE | Admit: 2023-04-21 | Discharge: 2023-04-21 | Disposition: A | Discharge status: Home / Self Care | Payer: Medicare HMO | Source: Ambulatory Visit | Attending: Cardiology | Admitting: Cardiology

## 2023-04-21 DIAGNOSIS — I739 Peripheral vascular disease, unspecified: Secondary | ICD-10-CM

## 2023-04-25 ENCOUNTER — Encounter: Payer: Self-pay | Admitting: Dietician

## 2023-04-25 ENCOUNTER — Encounter: Payer: Medicare HMO | Admitting: Dietician

## 2023-04-25 DIAGNOSIS — E1165 Type 2 diabetes mellitus with hyperglycemia: Secondary | ICD-10-CM | POA: Diagnosis not present

## 2023-04-25 NOTE — Progress Notes (Signed)
Patient was seen on 04/25/23 for the third of a series of three diabetes self-management courses at the [facility].   State the amount of activity recommended for healthy living Describe activities suitable for individual needs Identify ways to regularly incorporate activity into daily life Identify barriers to activity and ways to over come these barriers Identify diabetes medications being personally used and their primary action for lowering glucose and possible side effects Describe role of stress on blood glucose and develop strategies to address psychosocial issues Identify diabetes complications and ways to prevent them Explain how to manage diabetes during illness Evaluate success in meeting personal goal Establish 2-3 goals that they will plan to diligently work on  Goals:   I will be active 25 minutes or more 3 times a week I will take my diabetes medications as scheduled I will test my glucose at least 2 times a day, 7 days a week To help manage stress I will  exercise at least 3 times a week  Your patient has identified these potential barriers to change:  Motivation Finances Stress Lack of Family Support  Your patient has identified their diabetes self-care support plan as  Family Education officer, environmental Resources [facility] Support Group  American Diabetes Association Website    Plan:  Attend Support Group as desired

## 2023-05-02 DIAGNOSIS — F4322 Adjustment disorder with anxiety: Secondary | ICD-10-CM | POA: Diagnosis not present

## 2023-05-03 DIAGNOSIS — M722 Plantar fascial fibromatosis: Secondary | ICD-10-CM | POA: Diagnosis not present

## 2023-05-03 DIAGNOSIS — M7062 Trochanteric bursitis, left hip: Secondary | ICD-10-CM | POA: Diagnosis not present

## 2023-06-12 ENCOUNTER — Encounter: Payer: Self-pay | Admitting: Cardiovascular Disease

## 2023-06-12 ENCOUNTER — Ambulatory Visit: Payer: Medicare HMO | Attending: Cardiovascular Disease | Admitting: Cardiovascular Disease

## 2023-06-12 VITALS — BP 122/76 | HR 80 | Ht 67.0 in | Wt 193.0 lb

## 2023-06-12 DIAGNOSIS — E782 Mixed hyperlipidemia: Secondary | ICD-10-CM

## 2023-06-12 DIAGNOSIS — Z720 Tobacco use: Secondary | ICD-10-CM | POA: Diagnosis not present

## 2023-06-12 DIAGNOSIS — I10 Essential (primary) hypertension: Secondary | ICD-10-CM | POA: Diagnosis not present

## 2023-06-12 NOTE — Assessment & Plan Note (Signed)
History of essential hypertension with blood pressure measured today at 122/76.  She was on amlodipine in the past which she did not tolerate.  She is currently on Benicar, hydrochlorothiazide under good control.

## 2023-06-12 NOTE — Assessment & Plan Note (Signed)
History of hyperlipidemia on rosuvastatin with lipid profile performed 10/12/2022 revealing a total cholesterol of 141, LDL 75 and HDL 44.

## 2023-06-12 NOTE — Progress Notes (Signed)
06/12/2023 Tracey Buchanan   03/08/1956  [id number]  Primary Physician Aliene Beams, MD Primary Cardiologist: Runell Gess MD FACP, [place], [place], [place]  HPI:  Tracey Buchanan is a 67 y.o.   mild to moderately overweight single African-American female no children who was self-referred for evaluation of chest pain.  I last saw her in the office 06/24/2022.  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 Risk factors include continued tobacco abuse of one third of a pack a day for the last 40 years as well as history of hypertension. Her mother did have a CABG. She has never had a heart attack or stroke. She's had chest pain for last 6 weeks which is somewhat atypical. Mostly occurs at night or in the morning.  As result of her chest pain I did get a Myoview stress test on her 09/08/2017 which was entirely normal.   She saw her OB/GYN and mentioned this chest pain and was referred here for further evaluation.  She gets the pain several times a week which usually awakens her from sleep, begins in her back and radiates to her chest lasting up to 10 minutes at a time associate with shortness of breath.  It usually does not occur during the daylight hours or while she is performing activities.  She had a coronary calcium score performed 08/03/2022 that was 0.  2D echo revealed normal LV systolic function with grade 1 diastolic dysfunction.  She had lower extremity arterial Doppler studies that revealed normal ABIs bilaterally with some tibial vessel disease of no consequence.  She did have a TIA 11/23 and presented to [facility] where her workup was unrevealing.   Current Meds  Medication Sig   aspirin EC 81 MG tablet Take 1 tablet (81 mg total) by mouth daily. Swallow whole.   FLUoxetine (PROZAC) 20 MG capsule Take 1 capsule (20 mg total) by mouth  daily.   ibuprofen (ADVIL) 200 MG tablet Take 400 mg by mouth 2 (two) times daily as needed for headache or mild pain.   LORazepam (ATIVAN) 0.5 MG tablet Take 0.5 mg by mouth 2 (two) times daily as needed for anxiety.   metformin (FORTAMET) 1000 MG (OSM) 24 hr tablet Take 1,000 mg by mouth daily with breakfast.   olmesartan-hydrochlorothiazide (BENICAR HCT) 40-25 MG tablet Take 1 tablet by mouth daily.   rosuvastatin (CRESTOR) 10 MG tablet Take 1 tablet (10 mg total) by mouth daily.   triamcinolone cream (KENALOG) 0.1 % Apply 1 Application topically 2 (two) times daily as needed (irritation).     Allergies  Allergen Reactions   Amlodipine Besylate     Muscle pain, leg weakness, heavy feeling in legs    Cymbalta [Duloxetine Hcl] Nausea Only   Effexor [Venlafaxine] Other (See Comments)    Nightmares    Flexeril [Cyclobenzaprine] Other (See Comments)    Urinary frequency   Prevacid [Lansoprazole] Other (See Comments)    Lack of therapeutic effect   Prilosec [Omeprazole] Other (See Comments)    Lack of therapeutic effect   Wellbutrin Xl [Bupropion] Other (See Comments)    Nightmares    Codeine Palpitations    Social History   Socioeconomic History   Marital status: Single    Spouse name: Not on file   Number of children: Not on file  Years of education: Not on file   Highest education level: Not on file  Occupational History   Not on file  Tobacco Use   Smoking status: Every Day    Current packs/day: 0.50    Types: Cigarettes   Smokeless tobacco: Never  Vaping Use   Vaping status: Never Used  Substance and Sexual Activity   Alcohol use: Yes    Alcohol/week: 7.0 standard drinks of alcohol    Types: 7 Glasses of wine per week    Comment: takes 2 shots every other day   Drug use: No   Sexual activity: Not Currently    Birth control/protection: Surgical    Comment: 1st intercourse 19 yo-5 partners  Other Topics Concern   Not on file  Social History Narrative   Not on  file   Social Determinants of Health   Financial Resource Strain: Not on file  Food Insecurity: Not on file  Transportation Needs: Not on file  Physical Activity: Not on file  Stress: Not on file  Social Connections: Not on file  Intimate Partner Violence: Not on file     Review of Systems: General: negative for chills, fever, night sweats or weight changes.  Cardiovascular: negative for chest pain, dyspnea on exertion, edema, orthopnea, palpitations, paroxysmal nocturnal dyspnea or shortness of breath Dermatological: negative for rash Respiratory: negative for cough or wheezing Urologic: negative for hematuria Abdominal: negative for nausea, vomiting, diarrhea, bright red blood per rectum, melena, or hematemesis Neurologic: negative for visual changes, syncope, or dizziness All other systems reviewed and are otherwise negative except as noted above.    Blood pressure 122/76, pulse 80, height 5\' 7"  (1.702 m), weight 193 lb (87.5 kg), SpO2 98%.  General appearance: alert and no distress Neck: no adenopathy, no carotid bruit, no JVD, supple, symmetrical, trachea midline, and thyroid not enlarged, symmetric, no tenderness/mass/nodules Lungs: clear to auscultation bilaterally Heart: regular rate and rhythm, S1, S2 normal, no murmur, click, rub or gallop Extremities: extremities normal, atraumatic, no cyanosis or edema Pulses: 2+ and symmetric Skin: Skin color, texture, turgor normal. No rashes or lesions Neurologic: Grossly normal  EKG EKG Interpretation Date/Time:  Monday June 12 2023 08:27:04 EDT Ventricular Rate:  80 PR Interval:  122 QRS Duration:  86 QT Interval:  392 QTC Calculation: 452 R Axis:   36  Text Interpretation: Normal sinus rhythm Inferior infarct , age undetermined When compared with ECG of 27-Jul-2022 10:16, PREVIOUS ECG IS PRESENT Confirmed by Nanetta Batty [phone]) on 06/12/2023 8:41:57 AM    ASSESSMENT AND PLAN:   Hypertension History of  essential hypertension with blood pressure measured today at 122/76.  She was on amlodipine in the past which she did not tolerate.  She is currently on Benicar, hydrochlorothiazide under good control.  Hyperlipidemia History of hyperlipidemia on rosuvastatin with lipid profile performed 10/12/2022 revealing a total cholesterol of 141, LDL 75 and HDL 44.     Runell Gess MD FACP,FACC,FAHA, [facility] 06/12/2023 8:57 AM

## 2023-06-12 NOTE — Patient Instructions (Signed)
Medication Instructions:  Your physician recommends that you continue on your current medications as directed. Please refer to the Current Medication list given to you today.  *If you need a refill on your cardiac medications before your next appointment, please call your pharmacy*   Follow-Up: At [facility], you and your health needs are our priority.  As part of our continuing mission to provide you with exceptional heart care, we have created designated Provider Care Teams.  These Care Teams include your primary Cardiologist (physician) and Advanced Practice Providers (APPs -  Physician Assistants and Nurse Practitioners) who all work together to provide you with the care you need, when you need it.  We recommend signing up for the patient portal called "MyChart".  Sign up information is provided on this After Visit Summary.  MyChart is used to connect with patients for Virtual Visits (Telemedicine).  Patients are able to view lab/test results, encounter notes, upcoming appointments, etc.  Non-urgent messages can be sent to your provider as well.   To learn more about what you can do with MyChart, go to [email or web address].    Your next appointment:   12 month(s)  Provider:   Bernadene Person, NP       Then, Nanetta Batty, MD will plan to see you again in 2 year(s).

## 2023-06-14 ENCOUNTER — Telehealth: Payer: Self-pay

## 2023-06-14 DIAGNOSIS — Z0000 Encounter for general adult medical examination without abnormal findings: Secondary | ICD-10-CM

## 2023-06-14 NOTE — Telephone Encounter (Signed)
Called patient to schedule initial health coaching session. Patient was not available to talk at the time and requested a call back the afternoon of 10/10. Patient will be called at this time.    Renaee Munda, MS, ERHD, [facility]  Care Guide, Health & Wellness Coach [address], [address] [place] [address] Telephone: [phone] Email: [email or web address]

## 2023-06-20 ENCOUNTER — Telehealth: Payer: Self-pay

## 2023-06-20 DIAGNOSIS — R519 Headache, unspecified: Secondary | ICD-10-CM | POA: Diagnosis not present

## 2023-06-20 DIAGNOSIS — R0981 Nasal congestion: Secondary | ICD-10-CM | POA: Diagnosis not present

## 2023-06-20 DIAGNOSIS — Z0000 Encounter for general adult medical examination without abnormal findings: Secondary | ICD-10-CM

## 2023-06-20 DIAGNOSIS — R61 Generalized hyperhidrosis: Secondary | ICD-10-CM | POA: Diagnosis not present

## 2023-06-20 DIAGNOSIS — J3489 Other specified disorders of nose and nasal sinuses: Secondary | ICD-10-CM | POA: Diagnosis not present

## 2023-06-20 DIAGNOSIS — R5381 Other malaise: Secondary | ICD-10-CM | POA: Diagnosis not present

## 2023-06-20 DIAGNOSIS — B349 Viral infection, unspecified: Secondary | ICD-10-CM | POA: Diagnosis not present

## 2023-06-20 DIAGNOSIS — R0982 Postnasal drip: Secondary | ICD-10-CM | POA: Diagnosis not present

## 2023-06-20 DIAGNOSIS — R5383 Other fatigue: Secondary | ICD-10-CM | POA: Diagnosis not present

## 2023-06-20 DIAGNOSIS — R6883 Chills (without fever): Secondary | ICD-10-CM | POA: Diagnosis not present

## 2023-06-20 NOTE — Telephone Encounter (Signed)
Called patient to schedule initial health coaching session for smoking cessation. Patient stated that she would call back and was provided my direct telephone number.    Renaee Munda, MS, ERHD, [facility]  Care Guide, Health & Wellness Coach [address] [place] [address] Telephone: [phone] Email: [email or web address]

## 2023-06-26 DIAGNOSIS — F4322 Adjustment disorder with anxiety: Secondary | ICD-10-CM | POA: Diagnosis not present

## 2023-07-10 ENCOUNTER — Ambulatory Visit: Payer: Medicare HMO | Admitting: Cardiovascular Disease

## 2023-07-13 DIAGNOSIS — F341 Dysthymic disorder: Secondary | ICD-10-CM | POA: Diagnosis not present

## 2023-07-17 ENCOUNTER — Encounter: Payer: Self-pay | Admitting: Dietician

## 2023-07-17 ENCOUNTER — Encounter: Payer: Medicare HMO | Attending: Family Medicine | Admitting: Dietician

## 2023-07-17 DIAGNOSIS — E1165 Type 2 diabetes mellitus with hyperglycemia: Secondary | ICD-10-CM | POA: Insufficient documentation

## 2023-07-17 NOTE — Patient Instructions (Addendum)
Consider getting your Vitamin D checked. Continue to be mindful about your food and beverage choices  FREE Telephonic Coaching:  Go to Quitline[place] , Call 1-800-QUIT-NOW ((403)642-4855); or Text: Ready to 34191. Quitline [place] has teamed up with Medicaid to offer a free 14-week program Quit Smoking Apps; [place], QuitGuide Online education and resources; Smokefree Support to Quit Vaping: Want to Quit? Free 24/7 support- Call 1-800-QUIT-NOW Concerned about a teen who vapes? Live Vape Free- A quit vaping program for [place] teens aged 68-17. To get started, text VAPEFREENC to 551-213-5861. Nicotine Replacement Therapy- Available for purchase at any [facility].

## 2023-07-17 NOTE — Progress Notes (Signed)
Appointment start:  1200 Appointment End:  1245  Patient attended Diabetes Core Classes 1-3 between 04/11/2023 and 04/25/2023 at [facility]. The purpose of the meeting today is to review information learned during those classes as well as review patient application and goals.   What are one or two positive things that you are doing right now to manage your diabetes?  Trying to exercise more, stopped eating in her bedroom, working on quitting smoking, decreased salt intake.  What is the hardest part about your diabetes right now, causing you the most concern, or is the most worrisome to you about your diabetes?  My blood glucose increasing  What questions do you have today?  No smoking resources  Have you participated in any diabetes support group?  none  History:  Type 2 Diabetes, HTN, CVA, TIA, smokes, vitamin D deficiency   A1C:  A1C 6.8% 03/27/23 and 07/28/2022 Medications include:  Metformin (fortamet) Sleep:  poor, used to have to get up with her mother Weight:  198 lbs Blood Glucose:  Fasting:  89-109  2 hours after starting a meal:  occasional  Have you had any low blood sugar readings in the past month?  none  Social History:  lives alone - retired from the [place] [employer] [place], mother passed summer of 2024, cooks for herself, eats simply and eats out frequently Exercise:  uses a peddler 5 days per week for 30 three times daily , stretches, walks most days with his neighbor  24 hour diet recall: Breakfast:  bacon, sausage, 1/4 slice bread  Snack:   Lunch:  spinach omelet with grits Snack:   Dinner:  spaghetti with meat sauce, salad ([place]), thin slice lava cake or asparagus, potato, 1/2 steak Snack:  stopped except rare dark chocolate or 1/2 small bag chips Beverages:  water, coffee with 1/2 tsp sugar, pom juice, 8 ounce cans regular gingerale   Specific focus but not limited to the following: Review of blood glucose monitoring and  interpretation including the recommended target ranges and Hgb A1c.  Review of carbohydrate counting, importance of regularly scheduled meals/snacks, and meal planning to improve quality of diet. Review of the effects of physical activity on glucose levels and long-term glucose control.  Recommended goal of 150 minutes of physical activity/week. Review of patient medications and discussed role of medication on blood glucose and possible side effects. Discussion of strategies to manage stress, psychosocial issues, and other obstacles to diabetes management. Review of short-term complications: hyper- and hypo-glycemia (causes, symptoms, and treatment options) Review of prevention, detection, and treatment of long-term complications.  Discussion of the role of prolonged elevated glucose levels on body systems.  Continuing Goals: Consider getting your Vitamin D checked. Continue to be mindful about your food and beverage choices  FREE Telephonic Coaching:  Go to Quitline[place] , Call 1-800-QUIT-NOW (4141654449); or Text: Ready to 34191. Quitline [place] has teamed up with Medicaid to offer a free 14-week program Quit Smoking Apps; [place], QuitGuide Online education and resources; Smokefree Support to Quit Vaping: Want to Quit? Free 24/7 support- Call 1-800-QUIT-NOW Concerned about a teen who vapes? Live Vape Free- A quit vaping program for [place] teens aged 25-17. To get started, text VAPEFREENC to (714)624-5728. Nicotine Replacement Therapy- Available for purchase at any [facility].  Future Follow up:  prn

## 2023-07-18 DIAGNOSIS — I10 Essential (primary) hypertension: Secondary | ICD-10-CM | POA: Diagnosis not present

## 2023-07-18 DIAGNOSIS — M79671 Pain in right foot: Secondary | ICD-10-CM | POA: Diagnosis not present

## 2023-07-18 DIAGNOSIS — F411 Generalized anxiety disorder: Secondary | ICD-10-CM | POA: Diagnosis not present

## 2023-07-18 DIAGNOSIS — E118 Type 2 diabetes mellitus with unspecified complications: Secondary | ICD-10-CM | POA: Diagnosis not present

## 2023-07-18 DIAGNOSIS — I700 Atherosclerosis of aorta: Secondary | ICD-10-CM | POA: Diagnosis not present

## 2023-07-18 DIAGNOSIS — Z23 Encounter for immunization: Secondary | ICD-10-CM | POA: Diagnosis not present

## 2023-07-18 DIAGNOSIS — F17200 Nicotine dependence, unspecified, uncomplicated: Secondary | ICD-10-CM | POA: Diagnosis not present

## 2023-07-18 DIAGNOSIS — E785 Hyperlipidemia, unspecified: Secondary | ICD-10-CM | POA: Diagnosis not present

## 2023-07-18 DIAGNOSIS — E559 Vitamin D deficiency, unspecified: Secondary | ICD-10-CM | POA: Diagnosis not present

## 2023-07-26 DIAGNOSIS — F341 Dysthymic disorder: Secondary | ICD-10-CM | POA: Diagnosis not present

## 2023-08-14 ENCOUNTER — Encounter: Payer: Self-pay | Admitting: Acute Care

## 2023-08-15 DIAGNOSIS — F341 Dysthymic disorder: Secondary | ICD-10-CM | POA: Diagnosis not present

## 2023-09-05 ENCOUNTER — Ambulatory Visit (HOSPITAL_COMMUNITY)
Admission: RE | Admit: 2023-09-05 | Discharge: 2023-09-05 | Disposition: A | Discharge status: Home / Self Care | Payer: Medicare HMO | Source: Ambulatory Visit | Attending: Vascular Surgery | Admitting: Vascular Surgery

## 2023-09-05 ENCOUNTER — Encounter (HOSPITAL_COMMUNITY): Payer: Medicare HMO

## 2023-09-05 ENCOUNTER — Other Ambulatory Visit (HOSPITAL_COMMUNITY): Payer: Self-pay | Admitting: Family Medicine

## 2023-09-05 DIAGNOSIS — M79605 Pain in left leg: Secondary | ICD-10-CM | POA: Insufficient documentation

## 2023-09-07 ENCOUNTER — Ambulatory Visit
Admission: RE | Admit: 2023-09-07 | Discharge: 2023-09-07 | Disposition: A | Discharge status: Home / Self Care | Payer: Medicare HMO | Source: Ambulatory Visit | Attending: Family Medicine | Admitting: Family Medicine

## 2023-09-07 DIAGNOSIS — J439 Emphysema, unspecified: Secondary | ICD-10-CM | POA: Diagnosis not present

## 2023-09-07 DIAGNOSIS — I700 Atherosclerosis of aorta: Secondary | ICD-10-CM | POA: Diagnosis not present

## 2023-09-07 DIAGNOSIS — R911 Solitary pulmonary nodule: Secondary | ICD-10-CM

## 2023-09-07 DIAGNOSIS — F17210 Nicotine dependence, cigarettes, uncomplicated: Secondary | ICD-10-CM

## 2023-09-07 DIAGNOSIS — R918 Other nonspecific abnormal finding of lung field: Secondary | ICD-10-CM | POA: Diagnosis not present

## 2023-09-12 DIAGNOSIS — F341 Dysthymic disorder: Secondary | ICD-10-CM | POA: Diagnosis not present

## 2023-09-18 ENCOUNTER — Telehealth: Payer: Self-pay | Admitting: Acute Care

## 2023-09-18 NOTE — Telephone Encounter (Signed)
 Spoke with patient. Advised there are no final results posted at this time. Advised staff will contact her once received. No additional questions.

## 2023-09-18 NOTE — Telephone Encounter (Signed)
 Patient would like results of CT scan. Patient phone number is [phone].

## 2023-09-19 ENCOUNTER — Other Ambulatory Visit: Payer: Self-pay

## 2023-09-19 ENCOUNTER — Telehealth: Payer: Self-pay

## 2023-09-19 DIAGNOSIS — F17210 Nicotine dependence, cigarettes, uncomplicated: Secondary | ICD-10-CM

## 2023-09-19 DIAGNOSIS — Z122 Encounter for screening for malignant neoplasm of respiratory organs: Secondary | ICD-10-CM

## 2023-09-19 DIAGNOSIS — Z87891 Personal history of nicotine dependence: Secondary | ICD-10-CM

## 2023-09-19 DIAGNOSIS — R911 Solitary pulmonary nodule: Secondary | ICD-10-CM

## 2023-09-19 NOTE — Telephone Encounter (Signed)
 Spoke with patient and advised we will order CT for 6 months. Advised insurance may not pay and patient is aware. Patient is aware she will be called in 5 months to schedule. Patient advises she will call insurance at that time.

## 2023-09-19 NOTE — Telephone Encounter (Signed)
 Spoke with patient and reviewed recent CT results. Advised radiology recommends a 12 month f/u. Patient is requesting a 6 month follow up scan since the 14.40mm nodule is still present. Advised patient insurance may not pay for a sooner scan. Patient would like for us  to order it anyway and talk with insurance about a 6 month scan. Are you okay with me ordering a 6 month scan?

## 2023-09-27 DIAGNOSIS — F341 Dysthymic disorder: Secondary | ICD-10-CM | POA: Diagnosis not present

## 2023-09-28 ENCOUNTER — Other Ambulatory Visit: Payer: Self-pay | Admitting: Obstetrics and Gynecology

## 2023-09-28 DIAGNOSIS — Z1231 Encounter for screening mammogram for malignant neoplasm of breast: Secondary | ICD-10-CM

## 2023-09-29 ENCOUNTER — Ambulatory Visit
Admission: RE | Admit: 2023-09-29 | Discharge: 2023-09-29 | Disposition: A | Discharge status: Home / Self Care | Payer: Medicare HMO | Source: Ambulatory Visit | Attending: Obstetrics and Gynecology | Admitting: Obstetrics and Gynecology

## 2023-09-29 DIAGNOSIS — Z1231 Encounter for screening mammogram for malignant neoplasm of breast: Secondary | ICD-10-CM

## 2023-10-02 ENCOUNTER — Encounter: Payer: Self-pay | Admitting: Obstetrics and Gynecology

## 2023-10-03 NOTE — Progress Notes (Deleted)
 GYNECOLOGY  VISIT   HPI: 68 y.o.   Single  African American female   G2P0020 with No LMP recorded. Patient has had a hysterectomy.   here for: R side abdominal pain     GYNECOLOGIC HISTORY: No LMP recorded. Patient has had a hysterectomy. Contraception:  hyst Menopausal hormone therapy:  n/a Last 2 paps:  12/19/19 neg, 08/09/17 neg History of abnormal Pap or positive HPV:  yes, VAIN 09/2010 Mammogram:  09/29/23 Breast Density Cat B, BI-RADS CAT 1 neg        OB History     Gravida  2   Para      Term      Preterm      AB  2   Living  0      SAB      IAB      Ectopic      Multiple      Live Births                 Patient Active Problem List   Diagnosis Date Noted   TIA (transient ischemic attack) 07/28/2022   Hyperlipidemia 06/28/2022   Anxiety    Hypertension    DVT (deep venous thrombosis) (HCC)    Osteopenia    Hydrosalpinx     Past Medical History:  Diagnosis Date   Anxiety    ASCUS (atypical squamous cells of undetermined significance) on Pap smear 11/2011   negative high risk HPV   Diabetes mellitus without complication (HCC)    DVT (deep venous thrombosis) (HCC)    on birth control pills as a teenager   Hydrosalpinx    Hypertension    Osteopenia 09/2017   T score -2.0 FRAX 4.5% / 0.9%   TIA (transient ischemic attack) 07/2022   Urinary incontinence    VAIN I (vaginal intraepithelial neoplasia grade I) 11/2010   negative colposcopy   Vitamin D deficiency 06/2015   value 13 recommended 50,000 units weekly 12 with recheck of vitamin D level    Past Surgical History:  Procedure Laterality Date   ABDOMINAL HYSTERECTOMY  2001   Leiomyomata   CARDIAC CATHETERIZATION     MYOMECTOMY ABDOMINAL APPROACH  1996   PELVIC LAPAROSCOPY      Current Outpatient Medications  Medication Sig Dispense Refill   aspirin EC 81 MG tablet Take 1 tablet (81 mg total) by mouth daily. Swallow whole. 30 tablet 12   FLUoxetine (PROZAC) 20 MG capsule Take  1 capsule (20 mg total) by mouth daily. 30 capsule 12   ibuprofen (ADVIL) 200 MG tablet Take 400 mg by mouth 2 (two) times daily as needed for headache or mild pain.     LORazepam (ATIVAN) 0.5 MG tablet Take 0.5 mg by mouth 2 (two) times daily as needed for anxiety.     metformin (FORTAMET) 1000 MG (OSM) 24 hr tablet Take 1,000 mg by mouth daily with breakfast.     olmesartan-hydrochlorothiazide (BENICAR HCT) 40-25 MG tablet Take 1 tablet by mouth daily. 90 tablet 3   omeprazole (PRILOSEC) 40 MG capsule Take 1 capsule (40 mg total) by mouth daily. 30 capsule 0   rosuvastatin (CRESTOR) 10 MG tablet Take 1 tablet (10 mg total) by mouth daily. 90 tablet 3   triamcinolone cream (KENALOG) 0.1 % Apply 1 Application topically 2 (two) times daily as needed (irritation).     No current facility-administered medications for this visit.     ALLERGIES: Amlodipine besylate, Cymbalta [duloxetine hcl], Effexor [  venlafaxine], Flexeril [cyclobenzaprine], Prevacid [lansoprazole], Prilosec [omeprazole], Wellbutrin xl [bupropion], and Codeine  Family History  Problem Relation Age of Onset   Diabetes Mother    Heart disease Mother    Diabetes Father    Hypertension Father    Diabetes Brother    Hypertension Brother    Sarcoidosis Brother    Sarcoidosis Brother    Diabetes Brother     Social History   Socioeconomic History   Marital status: Single    Spouse name: Not on file   Number of children: Not on file   Years of education: Not on file   Highest education level: Not on file  Occupational History   Not on file  Tobacco Use   Smoking status: Every Day    Current packs/day: 0.50    Types: Cigarettes   Smokeless tobacco: Never  Vaping Use   Vaping status: Never Used  Substance and Sexual Activity   Alcohol use: Yes    Alcohol/week: 7.0 standard drinks of alcohol    Types: 7 Glasses of wine per week    Comment: takes 2 shots every other day   Drug use: No   Sexual activity: Not Currently     Birth control/protection: Surgical    Comment: 1st intercourse 19 yo-5 partners  Other Topics Concern   Not on file  Social History Narrative   Not on file   Social Drivers of Health   Financial Resource Strain: Not on file  Food Insecurity: Not on file  Transportation Needs: Not on file  Physical Activity: Not on file  Stress: Not on file  Social Connections: Not on file  Intimate Partner Violence: Not on file    Review of Systems  PHYSICAL EXAMINATION:   There were no vitals taken for this visit.    General appearance: alert, cooperative and appears stated age Head: Normocephalic, without obvious abnormality, atraumatic Neck: no adenopathy, supple, symmetrical, trachea midline and thyroid normal to inspection and palpation Lungs: clear to auscultation bilaterally Breasts: normal appearance, no masses or tenderness, No nipple retraction or dimpling, No nipple discharge or bleeding, No axillary or supraclavicular adenopathy Heart: regular rate and rhythm Abdomen: soft, non-tender, no masses,  no organomegaly Extremities: extremities normal, atraumatic, no cyanosis or edema Skin: Skin color, texture, turgor normal. No rashes or lesions Lymph nodes: Cervical, supraclavicular, and axillary nodes normal. No abnormal inguinal nodes palpated Neurologic: Grossly normal  Pelvic: External genitalia:  no lesions              Urethra:  normal appearing urethra with no masses, tenderness or lesions              Bartholins and Skenes: normal                 Vagina: normal appearing vagina with normal color and discharge, no lesions              Cervix: no lesions                Bimanual Exam:  Uterus:  normal size, contour, position, consistency, mobility, non-tender              Adnexa: no mass, fullness, tenderness              Rectal exam: {yes no:314532}.  Confirms.              Anus:  normal sphincter tone, no lesions  Chaperone was present for exam:   {BSCHAPERONE:31226::"Reyce Lubeck F, CMA"}  ASSESSMENT:    PLAN:    {LABS (Optional):23779}  ***  total time was spent for this patient encounter, including preparation, face-to-face counseling with the patient, coordination of care, and documentation of the encounter.

## 2023-10-04 ENCOUNTER — Ambulatory Visit: Payer: Medicare HMO | Admitting: Obstetrics and Gynecology

## 2023-10-09 ENCOUNTER — Encounter (HOSPITAL_COMMUNITY): Payer: Medicare HMO

## 2023-10-11 DIAGNOSIS — F341 Dysthymic disorder: Secondary | ICD-10-CM | POA: Diagnosis not present

## 2023-10-17 ENCOUNTER — Ambulatory Visit: Payer: Medicare HMO | Admitting: Obstetrics and Gynecology

## 2023-10-24 ENCOUNTER — Other Ambulatory Visit: Payer: Self-pay | Admitting: Nurse Practitioner

## 2023-11-02 ENCOUNTER — Other Ambulatory Visit: Payer: Self-pay | Admitting: Family Medicine

## 2023-11-02 DIAGNOSIS — E1169 Type 2 diabetes mellitus with other specified complication: Secondary | ICD-10-CM | POA: Diagnosis not present

## 2023-11-02 DIAGNOSIS — Z0000 Encounter for general adult medical examination without abnormal findings: Secondary | ICD-10-CM | POA: Diagnosis not present

## 2023-11-02 DIAGNOSIS — I10 Essential (primary) hypertension: Secondary | ICD-10-CM | POA: Diagnosis not present

## 2023-11-02 DIAGNOSIS — E2839 Other primary ovarian failure: Secondary | ICD-10-CM

## 2023-11-02 DIAGNOSIS — Z6832 Body mass index (BMI) 32.0-32.9, adult: Secondary | ICD-10-CM | POA: Diagnosis not present

## 2023-11-02 DIAGNOSIS — E66811 Obesity, class 1: Secondary | ICD-10-CM | POA: Diagnosis not present

## 2023-11-02 DIAGNOSIS — G2581 Restless legs syndrome: Secondary | ICD-10-CM | POA: Diagnosis not present

## 2023-11-02 DIAGNOSIS — Z79899 Other long term (current) drug therapy: Secondary | ICD-10-CM | POA: Diagnosis not present

## 2023-11-02 DIAGNOSIS — E785 Hyperlipidemia, unspecified: Secondary | ICD-10-CM | POA: Diagnosis not present

## 2023-11-27 ENCOUNTER — Ambulatory Visit: Admitting: Podiatry

## 2023-11-27 ENCOUNTER — Encounter: Payer: Self-pay | Admitting: Podiatry

## 2023-11-27 DIAGNOSIS — B351 Tinea unguium: Secondary | ICD-10-CM

## 2023-11-27 DIAGNOSIS — E1142 Type 2 diabetes mellitus with diabetic polyneuropathy: Secondary | ICD-10-CM | POA: Diagnosis not present

## 2023-11-27 DIAGNOSIS — M79674 Pain in right toe(s): Secondary | ICD-10-CM

## 2023-11-27 DIAGNOSIS — M79675 Pain in left toe(s): Secondary | ICD-10-CM | POA: Diagnosis not present

## 2023-11-27 NOTE — Progress Notes (Signed)
  Subjective:  Patient ID: Tracey Buchanan, female    DOB: 09-03-1956,   MRN: [id number]  No chief complaint on file.   68 y.o. female presents for diabetic foot check. Relates pains and numbness in her toes and fingers. Also  concern of thickened elongated and painful nails that are difficult to trim. Requesting to have them trimmed today. Relates burning and tingling in their feet. Patient is diabetic and last A1c was  Lab Results  Component Value Date   HGBA1C 6.8 (H) 07/28/2022   .   PCP:  Aliene Beams, MD    . Denies any other pedal complaints. Denies n/v/f/c.   Past Medical History:  Diagnosis Date   Anxiety    ASCUS (atypical squamous cells of undetermined significance) on Pap smear 11/2011   negative high risk HPV   Diabetes mellitus without complication (HCC)    DVT (deep venous thrombosis) (HCC)    on birth control pills as a teenager   Hydrosalpinx    Hypertension    Osteopenia 09/2017   T score -2.0 FRAX 4.5% / 0.9%   TIA (transient ischemic attack) 07/2022   Urinary incontinence    VAIN I (vaginal intraepithelial neoplasia grade I) 11/2010   negative colposcopy   Vitamin D deficiency 06/2015   value 13 recommended 50,000 units weekly 12 with recheck of vitamin D level    Objective:  Physical Exam: Vascular: DP/PT pulses 2/4 bilateral. CFT <3 seconds. Absent hair growth on digits. No edema noted to bilateral lower extremities. Xerosis noted bilaterally.  Skin. No lacerations or abrasions bilateral feet. Nails 1-5 bilateral  are thickened discolored and elongated with subungual debris.  Musculoskeletal: MMT 5/5 bilateral lower extremities in DF, PF, Inversion and Eversion. Deceased ROM in DF of ankle joint.  Neurological: Sensation intact to light touch. Protective sensation intact bilateral.    Assessment:   1. Type 2 diabetes mellitus with peripheral neuropathy (HCC)   2. Pain due to onychomycosis of toenails of both feet      Plan:  Patient was  evaluated and treated and all questions answered. -Discussed and educated patient on diabetic foot care, especially with  regards to the vascular, neurological and musculoskeletal systems.  -Stressed the importance of good glycemic control and the detriment of not  controlling glucose levels in relation to the foot. -Discussed supportive shoes at all times and checking feet regularly.  -Mechanically debrided all nails 1-5 bilateral using sterile nail nipper and filed with dremel without incident  -Answered all patient questions -Patient to return  in 1 year for DM foot check.  -Patient advised to call the office if any problems or questions arise in the meantime.   Louann Sjogren, DPM

## 2023-12-04 DIAGNOSIS — F332 Major depressive disorder, recurrent severe without psychotic features: Secondary | ICD-10-CM | POA: Diagnosis not present

## 2023-12-04 DIAGNOSIS — F411 Generalized anxiety disorder: Secondary | ICD-10-CM | POA: Diagnosis not present

## 2023-12-20 ENCOUNTER — Other Ambulatory Visit: Payer: Self-pay | Admitting: Nurse Practitioner

## 2023-12-27 DIAGNOSIS — F411 Generalized anxiety disorder: Secondary | ICD-10-CM | POA: Diagnosis not present

## 2023-12-27 DIAGNOSIS — F332 Major depressive disorder, recurrent severe without psychotic features: Secondary | ICD-10-CM | POA: Diagnosis not present

## 2023-12-29 ENCOUNTER — Encounter: Payer: Self-pay | Admitting: Obstetrics and Gynecology

## 2023-12-29 NOTE — Progress Notes (Signed)
 68 y.o. G1P0020 Single African American female here for a breast and pelvic exam. Pt reports she was recently dx with DM and recently had stroke as well and has aches/pains in left hip/groin area and right hand. Currently has a HA.  Wants me to check her mid back region.  She is not sure if she is having muscle spasms.  States she generally does not like medical visits.   Still has hot flashes.   Has not started taking Gabapentin.   Mother passed away in 01-17-2023.  Has a dog.   PCP: Dorena Gander, MD   No LMP recorded. Patient has had a hysterectomy.           Sexually active: No.  The current method of family planning is status post hysterectomy.    Menopausal hormone therapy: none Exercising: Yes.     Walking daily with dog.  Smoker: yes, 1/2 pack daily?  OB History     Gravida  2   Para      Term      Preterm      AB  2   Living  0      SAB      IAB      Ectopic      Multiple      Live Births              HEALTH MAINTENANCE: Last 2 paps: 2018-WNL, 12/19/2019-WNL History of abnormal Pap or positive HPV: yes, h/o VAIN 09/2010 Mammogram: 09/29/2023-WNL, Cat B Colonoscopy: ? FOB 06/30/2022-negative with PCP. Bone Density: 09/25/2017  Result osteopenia.  PCP ordered for this year.    Immunization History  Administered Date(s) Administered   PNEUMOCOCCAL CONJUGATE-20 04/23/2021      reports that she has been smoking cigarettes. She has never used smokeless tobacco. She reports current alcohol use of about 4.0 standard drinks of alcohol per week. She reports that she does not use drugs.  Past Medical History:  Diagnosis Date   Anxiety    ASCUS (atypical squamous cells of undetermined significance) on Pap smear 11/2011   negative high risk HPV   Diabetes mellitus without complication (HCC)    DVT (deep venous thrombosis) (HCC)    on birth control pills as a teenager   Hydrosalpinx    Hypertension    Osteopenia 09/2017   T score -2.0 FRAX 4.5%  / 0.9%   TIA (transient ischemic attack) 07/2022   Urinary incontinence    VAIN I (vaginal intraepithelial neoplasia grade I) 11/2010   negative colposcopy   Vitamin D  deficiency 06/2015   value 13 recommended 50,000 units weekly 12 with recheck of vitamin D  level    Past Surgical History:  Procedure Laterality Date   ABDOMINAL HYSTERECTOMY  01-17-00   Leiomyomata   CARDIAC CATHETERIZATION     MYOMECTOMY ABDOMINAL APPROACH  1996   PELVIC LAPAROSCOPY      Current Outpatient Medications  Medication Sig Dispense Refill   aspirin  EC 81 MG tablet Take 1 tablet (81 mg total) by mouth daily. Swallow whole. 30 tablet 12   FLUoxetine  (PROZAC ) 20 MG capsule Take 1 capsule (20 mg total) by mouth daily. 30 capsule 12   ibuprofen  (ADVIL ) 200 MG tablet Take 400 mg by mouth 2 (two) times daily as needed for headache or mild pain.     LORazepam  (ATIVAN ) 0.5 MG tablet Take 0.5 mg by mouth 2 (two) times daily as needed for anxiety.     metformin (FORTAMET)  1000 MG (OSM) 24 hr tablet Take 1,000 mg by mouth daily with breakfast.     olmesartan -hydrochlorothiazide  (BENICAR  HCT) 40-25 MG tablet TAKE 1 TABLET BY MOUTH DAILY 90 tablet 3   rosuvastatin  (CRESTOR ) 10 MG tablet TAKE 1 TABLET(10 MG) BY MOUTH DAILY 90 tablet 2   triamcinolone  cream (KENALOG) 0.1 % Apply 1 Application topically 2 (two) times daily as needed (irritation).     gabapentin (NEURONTIN) 300 MG capsule Take 300 mg by mouth daily. (Patient not taking: Reported on 01/02/2024)     omeprazole  (PRILOSEC) 40 MG capsule Take 1 capsule (40 mg total) by mouth daily. 30 capsule 0   traZODone (DESYREL) 50 MG tablet Take 50 mg by mouth at bedtime as needed. (Patient not taking: Reported on 01/02/2024)     No current facility-administered medications for this visit.    ALLERGIES: Amlodipine  besylate, Cymbalta [duloxetine hcl], Effexor [venlafaxine], Flexeril  [cyclobenzaprine ], Prevacid [lansoprazole], Prilosec [omeprazole ], Wellbutrin xl [bupropion],  and Codeine  Family History  Problem Relation Age of Onset   Diabetes Mother    Heart disease Mother    Diabetes Father    Hypertension Father    Diabetes Brother    Hypertension Brother    Sarcoidosis Brother    Sarcoidosis Brother    Diabetes Brother     Review of Systems  HENT:         Headache  Musculoskeletal:        Left hip and right hand pain  All other systems reviewed and are negative.   PHYSICAL EXAM:  BP 124/72   Pulse 60   Ht 5' 5.25" (1.657 m)   Wt 199 lb (90.3 kg)   SpO2 98%   BMI 32.86 kg/m     General appearance: alert, cooperative and appears stated age Head: normocephalic, without obvious abnormality, atraumatic Neck: no adenopathy, supple, symmetrical, trachea midline and thyroid normal to inspection and palpation Lungs: clear to auscultation bilaterally Breasts: normal appearance, no masses or tenderness, No nipple retraction or dimpling, No nipple discharge or bleeding, No axillary adenopathy Back - no masses palpable along midback.  Heart: regular rate and rhythm Abdomen: soft, non-tender; no masses, no organomegaly Extremities: extremities normal, atraumatic, no cyanosis or edema Skin: skin color, texture, turgor normal. No rashes or lesions Lymph nodes: cervical, supraclavicular, and axillary nodes normal. Neurologic: grossly normal  Pelvic: External genitalia:  no lesions              No abnormal inguinal nodes palpated.              Urethra:  normal appearing urethra with no masses, tenderness or lesions              Bartholins and Skenes: normal                 Vagina: normal appearing vagina with normal color and discharge, no lesions              Cervix: absent              Pap taken: yes Bimanual Exam:  Uterus:  absent              Adnexa: no mass, fullness, tenderness              Rectal exam: yes.  Confirms.              Anus:  normal sphincter tone, no lesions  Chaperone was present for exam:  Alesia Husky,  CMA  ASSESSMENT:  Encounter for breast and pelvic exam.  Other specified personal risk factors.  Status post hysterectomy for fibroids 2001.  Ovaries remain.  Hx VAIN, 09/2010.  Status post DVT.  Status post TIAs.  DM.  Hot flashes.  Back pain.    PLAN: Mammogram screening discussed. Self breast awareness reviewed. Pap and HRV collected:  yes Guidelines for Calcium , Vitamin D , regular exercise program including cardiovascular and weight bearing exercise. Medication refills:  NA We discussed the benefit of Gabapentin treating both hot flashes and chronic pain.  Labs with PCP.  I recommend she follow up with her PCP for further evaluation of her back pain and other symptoms.  Follow up:   1- 2 years per patient preference.     Additional counseling given.  yes. 25 min  total time was spent for this patient encounter, including preparation, face-to-face counseling with the patient, coordination of care, and documentation of the encounter in addition to doing the breast and pelvic exam.

## 2024-01-02 ENCOUNTER — Other Ambulatory Visit (HOSPITAL_COMMUNITY)
Admission: RE | Admit: 2024-01-02 | Discharge: 2024-01-02 | Disposition: A | Discharge status: Home / Self Care | Source: Ambulatory Visit | Attending: Obstetrics and Gynecology | Admitting: Obstetrics and Gynecology

## 2024-01-02 ENCOUNTER — Ambulatory Visit (INDEPENDENT_AMBULATORY_CARE_PROVIDER_SITE_OTHER): Payer: Medicare HMO | Admitting: Obstetrics and Gynecology

## 2024-01-02 ENCOUNTER — Encounter: Payer: Self-pay | Admitting: Obstetrics and Gynecology

## 2024-01-02 VITALS — BP 124/72 | HR 60 | Ht 65.25 in | Wt 199.0 lb

## 2024-01-02 DIAGNOSIS — R232 Flushing: Secondary | ICD-10-CM | POA: Diagnosis not present

## 2024-01-02 DIAGNOSIS — Z87411 Personal history of vaginal dysplasia: Secondary | ICD-10-CM | POA: Diagnosis not present

## 2024-01-02 DIAGNOSIS — G8929 Other chronic pain: Secondary | ICD-10-CM | POA: Diagnosis not present

## 2024-01-02 DIAGNOSIS — Z1272 Encounter for screening for malignant neoplasm of vagina: Secondary | ICD-10-CM | POA: Insufficient documentation

## 2024-01-02 DIAGNOSIS — M546 Pain in thoracic spine: Secondary | ICD-10-CM

## 2024-01-02 DIAGNOSIS — Z01419 Encounter for gynecological examination (general) (routine) without abnormal findings: Secondary | ICD-10-CM

## 2024-01-02 DIAGNOSIS — Z8742 Personal history of other diseases of the female genital tract: Secondary | ICD-10-CM

## 2024-01-02 DIAGNOSIS — Z9189 Other specified personal risk factors, not elsewhere classified: Secondary | ICD-10-CM | POA: Diagnosis not present

## 2024-01-02 NOTE — Patient Instructions (Signed)

## 2024-01-04 LAB — CYTOLOGY - PAP: Diagnosis: NEGATIVE

## 2024-01-09 ENCOUNTER — Encounter: Payer: Self-pay | Admitting: Obstetrics and Gynecology

## 2024-01-22 DIAGNOSIS — Z1211 Encounter for screening for malignant neoplasm of colon: Secondary | ICD-10-CM | POA: Diagnosis not present

## 2024-01-24 DIAGNOSIS — F332 Major depressive disorder, recurrent severe without psychotic features: Secondary | ICD-10-CM | POA: Diagnosis not present

## 2024-01-24 DIAGNOSIS — F411 Generalized anxiety disorder: Secondary | ICD-10-CM | POA: Diagnosis not present

## 2024-02-05 DIAGNOSIS — J209 Acute bronchitis, unspecified: Secondary | ICD-10-CM | POA: Diagnosis not present

## 2024-02-05 DIAGNOSIS — R350 Frequency of micturition: Secondary | ICD-10-CM | POA: Diagnosis not present

## 2024-02-05 DIAGNOSIS — R519 Headache, unspecified: Secondary | ICD-10-CM | POA: Diagnosis not present

## 2024-02-05 DIAGNOSIS — N390 Urinary tract infection, site not specified: Secondary | ICD-10-CM | POA: Diagnosis not present

## 2024-02-05 DIAGNOSIS — R6883 Chills (without fever): Secondary | ICD-10-CM | POA: Diagnosis not present

## 2024-02-05 DIAGNOSIS — R051 Acute cough: Secondary | ICD-10-CM | POA: Diagnosis not present

## 2024-02-05 DIAGNOSIS — I10 Essential (primary) hypertension: Secondary | ICD-10-CM | POA: Diagnosis not present

## 2024-02-21 DIAGNOSIS — F332 Major depressive disorder, recurrent severe without psychotic features: Secondary | ICD-10-CM | POA: Diagnosis not present

## 2024-02-21 DIAGNOSIS — F411 Generalized anxiety disorder: Secondary | ICD-10-CM | POA: Diagnosis not present

## 2024-02-29 DIAGNOSIS — E119 Type 2 diabetes mellitus without complications: Secondary | ICD-10-CM | POA: Diagnosis not present

## 2024-02-29 DIAGNOSIS — H35033 Hypertensive retinopathy, bilateral: Secondary | ICD-10-CM | POA: Diagnosis not present

## 2024-02-29 DIAGNOSIS — H2513 Age-related nuclear cataract, bilateral: Secondary | ICD-10-CM | POA: Diagnosis not present

## 2024-02-29 DIAGNOSIS — H04123 Dry eye syndrome of bilateral lacrimal glands: Secondary | ICD-10-CM | POA: Diagnosis not present

## 2024-02-29 DIAGNOSIS — H524 Presbyopia: Secondary | ICD-10-CM | POA: Diagnosis not present

## 2024-03-25 DIAGNOSIS — F332 Major depressive disorder, recurrent severe without psychotic features: Secondary | ICD-10-CM | POA: Diagnosis not present

## 2024-03-25 DIAGNOSIS — F411 Generalized anxiety disorder: Secondary | ICD-10-CM | POA: Diagnosis not present

## 2024-04-04 DIAGNOSIS — E118 Type 2 diabetes mellitus with unspecified complications: Secondary | ICD-10-CM | POA: Diagnosis not present

## 2024-04-04 DIAGNOSIS — I10 Essential (primary) hypertension: Secondary | ICD-10-CM | POA: Diagnosis not present

## 2024-04-04 DIAGNOSIS — F411 Generalized anxiety disorder: Secondary | ICD-10-CM | POA: Diagnosis not present

## 2024-04-04 DIAGNOSIS — E66811 Obesity, class 1: Secondary | ICD-10-CM | POA: Diagnosis not present

## 2024-04-08 ENCOUNTER — Encounter: Payer: Self-pay | Admitting: Acute Care

## 2024-04-24 DIAGNOSIS — E1169 Type 2 diabetes mellitus with other specified complication: Secondary | ICD-10-CM | POA: Diagnosis not present

## 2024-04-24 DIAGNOSIS — R0609 Other forms of dyspnea: Secondary | ICD-10-CM | POA: Diagnosis not present

## 2024-04-24 DIAGNOSIS — M549 Dorsalgia, unspecified: Secondary | ICD-10-CM | POA: Diagnosis not present

## 2024-04-24 DIAGNOSIS — I700 Atherosclerosis of aorta: Secondary | ICD-10-CM | POA: Diagnosis not present

## 2024-04-24 DIAGNOSIS — R0789 Other chest pain: Secondary | ICD-10-CM | POA: Diagnosis not present

## 2024-04-24 DIAGNOSIS — I10 Essential (primary) hypertension: Secondary | ICD-10-CM | POA: Diagnosis not present

## 2024-04-24 DIAGNOSIS — E785 Hyperlipidemia, unspecified: Secondary | ICD-10-CM | POA: Diagnosis not present

## 2024-04-25 ENCOUNTER — Other Ambulatory Visit: Payer: Self-pay | Admitting: Family Medicine

## 2024-04-25 DIAGNOSIS — R0609 Other forms of dyspnea: Secondary | ICD-10-CM

## 2024-05-05 DIAGNOSIS — F411 Generalized anxiety disorder: Secondary | ICD-10-CM | POA: Diagnosis not present

## 2024-05-05 DIAGNOSIS — I10 Essential (primary) hypertension: Secondary | ICD-10-CM | POA: Diagnosis not present

## 2024-05-05 DIAGNOSIS — E66811 Obesity, class 1: Secondary | ICD-10-CM | POA: Diagnosis not present

## 2024-05-05 DIAGNOSIS — E118 Type 2 diabetes mellitus with unspecified complications: Secondary | ICD-10-CM | POA: Diagnosis not present

## 2024-05-22 ENCOUNTER — Ambulatory Visit
Admission: RE | Admit: 2024-05-22 | Discharge: 2024-05-22 | Disposition: A | Discharge status: Home / Self Care | Source: Ambulatory Visit | Attending: Family Medicine | Admitting: Family Medicine

## 2024-05-22 DIAGNOSIS — J439 Emphysema, unspecified: Secondary | ICD-10-CM | POA: Diagnosis not present

## 2024-05-22 DIAGNOSIS — R0609 Other forms of dyspnea: Secondary | ICD-10-CM

## 2024-05-22 MED ORDER — IOPAMIDOL (ISOVUE-300) INJECTION 61%
80.0000 mL | Freq: Once | INTRAVENOUS | Status: AC | PRN
  Administered 2024-05-22: 80 mL via INTRAVENOUS

## 2024-05-24 DIAGNOSIS — I10 Essential (primary) hypertension: Secondary | ICD-10-CM | POA: Diagnosis not present

## 2024-05-24 DIAGNOSIS — L819 Disorder of pigmentation, unspecified: Secondary | ICD-10-CM | POA: Diagnosis not present

## 2024-05-24 DIAGNOSIS — J439 Emphysema, unspecified: Secondary | ICD-10-CM | POA: Diagnosis not present

## 2024-06-04 ENCOUNTER — Other Ambulatory Visit: Payer: Self-pay | Admitting: *Deleted

## 2024-06-04 ENCOUNTER — Encounter: Payer: Self-pay | Admitting: *Deleted

## 2024-06-04 DIAGNOSIS — Z87891 Personal history of nicotine dependence: Secondary | ICD-10-CM

## 2024-06-04 DIAGNOSIS — E118 Type 2 diabetes mellitus with unspecified complications: Secondary | ICD-10-CM | POA: Diagnosis not present

## 2024-06-04 DIAGNOSIS — Z122 Encounter for screening for malignant neoplasm of respiratory organs: Secondary | ICD-10-CM

## 2024-06-04 DIAGNOSIS — F17210 Nicotine dependence, cigarettes, uncomplicated: Secondary | ICD-10-CM

## 2024-06-04 DIAGNOSIS — I10 Essential (primary) hypertension: Secondary | ICD-10-CM | POA: Diagnosis not present

## 2024-06-04 DIAGNOSIS — E66811 Obesity, class 1: Secondary | ICD-10-CM | POA: Diagnosis not present

## 2024-06-04 DIAGNOSIS — F411 Generalized anxiety disorder: Secondary | ICD-10-CM | POA: Diagnosis not present

## 2024-06-10 ENCOUNTER — Encounter: Payer: Self-pay | Admitting: Cardiovascular Disease

## 2024-06-10 ENCOUNTER — Ambulatory Visit: Attending: Cardiovascular Disease | Admitting: Cardiovascular Disease

## 2024-06-10 VITALS — BP 152/78 | HR 66 | Ht 66.0 in

## 2024-06-10 DIAGNOSIS — I10 Essential (primary) hypertension: Secondary | ICD-10-CM

## 2024-06-10 DIAGNOSIS — M79604 Pain in right leg: Secondary | ICD-10-CM

## 2024-06-10 DIAGNOSIS — M79605 Pain in left leg: Secondary | ICD-10-CM | POA: Diagnosis not present

## 2024-06-10 DIAGNOSIS — R0789 Other chest pain: Secondary | ICD-10-CM | POA: Diagnosis not present

## 2024-06-10 DIAGNOSIS — E782 Mixed hyperlipidemia: Secondary | ICD-10-CM | POA: Diagnosis not present

## 2024-06-10 NOTE — Assessment & Plan Note (Signed)
 Patient complains of bilateral leg pain with Dopplers that revealed normal ABIs post tibial vessel disease.  Her pain sounds more like restless leg.

## 2024-06-10 NOTE — Assessment & Plan Note (Signed)
 History of hyperlipidemia on low-dose statin therapy with lipid profile performed 04/24/2024 revealing total cholesterol 126, LDL 59 HDL 48.  She is at goal for primary prevention given her coronary calcium  score of 0.

## 2024-06-10 NOTE — Assessment & Plan Note (Signed)
 History of atypical chest pain with a coronary calcium  score of 0 performed 08/03/2022.  I suspect her chest pain is related to anxiety plus or minus reflux.  I reassured her that it is unlikely is related to the her heart.

## 2024-06-10 NOTE — Assessment & Plan Note (Signed)
 History of essential hypertension with blood pressure measured today at 152/78.  She is on Benicar /hydrochlorothiazide .  Of asked her to keep a 2-week blood pressure log and to come back to see a Pharm.D. to review.  She is under life stress and has significant anxiety in her life which may be contributing to her blood pressure.

## 2024-06-10 NOTE — Progress Notes (Signed)
 06/10/2024 Tracey Buchanan   [birth year]  [id number]  Primary Physician Rolinda Millman, MD Primary Cardiologist: Dorn JINNY Lesches MD FACP, [place], [place], [place]  HPI:  Tracey Buchanan is a 68 y.o.    mild to moderately overweight single African-American female no children who was self-referred for evaluation of chest pain.  I last saw her in the office 06/12/2023.  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 Risk factors include continued tobacco abuse of one third of a pack a day for the last 40 years as well as history of hypertension. Her mother did have a CABG. She has never had a heart attack or stroke. She's had chest pain for last 6 weeks which is somewhat atypical. Mostly occurs at night or in the morning.  As result of her chest pain I did get a Myoview  stress test on her 09/08/2017 which was entirely normal.   She saw her OB/GYN and mentioned this chest pain and was referred here for further evaluation.  She gets the pain several times a week which usually awakens her from sleep, begins in her back and radiates to her chest lasting up to 10 minutes at a time associate with shortness of breath.  It usually does not occur during the daylight hours or while she is performing activities.   She had a coronary calcium  score performed 08/03/2022 that was 0.  2D echo revealed normal LV systolic function with grade 1 diastolic dysfunction.  She had lower extremity arterial Doppler studies that revealed normal ABIs bilaterally with some tibial vessel disease of no consequence.  She did have a TIA 11/23 and presented to [facility] where her workup was unrevealing.  Since I saw her a year ago her mom unfortunately passed away.  She does admit to a lot of stress and anxiety lately with family related issues.  She does get atypical chest pain and some  bilateral leg pain which sounds like restless leg syndrome.   Current Meds  Medication Sig   aspirin  EC 81 MG tablet Take 1 tablet (81 mg total) by mouth daily. Swallow whole.   FLUoxetine  (PROZAC ) 20 MG capsule Take 1 capsule (20 mg total) by mouth daily.   gabapentin (NEURONTIN) 300 MG capsule Take 300 mg by mouth daily.   ibuprofen  (ADVIL ) 200 MG tablet Take 400 mg by mouth 2 (two) times daily as needed for headache or mild pain.   LORazepam  (ATIVAN ) 0.5 MG tablet Take 0.5 mg by mouth 2 (two) times daily as needed for anxiety.   metformin (FORTAMET) 1000 MG (OSM) 24 hr tablet Take 1,000 mg by mouth daily with breakfast.   olmesartan -hydrochlorothiazide  (BENICAR  HCT) 40-25 MG tablet TAKE 1 TABLET BY MOUTH DAILY   omeprazole  (PRILOSEC) 40 MG capsule Take 1 capsule (40 mg total) by mouth daily.   rosuvastatin  (CRESTOR ) 10 MG tablet TAKE 1 TABLET(10 MG) BY MOUTH DAILY   traZODone (DESYREL) 50 MG tablet Take 50 mg by mouth at bedtime as needed.   triamcinolone  cream (KENALOG) 0.1 % Apply 1 Application topically 2 (two) times daily as needed (irritation).     Allergies  Allergen Reactions   Amlodipine  Besylate     Muscle pain, leg weakness, heavy feeling in legs    Cymbalta [Duloxetine Hcl] Nausea Only   Effexor [Venlafaxine] Other (See Comments)  Nightmares    Flexeril  [Cyclobenzaprine ] Other (See Comments)    Urinary frequency   Prevacid [Lansoprazole] Other (See Comments)    Lack of therapeutic effect   Prilosec [Omeprazole ] Other (See Comments)    Lack of therapeutic effect   Wellbutrin Xl [Bupropion] Other (See Comments)    Nightmares    Codeine Palpitations    Social History   Socioeconomic History   Marital status: Single    Spouse name: Not on file   Number of children: Not on file   Years of education: Not on file   Highest education level: Not on file  Occupational History   Not on file  Tobacco Use   Smoking status: Every Day    Current packs/day: 0.50     Types: Cigarettes   Smokeless tobacco: Never  Vaping Use   Vaping status: Never Used  Substance and Sexual Activity   Alcohol use: Yes    Alcohol/week: 4.0 standard drinks of alcohol    Types: 3 Glasses of wine, 1 Shots of liquor per week   Drug use: No   Sexual activity: Not Currently    Birth control/protection: Surgical    Comment: Hysterectomy, 1st intercourse 19 yo-5 partners  Other Topics Concern   Not on file  Social History Narrative   Not on file   Social Drivers of Health   Financial Resource Strain: Not on file  Food Insecurity: Not on file  Transportation Needs: Not on file  Physical Activity: Not on file  Stress: Not on file  Social Connections: Not on file  Intimate Partner Violence: Not on file     Review of Systems: General: negative for chills, fever, night sweats or weight changes.  Cardiovascular: negative for chest pain, dyspnea on exertion, edema, orthopnea, palpitations, paroxysmal nocturnal dyspnea or shortness of breath Dermatological: negative for rash Respiratory: negative for cough or wheezing Urologic: negative for hematuria Abdominal: negative for nausea, vomiting, diarrhea, bright red blood per rectum, melena, or hematemesis Neurologic: negative for visual changes, syncope, or dizziness All other systems reviewed and are otherwise negative except as noted above.    Blood pressure (!) 152/78, pulse 66, height 5' 6 (1.676 m), SpO2 95%.  General appearance: alert and no distress Neck: no adenopathy, no carotid bruit, no JVD, supple, symmetrical, trachea midline, and thyroid not enlarged, symmetric, no tenderness/mass/nodules Lungs: clear to auscultation bilaterally Heart: regular rate and rhythm, S1, S2 normal, no murmur, click, rub or gallop Extremities: extremities normal, atraumatic, no cyanosis or edema Pulses: 2+ and symmetric Skin: Skin color, texture, turgor normal. No rashes or lesions Neurologic: Grossly normal  EKG EKG  Interpretation Date/Time:  Monday June 10 2024 10:58:48 EDT Ventricular Rate:  66 PR Interval:  152 QRS Duration:  86 QT Interval:  428 QTC Calculation: 448 R Axis:   1  Text Interpretation: Normal sinus rhythm Nonspecific T wave abnormality When compared with ECG of 12-Jun-2023 08:27, Criteria for Inferior infarct are no longer Present Nonspecific T wave abnormality has replaced inverted T waves in Inferior leads Confirmed by Court Carrier [phone]) on 06/10/2024 11:42:52 AM    ASSESSMENT AND PLAN:   Hypertension History of essential hypertension with blood pressure measured today at 152/78.  She is on Benicar /hydrochlorothiazide .  Of asked her to keep a 2-week blood pressure log and to come back to see a Pharm.D. to review.  She is under life stress and has significant anxiety in her life which may be contributing to her blood pressure.  Hyperlipidemia History of  hyperlipidemia on low-dose statin therapy with lipid profile performed 04/24/2024 revealing total cholesterol 126, LDL 59 HDL 48.  She is at goal for primary prevention given her coronary calcium  score of 0.  Atypical chest pain History of atypical chest pain with a coronary calcium  score of 0 performed 08/03/2022.  I suspect her chest pain is related to anxiety plus or minus reflux.  I reassured her that it is unlikely is related to the her heart.  Bilateral leg pain Patient complains of bilateral leg pain with Dopplers that revealed normal ABIs post tibial vessel disease.  Her pain sounds more like restless leg.     Dorn DOROTHA Lesches MD FACP,FACC,FAHA, [facility] 06/10/2024 11:54 AM

## 2024-06-10 NOTE — Patient Instructions (Signed)
 Medication Instructions:  Your physician recommends that you continue on your current medications as directed. Please refer to the Current Medication list given to you today.  *If you need a refill on your cardiac medications before your next appointment, please call your pharmacy*  Follow-Up: At [facility], you and your health needs are our priority.  As part of our continuing mission to provide you with exceptional heart care, our providers are all part of one team.  This team includes your primary Cardiologist (physician) and Advanced Practice Providers or APPs (Physician Assistants and Nurse Practitioners) who all work together to provide you with the care you need, when you need it.  Your next appointment:   6 month(s)  Provider:   Jon Hails, PA-C, Callie Goodrich, PA-C, Kathleen Johnson, PA-C, Damien Braver, NP, or Katlyn West, NP         Then, Dorn Lesches, MD will plan to see you again in 12 month(s).    We recommend signing up for the patient portal called MyChart.  Sign up information is provided on this After Visit Summary.  MyChart is used to connect with patients for Virtual Visits (Telemedicine).  Patients are able to view lab/test results, encounter notes, upcoming appointments, etc.  Non-urgent messages can be sent to your provider as well.   To learn more about what you can do with MyChart, go to [email or web address].   Other Instructions Dr. Lesches has requested that you schedule an appointment with one of our clinical pharmacists for a blood pressure check appointment within the next 4-5 weeks.  If you monitor your blood pressure (BP) at home, please bring your BP cuff and your BP readings with you to this appointment  HOW TO TAKE YOUR BLOOD PRESSURE: Rest 5 minutes before taking your blood pressure. Don't smoke or drink caffeinated beverages for at least 30 minutes before. Take your blood pressure before (not after) you eat. Sit comfortably with your  back supported and both feet on the floor (don't cross your legs). Elevate your arm to heart level on a table or a desk. Use the proper sized cuff. It should fit smoothly and snugly around your bare upper arm. There should be enough room to slip a fingertip under the cuff. The bottom edge of the cuff should be 1 inch above the crease of the elbow. Ideally, take 3 measurements at one sitting and record the average.

## 2024-06-13 ENCOUNTER — Ambulatory Visit

## 2024-06-13 VITALS — BP 152/86 | HR 77 | Temp 98.5°F | Ht 66.0 in | Wt 195.6 lb

## 2024-06-13 DIAGNOSIS — F419 Anxiety disorder, unspecified: Secondary | ICD-10-CM | POA: Diagnosis not present

## 2024-06-13 DIAGNOSIS — M549 Dorsalgia, unspecified: Secondary | ICD-10-CM | POA: Diagnosis not present

## 2024-06-13 DIAGNOSIS — R5383 Other fatigue: Secondary | ICD-10-CM | POA: Diagnosis not present

## 2024-06-13 DIAGNOSIS — R918 Other nonspecific abnormal finding of lung field: Secondary | ICD-10-CM | POA: Diagnosis not present

## 2024-06-13 DIAGNOSIS — F17210 Nicotine dependence, cigarettes, uncomplicated: Secondary | ICD-10-CM | POA: Diagnosis not present

## 2024-06-13 DIAGNOSIS — J439 Emphysema, unspecified: Secondary | ICD-10-CM

## 2024-06-13 DIAGNOSIS — Z720 Tobacco use: Secondary | ICD-10-CM

## 2024-06-13 LAB — CBC WITH DIFFERENTIAL/PLATELET
Basophils Absolute: 0 K/uL (ref 0.0–0.1)
Basophils Relative: 0.5 % (ref 0.0–3.0)
Eosinophils Absolute: 0.1 K/uL (ref 0.0–0.7)
Eosinophils Relative: 1.9 % (ref 0.0–5.0)
HCT: 42 % (ref 36.0–46.0)
Hemoglobin: 13.4 g/dL (ref 12.0–15.0)
Lymphocytes Relative: 35.4 % (ref 12.0–46.0)
Lymphs Abs: 2.4 K/uL (ref 0.7–4.0)
MCHC: 31.8 g/dL (ref 30.0–36.0)
MCV: 82.6 fl (ref 78.0–100.0)
Monocytes Absolute: 0.5 K/uL (ref 0.1–1.0)
Monocytes Relative: 7.2 % (ref 3.0–12.0)
Neutro Abs: 3.7 K/uL (ref 1.4–7.7)
Neutrophils Relative %: 55 % (ref 43.0–77.0)
Platelets: 233 K/uL (ref 150.0–400.0)
RBC: 5.08 Mil/uL (ref 3.87–5.11)
RDW: 16.8 % — ABNORMAL HIGH (ref 11.5–15.5)
WBC: 6.7 K/uL (ref 4.0–10.5)

## 2024-06-13 LAB — CALCIUM: Calcium: 9.3 mg/dL (ref 8.4–10.5)

## 2024-06-13 LAB — SEDIMENTATION RATE: Sed Rate: 32 mm/h — ABNORMAL HIGH (ref 0–30)

## 2024-06-13 MED ORDER — NICOTINE 7 MG/24HR TD PT24: 14.0000 mg | MEDICATED_PATCH | Freq: Every day | TRANSDERMAL | Status: AC

## 2024-06-13 MED ORDER — NICOTINE 7 MG/24HR TD PT24: 7.0000 mg | MEDICATED_PATCH | Freq: Every day | TRANSDERMAL | Status: AC

## 2024-06-13 NOTE — Progress Notes (Addendum)
 New Patient Pulmonology Office Visit   Subjective:  Patient ID: Tracey Buchanan, female    DOB: 01-12-1956  MRN: [id number]  Referred by: Rolinda Millman, MD  CC:  Chief Complaint  Patient presents with   Shortness of Breath    New patient consult    Consult    sob    HPI Tracey Buchanan is a 68 y.o. female who is referred to this clinic for shortness of breath.  Patient is a chronic smoker who has smoked for over 40 years. She reports upper back/lateral chest pain when she has to look back while driving in her car.  She has been told that this could be musculoskeletal pain but she does not believe and is wanting workup Reports some cough and morning sputum production, scant She denies shortness of breath  She was previously followed up by lung nodule program in 2022.  She had a right upper lung nodule which had grown on her scans from 2023-2024, from 10.5 mm to 14.8 mm.  Subsequent scans have shown these nodules to be stable, however compared to 2022 scan, there has been growth.  Her CT scans also show discrete cyst scattered throughout the lung.  While these can be emphysema from regular smoking, cannot entirely rule out underlying cystic lung disease- LAM, histiocytosis.  I tried to communicate this with the patient and she got visibly anxious and overwhelmed.  Patient has known anxiety disorder and has been on antianxiety medications.  She told me she is going through menopause as well and has severe sweating  She had 2 brothers with diagnosis of sarcoidosis. She is not married and has does not have children. Her closest family at her sisters and brother Patient reports that she has several because of multiple deaths in her family   ROS Review of symptoms negative except mentioned above  Allergies: Amlodipine  besylate, Cymbalta [duloxetine hcl], Effexor [venlafaxine], Flexeril  [cyclobenzaprine ], Prevacid [lansoprazole], Prilosec [omeprazole ], Wellbutrin xl [bupropion], and  Codeine  Current Outpatient Medications:    aspirin  EC 81 MG tablet, Take 1 tablet (81 mg total) by mouth daily. Swallow whole., Disp: 30 tablet, Rfl: 12   FLUoxetine  (PROZAC ) 20 MG capsule, Take 1 capsule (20 mg total) by mouth daily., Disp: 30 capsule, Rfl: 12   gabapentin (NEURONTIN) 300 MG capsule, Take 300 mg by mouth daily., Disp: , Rfl:    ibuprofen  (ADVIL ) 200 MG tablet, Take 400 mg by mouth 2 (two) times daily as needed for headache or mild pain., Disp: , Rfl:    LORazepam  (ATIVAN ) 0.5 MG tablet, Take 0.5 mg by mouth 2 (two) times daily as needed for anxiety., Disp: , Rfl:    metformin (FORTAMET) 1000 MG (OSM) 24 hr tablet, Take 1,000 mg by mouth daily with breakfast., Disp: , Rfl:    olmesartan -hydrochlorothiazide  (BENICAR  HCT) 40-25 MG tablet, TAKE 1 TABLET BY MOUTH DAILY, Disp: 90 tablet, Rfl: 3   omeprazole  (PRILOSEC) 40 MG capsule, Take 1 capsule (40 mg total) by mouth daily., Disp: 30 capsule, Rfl: 0   rosuvastatin  (CRESTOR ) 10 MG tablet, TAKE 1 TABLET(10 MG) BY MOUTH DAILY, Disp: 90 tablet, Rfl: 2   traZODone (DESYREL) 50 MG tablet, Take 50 mg by mouth at bedtime as needed., Disp: , Rfl:    triamcinolone  cream (KENALOG) 0.1 %, Apply 1 Application topically 2 (two) times daily as needed (irritation)., Disp: , Rfl:   Current Facility-Administered Medications:    nicotine (NICODERM CQ - dosed in mg/24 hr) patch 14 mg, 14  mg, Transdermal, Daily,    nicotine (NICODERM CQ - dosed in mg/24 hr) patch 7 mg, 7 mg, Transdermal, Daily,  Past Medical History:  Diagnosis Date   Anxiety    ASCUS (atypical squamous cells of undetermined significance) on Pap smear 11/2011   negative high risk HPV   Diabetes mellitus without complication (HCC)    DVT (deep venous thrombosis) (HCC)    on birth control pills as a teenager   Hydrosalpinx    Hypertension    Osteopenia 09/2017   T score -2.0 FRAX 4.5% / 0.9%   TIA (transient ischemic attack) 07/2022   Urinary incontinence    VAIN I (vaginal  intraepithelial neoplasia grade I) 11/2010   negative colposcopy   Vitamin D  deficiency 06/2015   value 13 recommended 50,000 units weekly 12 with recheck of vitamin D  level   Past Surgical History:  Procedure Laterality Date   ABDOMINAL HYSTERECTOMY  2001   Leiomyomata   CARDIAC CATHETERIZATION     MYOMECTOMY ABDOMINAL APPROACH  1996   PELVIC LAPAROSCOPY     Family History  Problem Relation Age of Onset   Diabetes Mother    Heart disease Mother    Diabetes Father    Hypertension Father    Diabetes Brother    Hypertension Brother    Sarcoidosis Brother    Sarcoidosis Brother    Diabetes Brother    Social History   Socioeconomic History   Marital status: Single    Spouse name: Not on file   Number of children: Not on file   Years of education: Not on file   Highest education level: Not on file  Occupational History   Not on file  Tobacco Use   Smoking status: Every Day    Current packs/day: 0.50    Types: Cigarettes   Smokeless tobacco: Never  Vaping Use   Vaping status: Never Used  Substance and Sexual Activity   Alcohol use: Yes    Alcohol/week: 4.0 standard drinks of alcohol    Types: 3 Glasses of wine, 1 Shots of liquor per week   Drug use: No   Sexual activity: Not Currently    Birth control/protection: Surgical    Comment: Hysterectomy, 1st intercourse 19 yo-5 partners  Other Topics Concern   Not on file  Social History Narrative   Not on file   Social Drivers of Health   Financial Resource Strain: Not on file  Food Insecurity: Not on file  Transportation Needs: Not on file  Physical Activity: Not on file  Stress: Not on file  Social Connections: Not on file  Intimate Partner Violence: Not on file         Objective:  BP (!) 152/86   Pulse 77   Temp 98.5 F (36.9 C) (Oral)   Ht 5' 6 (1.676 m)   Wt 195 lb 9.6 oz (88.7 kg)   SpO2 98%   BMI 31.57 kg/m    Physical Exam Constitutional:      General: She is not in acute distress.     Appearance: Normal appearance.  HENT:     Mouth/Throat:     Mouth: Mucous membranes are moist.  Cardiovascular:     Rate and Rhythm: Normal rate.  Pulmonary:     Effort: No respiratory distress.     Breath sounds: No wheezing or rales.  Musculoskeletal:     Right lower leg: No edema.     Left lower leg: No edema.  Skin:  General: Skin is warm.  Neurological:     Mental Status: She is alert and oriented to person, place, and time.  Psychiatric:        Mood and Affect: Mood normal.     Diagnostic Review:    Pft     No data to display             CT chest Jan 2025  No pleural fluid. Moderate centrilobular emphysema. No change in bilateral ground-glass upper lobe nodules of maximally 14.8 mm.  Lung cancer screening CT chest 02/2023  Mild to moderate emphysema. No pleural fluid, airspace consolidation, atelectasis or pneumothorax. 2.5 mm non solid nodule is identified within the periphery of the superior segment of left lower lobe. Unchanged. There are 3 non solid nodules identified. The largest is in the right upper lobe measuring 14.8 mm. On the previous exam this measured 10.5 mm.    Ct chest 01/2022  Stable part solid pulmonary nodule of the right upper lobe measuring 9.5 mm in overall diameter with 5.2 mm solid component. Other previously seen solid and sub solid pulmonary nodules are stable. Numerous ill-defined ground-glass nodules of the upper lungs, likely due to respiratory bronchiolitis. Stable irregular cyst of the right upper lobe.    Echo 07/2022 1. Left ventricular ejection fraction, by estimation, is 60 to 65%. The  left ventricle has normal function. The left ventricle has no regional  wall motion abnormalities. There is severe asymmetric left ventricular  hypertrophy of the septal segment. Left   ventricular diastolic parameters are consistent with Grade I diastolic  dysfunction (impaired relaxation).   2. Right ventricular systolic function  is normal. The right ventricular  size is normal.   3. The mitral valve is grossly normal. No evidence of mitral valve  regurgitation. No evidence of mitral stenosis.   4. The aortic valve is tricuspid. Aortic valve regurgitation is not  visualized. No aortic stenosis is present.   5. The inferior vena cava is normal in size with greater than 50%  respiratory variability, suggesting right atrial pressure of 3 mmHg.    Assessment & Plan:   Assessment & Plan Emphysema, unspecified (HCC) Visible on CT chest. Discussed the symptoms, etiology, pathophysiology, diagnostic test, treatment, flare ups, prognosis of COPD.  Orders:   CBC with Differential   IgE; Future   Antinuclear Antib (ANA); Future   Rheumatoid Factor; Future   Angiotensin converting enzyme; Future   Sed Rate (ESR); Future   Calcium ; Future   VITAMIN D  25 Hydroxy (Vit-D Deficiency, Fractures)   Vitamin D  1,25 dihydroxy   Pulmonary function test; Future  Lung nodules Reviewed prior and current CT chest with the patient.  She was previously followed up by lung nodule program in 2022.  She had a right upper lung nodule which had grown on her scans from 2023-2024, from 10.5 mm to 14.8 mm.  Subsequent scans have shown these nodules to be stable, however compared to 2022 scan, there has been growth.  Her CT scans also show discrete cyst scattered throughout the lung.  While these can be emphysema from regular smoking, cannot entirely rule out underlying cystic lung disease- LAM, histiocytosis.  I tried to communicate this with the patient and she got visibly anxious and overwhelmed. will follow closely Orders:   CBC with Differential   IgE; Future   Antinuclear Antib (ANA); Future   Rheumatoid Factor; Future   Angiotensin converting enzyme; Future   Sed Rate (ESR); Future   Calcium ; Future  VITAMIN D  25 Hydroxy (Vit-D Deficiency, Fractures)   Vitamin D  1,25 dihydroxy  Tobacco abuse Smoking/Tobacco Cessation  Counseling Tracey Buchanan is a current user of tobacco or nicotine products. She is considering quitting at this time. Counseling provided today addressed the risks of continued use and the benefits of cessation. Discussed tobacco/nicotine use history, readiness to quit, and evidence-based treatment options including behavioral strategies, support resources, and pharmacologic therapies. She reports adverse effects with chantix and is already on prozac  for depression.  Provided encouragement and educational materials on steps and resources to quit smoking. Patient questions were addressed, and follow-up recommended for continued support. Prescribed nicotine patches. Total time spent on counseling: 11 minutes.    Orders:   nicotine (NICODERM CQ - dosed in mg/24 hr) patch 14 mg   nicotine (NICODERM CQ - dosed in mg/24 hr) patch 7 mg   Pulmonary function test; Future  Upper back pain Doesn't seem to be related to COPD With significant fatigue and upper back pain, wonder if she has neuropathic pain, fibromyalgia or autoimmune disease She reports sarcoidosis in 2 brothers As pt was visibly anxious and overwhelmed during the encounter today, I did not discuss further about sarcoidosis-Will need to address next visit  I have ordered some autoimmune labs     Anxiety On Prozac . Patient reports she has not been taking Prozac  or gabapentin regularly as she does not like pills    Other fatigue As above Orders:   CBC with Differential   IgE; Future   Antinuclear Antib (ANA); Future   Rheumatoid Factor; Future   Angiotensin converting enzyme; Future   Sed Rate (ESR); Future   Calcium ; Future   VITAMIN D  25 Hydroxy (Vit-D Deficiency, Fractures)   Vitamin D  1,25 dihydroxy   nicotine (NICODERM CQ - dosed in mg/24 hr) patch 14 mg   nicotine (NICODERM CQ - dosed in mg/24 hr) patch 7 mg    Thank you for the opportunity to take part in the care of Tracey Buchanan   Return in about 1 month (around  07/16/2024).     Parker Sawatzky Pleas, MD [facility] Office: [phone]   I personally spent a total of 65 minutes in the care of the patient today including getting/reviewing separately obtained history, performing a medically appropriate exam/evaluation, counseling and educating, placing orders, documenting clinical information in the EHR, and independently interpreting results.

## 2024-06-13 NOTE — Patient Instructions (Signed)
 It was a pleasure to see you today. Please have your labs drawn in our clinic today. Your pulmonary function test will be scheduled closer to your next follow up visit.

## 2024-06-13 NOTE — Assessment & Plan Note (Signed)
 On Prozac . Patient reports she has not been taking Prozac  or gabapentin regularly as she does not like pills

## 2024-06-13 NOTE — Assessment & Plan Note (Addendum)
 Visible on CT chest. Discussed the symptoms, etiology, pathophysiology, diagnostic test, treatment, flare ups, prognosis of COPD.  Orders:   CBC with Differential   IgE; Future   Antinuclear Antib (ANA); Future   Rheumatoid Factor; Future   Angiotensin converting enzyme; Future   Sed Rate (ESR); Future   Calcium ; Future   VITAMIN D  25 Hydroxy (Vit-D Deficiency, Fractures)   Vitamin D  1,25 dihydroxy   Pulmonary function test; Future

## 2024-06-15 LAB — ANA: Anti Nuclear Antibody (ANA): POSITIVE — AB

## 2024-06-15 LAB — ANTI-NUCLEAR AB-TITER (ANA TITER)
ANA TITER: 1:40 {titer} — ABNORMAL HIGH
ANA Titer 1: 1:40 {titer} — ABNORMAL HIGH

## 2024-06-15 LAB — RHEUMATOID FACTOR: Rheumatoid fact SerPl-aCnc: <10 [IU]/mL (ref <14)

## 2024-06-15 LAB — IGE: IgE (Immunoglobulin E), Serum: 22 kU/L (ref <=114)

## 2024-06-15 LAB — ANGIOTENSIN CONVERTING ENZYME: Angiotensin-Converting Enzyme: 19 U/L (ref 9–67)

## 2024-06-15 LAB — VITAMIN D 25 HYDROXY (VIT D DEFICIENCY, FRACTURES): Vit D, 25-Hydroxy: 23 ng/mL — ABNORMAL LOW (ref 30–100)

## 2024-06-18 ENCOUNTER — Ambulatory Visit: Payer: Self-pay

## 2024-06-18 DIAGNOSIS — M791 Myalgia, unspecified site: Secondary | ICD-10-CM

## 2024-06-18 DIAGNOSIS — R7689 Other specified abnormal immunological findings in serum: Secondary | ICD-10-CM

## 2024-06-19 ENCOUNTER — Telehealth: Payer: Self-pay

## 2024-06-19 MED ORDER — NICOTINE 7 MG/24HR TD PT24: 7.0000 mg | MEDICATED_PATCH | Freq: Every day | TRANSDERMAL | 0 refills | Status: AC

## 2024-06-19 MED ORDER — NICOTINE 14 MG/24HR TD PT24: 14.0000 mg | MEDICATED_PATCH | Freq: Every day | TRANSDERMAL | 0 refills | Status: AC

## 2024-06-19 NOTE — Telephone Encounter (Signed)
 Copied from CRM [id number]. Topic: Clinical - Prescription Issue >> Jun 17, 2024 12:04 PM Celestine FALCON wrote: Reason for CRM: Pt stated she recently had a visit and was told a new medication nicotine (NICODERM CQ - dosed in mg/24 hr) patch 14 mg   [[id number]] would be called into [id number] - [place], [place] - [address] AT [facility] [address], but she hasn't heard anything from the clinic, and the pharmacy stated they did not have an order for the prescription. I do see it was E-signed by Dr. Pleas on 06/13/2024, but it doesn't show pharmacy info on the order.  Please call the pt with an update at [phone] ok to leave a vm.    Spoke with patient Rx is at pharmacy    -[facility]

## 2024-06-19 NOTE — Progress Notes (Signed)
 Called and spoke to pt - advised of lab results per Dr. Pleas. Pt was agreeable with rheumatology referral. Referral placed. Pt also states that her pharmacy did not receive nicotine patch prescription. After further inspection, nicotine patches were set as clinic administered and not a prescription.  Prescription sent to [facility], [facility].

## 2024-06-25 DIAGNOSIS — K219 Gastro-esophageal reflux disease without esophagitis: Secondary | ICD-10-CM | POA: Diagnosis not present

## 2024-06-25 DIAGNOSIS — I10 Essential (primary) hypertension: Secondary | ICD-10-CM | POA: Diagnosis not present

## 2024-06-25 DIAGNOSIS — E118 Type 2 diabetes mellitus with unspecified complications: Secondary | ICD-10-CM | POA: Diagnosis not present

## 2024-06-25 DIAGNOSIS — Z23 Encounter for immunization: Secondary | ICD-10-CM | POA: Diagnosis not present

## 2024-06-25 DIAGNOSIS — R202 Paresthesia of skin: Secondary | ICD-10-CM | POA: Diagnosis not present

## 2024-06-25 DIAGNOSIS — J439 Emphysema, unspecified: Secondary | ICD-10-CM | POA: Diagnosis not present

## 2024-06-27 DIAGNOSIS — F411 Generalized anxiety disorder: Secondary | ICD-10-CM | POA: Diagnosis not present

## 2024-06-27 DIAGNOSIS — F332 Major depressive disorder, recurrent severe without psychotic features: Secondary | ICD-10-CM | POA: Diagnosis not present

## 2024-07-01 DIAGNOSIS — F17200 Nicotine dependence, unspecified, uncomplicated: Secondary | ICD-10-CM | POA: Diagnosis not present

## 2024-07-01 DIAGNOSIS — R112 Nausea with vomiting, unspecified: Secondary | ICD-10-CM | POA: Diagnosis not present

## 2024-07-01 DIAGNOSIS — R42 Dizziness and giddiness: Secondary | ICD-10-CM | POA: Diagnosis not present

## 2024-07-02 ENCOUNTER — Other Ambulatory Visit: Payer: Medicare HMO

## 2024-07-05 DIAGNOSIS — E66811 Obesity, class 1: Secondary | ICD-10-CM | POA: Diagnosis not present

## 2024-07-05 DIAGNOSIS — E118 Type 2 diabetes mellitus with unspecified complications: Secondary | ICD-10-CM | POA: Diagnosis not present

## 2024-07-05 DIAGNOSIS — I10 Essential (primary) hypertension: Secondary | ICD-10-CM | POA: Diagnosis not present

## 2024-07-05 DIAGNOSIS — F411 Generalized anxiety disorder: Secondary | ICD-10-CM | POA: Diagnosis not present

## 2024-07-08 ENCOUNTER — Encounter

## 2024-07-11 DIAGNOSIS — F332 Major depressive disorder, recurrent severe without psychotic features: Secondary | ICD-10-CM | POA: Diagnosis not present

## 2024-07-11 DIAGNOSIS — F411 Generalized anxiety disorder: Secondary | ICD-10-CM | POA: Diagnosis not present

## 2024-07-17 ENCOUNTER — Ambulatory Visit: Admitting: Pharmacist Clinician (PhC)/ Clinical Pharmacy Specialist

## 2024-07-17 ENCOUNTER — Ambulatory Visit

## 2024-08-08 ENCOUNTER — Ambulatory Visit: Attending: Nurse Practitioner

## 2024-08-08 DIAGNOSIS — M25562 Pain in left knee: Secondary | ICD-10-CM | POA: Diagnosis present

## 2024-08-08 DIAGNOSIS — R42 Dizziness and giddiness: Secondary | ICD-10-CM | POA: Insufficient documentation

## 2024-08-08 DIAGNOSIS — R2681 Unsteadiness on feet: Secondary | ICD-10-CM | POA: Diagnosis present

## 2024-08-08 NOTE — Therapy (Signed)
 OUTPATIENT PHYSICAL THERAPY VESTIBULAR EVALUATION     Patient Name: Tracey Buchanan MRN: [id number] DOB:[birth year], 68 y.o., female Today's Date: 08/08/2024  END OF SESSION:  PT End of Session - 08/08/24 1142     Visit Number 1    Date for Recertification  10/03/24    Authorization Type [employer]    PT Start Time 1100    PT Stop Time 1136    PT Time Calculation (min) 36 min    Activity Tolerance Patient tolerated treatment well    Behavior During Therapy [facility] for tasks assessed/performed         Past Medical History:  Diagnosis Date   Anxiety    ASCUS (atypical squamous cells of undetermined significance) on Pap smear 11/2011   negative high risk HPV   Diabetes mellitus without complication (HCC)    DVT (deep venous thrombosis) (HCC)    on birth control pills as a teenager   Hydrosalpinx    Hypertension    Osteopenia 09/2017   T score -2.0 FRAX 4.5% / 0.9%   TIA (transient ischemic attack) 07/2022   Urinary incontinence    VAIN I (vaginal intraepithelial neoplasia grade I) 11/2010   negative colposcopy   Vitamin D  deficiency 06/2015   value 13 recommended 50,000 units weekly 12 with recheck of vitamin D  level   Past Surgical History:  Procedure Laterality Date   ABDOMINAL HYSTERECTOMY  2001   Leiomyomata   CARDIAC CATHETERIZATION     MYOMECTOMY ABDOMINAL APPROACH  1996   PELVIC LAPAROSCOPY     Patient Active Problem List   Diagnosis Date Noted   Emphysema, unspecified (HCC) 06/13/2024   Bilateral leg pain 06/10/2024   TIA (transient ischemic attack) 07/28/2022   Hyperlipidemia 06/28/2022   Atypical chest pain 08/22/2017   Anxiety    Hypertension    DVT (deep venous thrombosis) (HCC)    Osteopenia    Hydrosalpinx     PCP: Rolinda Millman, MD REFERRING PROVIDER: Laurice President, NP  REFERRING DIAG: R42  THERAPY DIAG:  Dizziness and giddiness  Episodic lightheadedness  Unsteadiness on feet  Acute pain of left knee  ONSET DATE:  06/2024  Rationale for Evaluation and Treatment: Rehabilitation  SUBJECTIVE:   SUBJECTIVE STATEMENT: Pt reports she started experiencing symptoms of dizziness and lightheadedness following having a flu shot and RSV shot. Pt states she experienced the dizziness episode for ten days before it started to subside. Pt also states she has some pain in the L knee that has been a constant aching feeling. She would like to also improve her balance with PT.   Pt accompanied by: self  PERTINENT HISTORY: See PMH chart above   PAIN:  Are you having pain? Yes: NPRS scale: Not rated Pain location: L knee  Pain description: Dull ache  Aggravating factors: Certain movements; comes and goes  Relieving factors: Rest   PRECAUTIONS: None  RED FLAGS: None   WEIGHT BEARING RESTRICTIONS: No  FALLS: Has patient fallen in last 6 months? No  LIVING ENVIRONMENT: Lives with: lives with their family and lives alone Lives in: House/apartment Stairs: Yes: External: 5 steps; on left going up Has following equipment at home: FWW occasionally when feeling off balance but does not use often   PLOF: Independent  PATIENT GOALS: Decrease symptoms of dizziness and lightheadedness and feel more confident in walking and mobility at home.   OBJECTIVE:  Note: Objective measures were completed at Evaluation unless otherwise noted.  DIAGNOSTIC FINDINGS: No imaging for this  vestibular episode specifically; other imaging done   COGNITION: Overall cognitive status: Within functional limits for tasks assessed   SENSATION: WFL   POSTURE:  No Significant postural limitations  Cervical ROM:    WFL   LOWER EXTREMITY MMT:   MMT Right eval Left eval  Hip flexion 5/5  4/5  Hip abduction 5/5 5/5  Hip adduction 5/5 5/5  Hip internal rotation    Hip external rotation    Knee flexion    Knee extension 4+/5 4/5  Ankle dorsiflexion    Ankle plantarflexion    Ankle inversion    Ankle eversion    (Blank  rows = not tested)   TRANSFERS: Assistive device utilized: None  Sit to stand: Complete Independence Stand to sit: Complete Independence Chair to chair: Complete Independence   GAIT: Gait pattern: [facility] Distance walked: In Clinic  Assistive device utilized: None Level of assistance: Complete Independence Comments: No significant deficits; decreased cadence    VESTIBULAR ASSESSMENT:   SYMPTOM BEHAVIOR:  Subjective history: Ringing sound in ear   Non-Vestibular symptoms: changes in hearing  Type of dizziness: Lightheadedness/Faint  Aggravating factors: Induced by position change: supine to sit and sit to stand  Relieving factors: no known relieving factors  Progression of symptoms: better  OCULOMOTOR EXAM:  Ocular ROM: No Limitations  Spontaneous Nystagmus: absent  Saccades: intact    Positional tests: Right Dix-Hallpike: no nystagmus Left Dix-Hallpike: no nystagmus    VESTIBULAR - OCULAR REFLEX:   Slow VOR: Normal  VOR Cancellation: Normal     POSITIONAL TESTING: Right Dix-Hallpike: no nystagmus Left Dix-Hallpike: no nystagmus  MOTION SENSITIVITY:  Motion Sensitivity Quotient Intensity: 0 = none, 1 = Lightheaded, 2 = Mild, 3 = Moderate, 4 = Severe, 5 = Vomiting  Intensity  1. Sitting to supine 0  2. Supine to L side   3. Supine to R side   4. Supine to sitting 1  5. L Hallpike-Dix 0  6. Up from L    7. R Hallpike-Dix 0  8. Up from R    9. Sitting, head tipped to L knee   10. Head up from L knee   11. Sitting, head tipped to R knee   12. Head up from R knee   13. Sitting head turns x5   14.Sitting head nods x5   15. In stance, 180 turn to L    16. In stance, 180 turn to R     OTHOSTATICS: not done; pt reports BP sometimes runs high for the systolic                                                                                                                              TREATMENT DATE: 08/08/24- Eval     Habituation:  Seated Vertical Head  Movements: symptom description: No symptom recurrence  and Seated Horizontal Head Movements: symptom description: No symptom recurrence    PATIENT EDUCATION: Education details: HEP Person educated:  Patient Education method: Explanation and Demonstration Education comprehension: verbalized understanding and returned demonstration  HOME EXERCISE PROGRAM:  Habituation:  VOR Cancellation- Thumb as target moving hand with eyes horizontally and vertically   VOR- Eyes on target moving head horizontally and vertically   GOALS: Goals reviewed with patient? Yes  SHORT TERM GOALS: Target date: 09/05/2024  Pt will be fully independent with initial HEP in order to obtain carryover into ADLs  Baseline: Goal status: INITIAL    LONG TERM GOALS: Target date: 10/03/24  Pt will increase LLE MMT flexion to 5/5 and BLE knee extension to 5/5 Baseline:  Goal status: INITIAL  2.  Pt will be able to perform transfers (supine to sit and sit to stand) without inducing symptoms of dizziness Baseline:  Goal status: INITIAL  3.  Pt will be able to state and demonstrate 3 fall prevention strategies learned in PT  Baseline:  Goal status: INITIAL   ASSESSMENT:  CLINICAL IMPRESSION: Patient is a 68 y.o. female who was seen today for physical therapy evaluation and treatment for dizziness. Pt exhibits symptoms related to anxiety and hesitancy with diagnosed anxiety. Pt has a small support dog. Pt exhibits no recreation of symptoms of dizziness with dix hallpike maneuver but experienced some lightheadedness with transitions from supine to sit. Pt will benefit from general strengthening for LLE weakness, dynamic balance to increase confidence in gait, and habituation activities to decrease symptoms of dizziness.   OBJECTIVE IMPAIRMENTS: decreased balance, dizziness, and pain.   ACTIVITY LIMITATIONS: Certain movement restrictions due to L knee pain and symptoms of lightheadedness  PARTICIPATION  LIMITATIONS: None   PERSONAL FACTORS: Fitness and 3+ comorbidities:   are also affecting patient's functional outcome.   REHAB POTENTIAL: Good  CLINICAL DECISION MAKING: Evolving/moderate complexity  EVALUATION COMPLEXITY: Moderate   PLAN:  PT FREQUENCY: 1x/week  PT DURATION: 8 weeks  PLANNED INTERVENTIONS: 97110-Therapeutic exercises, 97530- Therapeutic activity, [id number]- Neuromuscular re-education, 97535- Self Care, 02859- Manual therapy, [phone]- Gait training, and Patient/Family education  PLAN FOR NEXT SESSION: Dynamic balance, vestibular habituation, general LE strengthening    Thersia Alder, Student-PT 08/08/2024, 12:26 PM

## 2024-08-13 ENCOUNTER — Ambulatory Visit

## 2024-08-13 NOTE — Progress Notes (Deleted)
 Patient ID: Tracey Buchanan                 DOB: 27-Sep-1955                      MRN: [id number]      HPI: Tracey Buchanan is a 68 y.o. female referred by Dr. Court to HTN clinic. PMH is significant for HTN, remote DVT while on birth control as a teenager, possible TIA, obesity, anxiety, and tobacco abuse.      Patient was last evaluated by Dr. Court in October 2025 for chest pain. Patient has underwent a lot of stress and anxiety in her life with the passing of her mother and it was suspected her chest pain is related to anxiety +/- reflux. Her blood pressure was elevated at that visit, 152/78 and thought to be due to stress and anxiety. She is currently managed on olmesartan -hydrochlorothiazide  40-25 mg daily. She was asked to keep a 2-week blood pressure log and follow up with PharmD to review.  Patient presents today..   Current HTN meds: olmesartan -hydrochlorothiazide  40-25 mg daily Previously tried: amlodipine  (muscle pain/leg weakness/feeling heavy in legs) BP goal: < 130/80 Labs (04/2024): Na 141, K 3.7, Scr 1, Crcl: 58 ml/min  Family History:  Relation Problem Comments  Mother Diabetes   Heart disease     Father Diabetes   Hypertension     Brother Diabetes   Hypertension   Sarcoidosis     Brother Diabetes   Sarcoidosis       Social History:  Alcohol: Smoking:   Diet:  Breakfast: Lunch/Dinner: Snacks: Beverages:  Exercise:  {types:28256}  Home BP readings:  Date SBP/DBP  HR              Average      Wt Readings from Last 3 Encounters:  06/13/24 195 lb 9.6 oz (88.7 kg)  01/02/24 199 lb (90.3 kg)  06/12/23 193 lb (87.5 kg)   BP Readings from Last 3 Encounters:  06/13/24 (!) 152/86  06/10/24 (!) 152/78  01/02/24 124/72   Pulse Readings from Last 3 Encounters:  06/13/24 77  06/10/24 66  01/02/24 60    Renal function: CrCl cannot be calculated (Patient's most recent lab result is older than the maximum 21 days allowed.).  Past Medical  History:  Diagnosis Date   Anxiety    ASCUS (atypical squamous cells of undetermined significance) on Pap smear 11/2011   negative high risk HPV   Diabetes mellitus without complication (HCC)    DVT (deep venous thrombosis) (HCC)    on birth control pills as a teenager   Hydrosalpinx    Hypertension    Osteopenia 09/2017   T score -2.0 FRAX 4.5% / 0.9%   TIA (transient ischemic attack) 07/2022   Urinary incontinence    VAIN I (vaginal intraepithelial neoplasia grade I) 11/2010   negative colposcopy   Vitamin D  deficiency 06/2015   value 13 recommended 50,000 units weekly 12 with recheck of vitamin D  level    Current Outpatient Medications on File Prior to Visit  Medication Sig Dispense Refill   aspirin  EC 81 MG tablet Take 1 tablet (81 mg total) by mouth daily. Swallow whole. 30 tablet 12   FLUoxetine  (PROZAC ) 20 MG capsule Take 1 capsule (20 mg total) by mouth daily. 30 capsule 12   gabapentin (NEURONTIN) 300 MG capsule Take 300 mg by mouth daily.     ibuprofen  (ADVIL ) 200 MG tablet Take  400 mg by mouth 2 (two) times daily as needed for headache or mild pain.     LORazepam  (ATIVAN ) 0.5 MG tablet Take 0.5 mg by mouth 2 (two) times daily as needed for anxiety.     metformin (FORTAMET) 1000 MG (OSM) 24 hr tablet Take 1,000 mg by mouth daily with breakfast.     nicotine  (NICODERM CQ  - DOSED IN MG/24 HOURS) 14 mg/24hr patch Place 1 patch (14 mg total) onto the skin daily. 28 patch 0   nicotine  (NICODERM CQ  - DOSED IN MG/24 HR) 7 mg/24hr patch Place 1 patch (7 mg total) onto the skin daily. 28 patch 0   olmesartan -hydrochlorothiazide  (BENICAR  HCT) 40-25 MG tablet TAKE 1 TABLET BY MOUTH DAILY 90 tablet 3   omeprazole  (PRILOSEC) 40 MG capsule Take 1 capsule (40 mg total) by mouth daily. 30 capsule 0   rosuvastatin  (CRESTOR ) 10 MG tablet TAKE 1 TABLET(10 MG) BY MOUTH DAILY 90 tablet 2   traZODone (DESYREL) 50 MG tablet Take 50 mg by mouth at bedtime as needed.     triamcinolone  cream  (KENALOG) 0.1 % Apply 1 Application topically 2 (two) times daily as needed (irritation).     No current facility-administered medications on file prior to visit.    Allergies  Allergen Reactions   Amlodipine  Besylate     Muscle pain, leg weakness, heavy feeling in legs    Cymbalta [Duloxetine Hcl] Nausea Only   Effexor [Venlafaxine] Other (See Comments)    Nightmares    Flexeril  [Cyclobenzaprine ] Other (See Comments)    Urinary frequency   Prevacid [Lansoprazole] Other (See Comments)    Lack of therapeutic effect   Prilosec [Omeprazole ] Other (See Comments)    Lack of therapeutic effect   Wellbutrin Xl [Bupropion] Other (See Comments)    Nightmares    Codeine Palpitations    There were no vitals taken for this visit.   Assessment/Plan:  1. Hypertension -  No problem-specific Assessment & Plan notes found for this encounter.      Thank you  Nurah Petrides E. Alesandro Stueve, Pharm.D [facility] [address], [place], [place] [address] Phone: [phone]; Fax: [phone]

## 2024-09-11 ENCOUNTER — Ambulatory Visit: Admitting: *Deleted

## 2024-09-11 DIAGNOSIS — Z720 Tobacco use: Secondary | ICD-10-CM

## 2024-09-11 DIAGNOSIS — J439 Emphysema, unspecified: Secondary | ICD-10-CM | POA: Diagnosis not present

## 2024-09-11 LAB — PULMONARY FUNCTION TEST
DL/VA % pred: 98 %
DL/VA: 4 ml/min/mmHg/L
DLCO cor % pred: 75 %
DLCO cor: 16.24 ml/min/mmHg
DLCO unc % pred: 75 %
DLCO unc: 16.24 ml/min/mmHg
FEF 25-75 Post: 2.7 L/s
FEF 25-75 Pre: 2.36 L/s
FEF2575-%Change-Post: 14 %
FEF2575-%Pred-Post: 127 %
FEF2575-%Pred-Pre: 111 %
FEV1-%Change-Post: 5 %
FEV1-%Pred-Post: 86 %
FEV1-%Pred-Pre: 81 %
FEV1-Post: 2.22 L
FEV1-Pre: 2.1 L
FEV1FVC-%Change-Post: 0 %
FEV1FVC-%Pred-Pre: 110 %
FEV6-%Change-Post: 6 %
FEV6-%Pred-Post: 82 %
FEV6-%Pred-Pre: 77 %
FEV6-Post: 2.67 L
FEV6-Pre: 2.51 L
FEV6FVC-%Pred-Post: 104 %
FEV6FVC-%Pred-Pre: 104 %
FVC-%Change-Post: 6 %
FVC-%Pred-Post: 78 %
FVC-%Pred-Pre: 74 %
FVC-Post: 2.67 L
FVC-Pre: 2.51 L
Post FEV1/FVC ratio: 83 %
Post FEV6/FVC ratio: 100 %
Pre FEV1/FVC ratio: 84 %
Pre FEV6/FVC Ratio: 100 %
RV % pred: 113 %
RV: 2.62 L
TLC % pred: 98 %
TLC: 5.38 L

## 2024-09-11 NOTE — Progress Notes (Signed)
 Full PFT performed today.

## 2024-09-11 NOTE — Patient Instructions (Signed)
 Full PFT performed today.

## 2024-09-17 NOTE — Progress Notes (Unsigned)
 "  Office Visit Note  Patient: Tracey Buchanan             Date of Birth: 12/29/1955           MRN: [id number]             PCP: Tracey Millman, MD Referring: Tracey Newborn, MD Visit Date: 09/30/2024 Occupation: Data Unavailable  Subjective:  No chief complaint on file.   History of Present Illness: Tracey Buchanan is a 69 y.o. female ***     Activities of Daily Living:  Patient reports morning stiffness for *** {minute/hour:19697}.   Patient {ACTIONS;DENIES/REPORTS:21021675::Denies} nocturnal pain.  Difficulty dressing/grooming: {ACTIONS;DENIES/REPORTS:21021675::Denies} Difficulty climbing stairs: {ACTIONS;DENIES/REPORTS:21021675::Denies} Difficulty getting out of chair: {ACTIONS;DENIES/REPORTS:21021675::Denies} Difficulty using hands for taps, buttons, cutlery, and/or writing: {ACTIONS;DENIES/REPORTS:21021675::Denies}  No Rheumatology ROS completed.   PMFS History:  Patient Active Problem List   Diagnosis Date Noted   Emphysema, unspecified (HCC) 06/13/2024   Bilateral leg pain 06/10/2024   TIA (transient ischemic attack) 07/28/2022   Hyperlipidemia 06/28/2022   Atypical chest pain 08/22/2017   Anxiety    Hypertension    DVT (deep venous thrombosis) (HCC)    Osteopenia    Hydrosalpinx     Past Medical History:  Diagnosis Date   Anxiety    ASCUS (atypical squamous cells of undetermined significance) on Pap smear 11/2011   negative high risk HPV   Diabetes mellitus without complication (HCC)    DVT (deep venous thrombosis) (HCC)    on birth control pills as a teenager   Hydrosalpinx    Hypertension    Osteopenia 09/2017   T score -2.0 FRAX 4.5% / 0.9%   TIA (transient ischemic attack) 07/2022   Urinary incontinence    VAIN I (vaginal intraepithelial neoplasia grade I) 11/2010   negative colposcopy   Vitamin D  deficiency 06/2015   value 13 recommended 50,000 units weekly 12 with recheck of vitamin D  level    Family History  Problem Relation Age of  Onset   Diabetes Mother    Heart disease Mother    Diabetes Father    Hypertension Father    Diabetes Brother    Hypertension Brother    Sarcoidosis Brother    Sarcoidosis Brother    Diabetes Brother    Past Surgical History:  Procedure Laterality Date   ABDOMINAL HYSTERECTOMY  2001   Leiomyomata   CARDIAC CATHETERIZATION     MYOMECTOMY ABDOMINAL APPROACH  1996   PELVIC LAPAROSCOPY     Social History[1] Social History   Social History Narrative   Not on file     Immunization History  Administered Date(s) Administered   PNEUMOCOCCAL CONJUGATE-20 04/23/2021     Objective: Vital Signs: There were no vitals taken for this visit.   Physical Exam   Musculoskeletal Exam: ***  CDAI Exam: CDAI Score: -- Patient Global: --; Provider Global: -- Swollen: --; Tender: -- Joint Exam 09/30/2024   No joint exam has been documented for this visit   There is currently no information documented on the homunculus. Go to the Rheumatology activity and complete the homunculus joint exam.  Investigation: No additional findings.  Imaging: No results found.  Recent Labs: Lab Results  Component Value Date   WBC 6.7 06/13/2024   HGB 13.4 06/13/2024   PLT 233.0 06/13/2024   NA 142 01/12/2023   K 3.7 01/12/2023   CL 104 01/12/2023   CO2 25 01/12/2023   GLUCOSE 116 (H) 01/12/2023   BUN 14 01/12/2023   CREATININE  1.05 (H) 01/12/2023   BILITOT 0.4 07/27/2022   ALKPHOS 92 07/27/2022   AST 20 07/27/2022   ALT 21 07/27/2022   PROT 7.4 07/27/2022   ALBUMIN 4.0 07/27/2022   CALCIUM  9.3 06/13/2024   GFRAA >60 02/11/2018    Speciality Comments: No specialty comments available.  Procedures:  No procedures performed Allergies: Amlodipine  besylate, Cymbalta [duloxetine hcl], Effexor [venlafaxine], Flexeril  [cyclobenzaprine ], Prevacid [lansoprazole], Prilosec [omeprazole ], Wellbutrin xl [bupropion], and Codeine   Assessment / Plan:     Visit Diagnoses: No diagnosis  found.  Orders: No orders of the defined types were placed in this encounter.  No orders of the defined types were placed in this encounter.   Face-to-face time spent with patient was *** minutes. Greater than 50% of time was spent in counseling and coordination of care.  Follow-Up Instructions: No follow-ups on file.   Tracey Patterson, LPN  Note - This record has been created using [employer].  Chart creation errors have been sought, but may not always  have been located. Such creation errors do not reflect on  the standard of medical care.    [1]  Social History Tobacco Use   Smoking status: Every Day    Current packs/day: 0.50    Types: Cigarettes   Smokeless tobacco: Never  Vaping Use   Vaping status: Never Used  Substance Use Topics   Alcohol use: Yes    Alcohol/week: 4.0 standard drinks of alcohol    Types: 3 Glasses of wine, 1 Shots of liquor per week   Drug use: No   "

## 2024-09-18 ENCOUNTER — Ambulatory Visit

## 2024-09-24 ENCOUNTER — Ambulatory Visit: Attending: Cardiology

## 2024-09-24 ENCOUNTER — Other Ambulatory Visit (HOSPITAL_COMMUNITY): Payer: Self-pay

## 2024-09-24 VITALS — BP 174/80 | HR 97

## 2024-09-24 DIAGNOSIS — I10 Essential (primary) hypertension: Secondary | ICD-10-CM

## 2024-09-24 MED ORDER — OMRON 3 SERIES BP MONITOR DEVI
1.0000 | Freq: Every day | 0 refills | Status: AC
  Filled 2024-09-24: qty 1, 1d supply, fill #0

## 2024-09-24 NOTE — Patient Instructions (Addendum)
 Changes made by your pharmacist Marykathleen Russi E. Waldron Gerry, PharmD, CPP at today's visit:    Instructions/Changes  (what do you need to do) Your Notes  (what you did and when you did it)  Continue olmesartan /hydrochlorothiazide  40/25 mg daily   2. Check blood pressure daily using cuff   3. PharmD appointment 10/17/24 at 11:30 AM    Bring all of your meds, your BP cuff and your record of home blood pressures to your next appointment.   Lucia Harm E. Duanna Runk, Pharm.D, CPP [facility]. [facility] [address], [place], [place] [address] Phone: [phone]; Fax: [phone]     HOW TO TAKE YOUR BLOOD PRESSURE AT HOME  Rest 5 minutes before taking your blood pressure.  Dont smoke or drink caffeinated beverages for at least 30 minutes before. Take your blood pressure before (not after) you eat. Sit comfortably with your back supported and both feet on the floor (dont cross your legs). Elevate your arm to heart level on a table or a desk. Use the proper sized cuff. It should fit smoothly and snugly around your bare upper arm. There should be enough room to slip a fingertip under the cuff. The bottom edge of the cuff should be 1 inch above the crease of the elbow. Ideally, take 3 measurements at one sitting and record the average.  Important lifestyle changes to control high blood pressure  Intervention  Effect on the BP  Lose extra pounds and watch your waistline Weight loss is one of the most effective lifestyle changes for controlling blood pressure. If you're overweight or obese, losing even a small amount of weight can help reduce blood pressure. Blood pressure might go down by about 1 millimeter of mercury (mm Hg) with each kilogram (about 2.2 pounds) of weight lost.  Exercise regularly As a general goal, aim for at least 30 minutes of moderate physical activity every day. Regular physical activity can lower high blood pressure by about 5 to 8 mm Hg.   Eat a healthy diet Eating a diet rich in whole grains, fruits, vegetables, and low-fat dairy products and low in saturated fat and cholesterol. A healthy diet can lower high blood pressure by up to 11 mm Hg.  Reduce salt (sodium) in your diet Even a small reduction of sodium in the diet can improve heart health and reduce high blood pressure by about 5 to 6 mm Hg.  Limit alcohol One drink equals 12 ounces of beer, 5 ounces of wine, or 1.5 ounces of 80-proof liquor.  Limiting alcohol to less than one drink a day for women or two drinks a day for men can help lower blood pressure by about 4 mm Hg.   If you have any questions or concerns please use My Chart to send questions or call the office at [phone]

## 2024-09-24 NOTE — Assessment & Plan Note (Addendum)
 Assessment: BP is uncontrolled in office BP 173/73  mmHg and repeat 174/80 above the goal (<130/80).  No home BP log provided but reports a home BP reading of 134/70 Patient reports blood pressure is generally at goal of <130/80 and BP is elevated because she didn't take dose today to avoid increased urination.  Tolerates olmesartan /hydrochlorothiazide  well without any side effects - Patient took pill while in office today Labs (04/2024 at [facility]): Scr 1.0, Crcl:, Na 141, Cl 106, K 3.7 Has previously tried amlodipine  (muscle pain) and patient is not willing to retry therapy Denies SOB, palpitation, chest pain, headaches,or swelling We discussed s/sx of stroke and MI and when to seek immediate attention Reiterated the importance of regular exercise and low salt diet; limiting soda intake Encouraged patient to consider nicotine  patches prescribed by PCP and reduce tobacco intake Would like to see BP trends at home considering missed dose today and SBP typically ~20 points lower in office before making changes in BP regimen  Plan: Continue taking olmesartan /hydrochlorothiazide  40/25 mg daily Patient to keep record of BP readings with heart rate and report to us  at the next visit Patient to bring blood pressure cuff for validation by our office; will send rx for Omron if she decides to p/u or continue using cuff at home Will explore additional antihypertensives at next visit if BP log and office BP remain uncontrolled Patient to see PharmD in 3 weeks for follow up

## 2024-09-24 NOTE — Progress Notes (Signed)
 Patient ID: ONYX EDGLEY                 DOB: Jan 14, 1956                      MRN: [id number]      HPI: Tracey Buchanan is a 69 y.o. female referred by Dr. Court to HTN clinic. PMH is significant for HTN, HLD, atypical chest pain and bilateral leg pain.  Patient was last seen by Dr. Court in October 2025 for follow up. BP at that appointment was elevated at 152/78. Patient was asked to keep a 2 week BP log and follow up with PharmD. Of note, she is under life stress and has significant anxiety which may be contributing to her blood pressure.  The patient presents today unsure of the reason for her visit, and the purpose of the appointment was explained to her. She does not believe she has blood pressure issues and feels her readings are generally fine. She did not bring a blood pressure log. She reports having several blood pressure monitors at home; a  BP reading obtained by someone else showed 134/70?mmHg, compared with two of her monitors, and one was found to be accurate and the other one wasn't.   She also does not appear to monitor her blood pressure regularly. She denies shortness of breath, headache, dizziness, or chest pain. She does report experiencing stress and anxiety, noting the loss of her mother and several other family members, and feels this has contributed to elevated blood pressure at times. She has an emotional support animal and states she is doing much better than before.  Current HTN meds: olmesartan /hydrochlorothiazide  40/25 mg daily Previously tried: amlodipine  (muscle pain, leg weakness) BP goal: < 130/80 Labs (04/2024 at [facility]): Scr 1.0, Crcl:, Na 141, Cl 106, K 3.7  Family History:  Relation Problem Comments  Mother Diabetes   Heart disease     Father Diabetes   Hypertension     Brother Diabetes   Hypertension   Sarcoidosis     Brother Diabetes   Sarcoidosis       Social History:  Alcohol: once a week, it depends shot glass 1/2 glass maybe twice a  week  Smoking: 1 pack of cigarettes last 3 days; Tobacco cessation currently managed by PCP; Patches prescribed and at home but she has not started using yet   Diet:  She uses Mrs. Dash when seasoning, no salt. For breakfast, she typically eats foods that are baked or air-fried. Her lunch and dinner often include mustard greens, collards, homemade soups, and asparagus. Her beverages usually consist of mini Canada Dry or Sprite sodas and water.  Exercise:  Walks dog daily  Stationary bike twice per week   Home BP readings:  No blood pressure log was provided today. The patient reports that her blood pressure is elevated because she did not take her medication in order to avoid increased urination  Wt Readings from Last 3 Encounters:  06/13/24 195 lb 9.6 oz (88.7 kg)  01/02/24 199 lb (90.3 kg)  06/12/23 193 lb (87.5 kg)   BP Readings from Last 3 Encounters:  09/24/24 (!) 174/80  06/13/24 (!) 152/86  06/10/24 (!) 152/78   Pulse Readings from Last 3 Encounters:  09/24/24 97  06/13/24 77  06/10/24 66    Renal function: CrCl cannot be calculated (Patient's most recent lab result is older than the maximum 21 days allowed.).  Past Medical History:  Diagnosis Date   Anxiety    ASCUS (atypical squamous cells of undetermined significance) on Pap smear 11/2011   negative high risk HPV   Diabetes mellitus without complication (HCC)    DVT (deep venous thrombosis) (HCC)    on birth control pills as a teenager   Hydrosalpinx    Hypertension    Osteopenia 09/2017   T score -2.0 FRAX 4.5% / 0.9%   TIA (transient ischemic attack) 07/2022   Urinary incontinence    VAIN I (vaginal intraepithelial neoplasia grade I) 11/2010   negative colposcopy   Vitamin D  deficiency 06/2015   value 13 recommended 50,000 units weekly 12 with recheck of vitamin D  level    Medications Ordered Prior to Encounter[1]  Allergies[2]  Blood pressure (!) 174/80, pulse 97.   Assessment/Plan:  1.  Hypertension -  Hypertension Assessment: BP is uncontrolled in office BP 173/73  mmHg and repeat 174/80 above the goal (<130/80).  No home BP log provided but reports a home BP reading of 134/70 Patient reports blood pressure is generally at goal of <130/80 and BP is elevated because she didn't take dose today to avoid increased urination.  Tolerates olmesartan /hydrochlorothiazide  well without any side effects - Patient took pill while in office today Labs (04/2024 at [facility]): Scr 1.0, Crcl:, Na 141, Cl 106, K 3.7 Has previously tried amlodipine  (muscle pain) and patient is not willing to retry therapy Denies SOB, palpitation, chest pain, headaches,or swelling We discussed s/sx of stroke and MI and when to seek immediate attention Reiterated the importance of regular exercise and low salt diet; limiting soda intake Encouraged patient to consider nicotine  patches prescribed by PCP and reduce tobacco intake Would like to see BP trends at home considering missed dose today and SBP typically ~20 points lower in office before making changes in BP regimen  Plan: Continue taking olmesartan /hydrochlorothiazide  40/25 mg daily Patient to keep record of BP readings with heart rate and report to us  at the next visit Patient to bring blood pressure cuff for validation by our office; will send rx for Omron if she decides to p/u or continue using cuff at home Will explore additional antihypertensives at next visit if BP log and office BP remain uncontrolled Patient to see PharmD in 3 weeks for follow up   Thank you  Tracey Buchanan, Pharm.D [facility] [address], [place], [place] [address] Phone: [phone]; Fax: [phone]      [1]  Current Outpatient Medications on File Prior to Visit  Medication Sig Dispense Refill   aspirin  EC 81 MG tablet Take 1 tablet (81 mg total) by mouth daily. Swallow whole. 30 tablet 12   FLUoxetine   (PROZAC ) 20 MG capsule Take 1 capsule (20 mg total) by mouth daily. 30 capsule 12   gabapentin (NEURONTIN) 300 MG capsule Take 300 mg by mouth daily.     ibuprofen  (ADVIL ) 200 MG tablet Take 400 mg by mouth 2 (two) times daily as needed for headache or mild pain.     LORazepam  (ATIVAN ) 0.5 MG tablet Take 0.5 mg by mouth 2 (two) times daily as needed for anxiety.     metformin (FORTAMET) 1000 MG (OSM) 24 hr tablet Take 1,000 mg by mouth daily with breakfast.     nicotine  (NICODERM CQ  - DOSED IN MG/24 HOURS) 14 mg/24hr patch Place 1 patch (14 mg total) onto the skin daily. 28 patch 0   nicotine  (NICODERM CQ  -  DOSED IN MG/24 HR) 7 mg/24hr patch Place 1 patch (7 mg total) onto the skin daily. 28 patch 0   olmesartan -hydrochlorothiazide  (BENICAR  HCT) 40-25 MG tablet TAKE 1 TABLET BY MOUTH DAILY 90 tablet 3   omeprazole  (PRILOSEC) 40 MG capsule Take 1 capsule (40 mg total) by mouth daily. 30 capsule 0   rosuvastatin  (CRESTOR ) 10 MG tablet TAKE 1 TABLET(10 MG) BY MOUTH DAILY 90 tablet 2   traZODone (DESYREL) 50 MG tablet Take 50 mg by mouth at bedtime as needed.     triamcinolone  cream (KENALOG) 0.1 % Apply 1 Application topically 2 (two) times daily as needed (irritation).     No current facility-administered medications on file prior to visit.  [2]  Allergies Allergen Reactions   Amlodipine  Besylate     Muscle pain, leg weakness, heavy feeling in legs    Cymbalta [Duloxetine Hcl] Nausea Only   Effexor [Venlafaxine] Other (See Comments)    Nightmares    Flexeril  [Cyclobenzaprine ] Other (See Comments)    Urinary frequency   Prevacid [Lansoprazole] Other (See Comments)    Lack of therapeutic effect   Prilosec [Omeprazole ] Other (See Comments)    Lack of therapeutic effect   Wellbutrin Xl [Bupropion] Other (See Comments)    Nightmares    Codeine Palpitations

## 2024-09-27 ENCOUNTER — Ambulatory Visit

## 2024-09-27 VITALS — BP 151/82 | HR 60 | Temp 97.5°F | Resp 13 | Ht 65.0 in | Wt 200.0 lb

## 2024-09-27 DIAGNOSIS — R7689 Other specified abnormal immunological findings in serum: Secondary | ICD-10-CM

## 2024-09-27 DIAGNOSIS — M791 Myalgia, unspecified site: Secondary | ICD-10-CM | POA: Diagnosis not present

## 2024-09-27 DIAGNOSIS — M7918 Myalgia, other site: Secondary | ICD-10-CM | POA: Diagnosis not present

## 2024-09-27 MED ORDER — AZELASTINE HCL 0.1 % NA SOLN: 2.0000 | Freq: Two times a day (BID) | NASAL | 0 refills | Status: AC

## 2024-09-27 MED ORDER — TIZANIDINE HCL 4 MG PO TABS: 4.0000 mg | ORAL_TABLET | Freq: Every day | ORAL | 0 refills | Status: AC

## 2024-09-27 NOTE — Progress Notes (Signed)
 "  Office Visit Note  Patient: Tracey Buchanan             Date of Birth: Sep 04, 1956           MRN: [id number]             PCP: Rolinda Millman, MD Referring: Pleas Newborn, MD Visit Date: 09/27/2024 Occupation: Data Unavailable  Subjective:  New Patient (Initial Visit) (Abnormal Labs and Joint Pain)   History of Present Illness: Tracey Buchanan is a 69 y.o. female who is presenting due to positive ANA.  She admits to dry eyes. She denies dry mouth. She admits to oral ulcers. She admits to some patchy hair loss she attributes to stress. She denies photosensitivity or raynaud's symptoms.  She denies any significant joint pain. She admits to pain in the muscles of her calves, which is where her morning stiffness is most noticeable. She denies any morning stiffness in her joints. She does also admit to issues with pain in the muscles of her mid-back and cervical spine.  She admits to issues with nasal congestion recently, and is out of her sinus spray.  She denies any other acute symptoms at this time. She denies shortness of breath or cough. She admits to 7 cigarettes daily.   Activities of Daily Living:  Patient reports morning stiffness for 1 hours.   Patient Reports nocturnal pain.  Difficulty dressing/grooming: Denies Difficulty climbing stairs: Denies Difficulty getting out of chair: Denies Difficulty using hands for taps, buttons, cutlery, and/or writing: Denies  Review of Systems  Constitutional:  Negative for fatigue.  HENT:  Negative for mouth sores and mouth dryness.   Eyes:  Positive for dryness.  Respiratory:  Negative for shortness of breath.   Cardiovascular:  Negative for chest pain and palpitations.  Gastrointestinal:  Negative for blood in stool, constipation and diarrhea.  Endocrine: Negative for increased urination.  Genitourinary:  Negative for involuntary urination.  Musculoskeletal:  Positive for joint pain, gait problem, joint pain, myalgias, morning  stiffness and myalgias. Negative for joint swelling, muscle weakness and muscle tenderness.  Skin:  Negative for color change, rash, hair loss and sensitivity to sunlight.  Allergic/Immunologic: Negative for susceptible to infections.  Neurological:  Positive for headaches. Negative for dizziness.  Hematological:  Negative for swollen glands.  Psychiatric/Behavioral:  Positive for sleep disturbance. Negative for depressed mood. The patient is nervous/anxious.     PMFS History:  Patient Active Problem List   Diagnosis Date Noted   Emphysema, unspecified (HCC) 06/13/2024   Bilateral leg pain 06/10/2024   TIA (transient ischemic attack) 07/28/2022   Hyperlipidemia 06/28/2022   Atypical chest pain 08/22/2017   Anxiety    Hypertension    DVT (deep venous thrombosis) (HCC)    Osteopenia    Hydrosalpinx     Past Medical History:  Diagnosis Date   Anxiety    ASCUS (atypical squamous cells of undetermined significance) on Pap smear 11/2011   negative high risk HPV   Diabetes mellitus without complication (HCC)    DVT (deep venous thrombosis) (HCC)    on birth control pills as a teenager   Hydrosalpinx    Hypertension    Osteopenia 09/2017   T score -2.0 FRAX 4.5% / 0.9%   TIA (transient ischemic attack) 07/2022   Urinary incontinence    VAIN I (vaginal intraepithelial neoplasia grade I) 11/2010   negative colposcopy   Vitamin D  deficiency 06/2015   value 13 recommended 50,000 units weekly 12 with recheck  of vitamin D  level    Family History  Problem Relation Age of Onset   Diabetes Mother    Heart disease Mother    Heart disease Father    Diabetes Father    Hypertension Father    Prostate cancer Father    Diabetes Brother    Hypertension Brother    Sarcoidosis Brother    Sarcoidosis Brother    Diabetes Brother    Hypertension Brother    Diabetes Brother    Diabetes Maternal Grandmother    Heart attack Maternal Grandmother    Past Surgical History:  Procedure  Laterality Date   ABDOMINAL HYSTERECTOMY  09/06/1999   Leiomyomata   CARDIAC CATHETERIZATION     MYOMECTOMY ABDOMINAL APPROACH  09/05/1994   PELVIC LAPAROSCOPY     TONSILLECTOMY     Social History[1] Social History   Social History Narrative   Not on file     Immunization History  Administered Date(s) Administered   PNEUMOCOCCAL CONJUGATE-20 04/23/2021     Objective: Vital Signs: BP (!) 151/82 (BP Location: Left Arm, Patient Position: Sitting)   Pulse 60   Temp (!) 97.5 F (36.4 C)   Resp 13   Ht 5' 5 (1.651 m)   Wt 200 lb (90.7 kg)   BMI 33.28 kg/m    Physical Exam Vitals and nursing note reviewed.  HENT:     Head: Normocephalic and atraumatic.     Nose: Nose normal.  Eyes:     Conjunctiva/sclera: Conjunctivae normal.     Pupils: Pupils are equal, round, and reactive to light.  Pulmonary:     Effort: Pulmonary effort is normal. No respiratory distress.  Skin:    General: Skin is warm and dry.  Neurological:     Mental Status: She is alert. Mental status is at baseline.  Psychiatric:        Mood and Affect: Mood normal.        Behavior: Behavior normal.      Musculoskeletal Exam:   CDAI Exam: CDAI Score: -- Patient Global: --; Provider Global: -- Swollen: 0 ; Tender: 1  Joint Exam 09/27/2024      Right  Left  PIP 2 (finger)   Tender        Investigation: No additional findings.  Imaging: No results found.  Recent Labs: Lab Results  Component Value Date   WBC 6.7 06/13/2024   HGB 13.4 06/13/2024   PLT 233.0 06/13/2024   NA 142 01/12/2023   K 3.7 01/12/2023   CL 104 01/12/2023   CO2 25 01/12/2023   GLUCOSE 116 (H) 01/12/2023   BUN 14 01/12/2023   CREATININE 1.05 (H) 01/12/2023   BILITOT 0.4 07/27/2022   ALKPHOS 92 07/27/2022   AST 20 07/27/2022   ALT 21 07/27/2022   PROT 7.4 07/27/2022   ALBUMIN 4.0 07/27/2022   CALCIUM  9.3 06/13/2024   GFRAA >60 02/11/2018    Speciality Comments: No specialty comments  available.  Procedures:  No procedures performed Allergies: Amlodipine  besylate, Cymbalta [duloxetine hcl], Effexor [venlafaxine], Flexeril  [cyclobenzaprine ], Prevacid [lansoprazole], Prilosec [omeprazole ], Wellbutrin xl [bupropion], and Codeine   Assessment / Plan:     Visit Diagnoses:   Positive ANA (antinuclear antibody)  Patient with low positive ANA (1:40) and diffuse myofascial pain. Patient does not have any features of SLE (no objective malar rash, inflammatory arthritis, Raynaud's, alopecia, recurrent cytopenias) or scleroderma (no sclerodactyly, no Raynaud's).  Positive ANA need not represent presence of clinically active systemic autoimmune disease.  Can be  positive in the normal population, autoimmune thyroid disease (Graves' disease, Hashimoto's thyroiditis etc.), or infection. ANA can be positive in the healthy population at the following rates: ANA 1:40: 20%-30%, ANA 1:80: 10%-15%, ANA 1:160: 5%, ANA 1:320: 3% positive, healthy relative of an SLE patient: 5%-25% positive (usually low titers), and elderly (age >70 years): up to 70% positive at ANA titer 1:40.   Given some of her symptoms, will check sjogren's syndrome antibods(ssa + ssb), Anti-DNA antibody, double-stranded, Anti-Smith antibody, C3 and C4, Protein / creatinine ratio, urine, RNP Antibody, Anti-scleroderma antibody  Myofascial pain syndrome Myalgia Patient with long-standing muscle pain, likely secondary to myofascial pain syndrome. Given worsening of myalgia with activity, will obtain lactic acid at this encounter as well. Plan: tiZANidine  (ZANAFLEX ) 4 MG tablet, Lactic acid, plasma, CK, Aldolase   Orders: Orders Placed This Encounter  Procedures   Sjogren's syndrome antibods(ssa + ssb)   Anti-DNA antibody, double-stranded   Anti-Smith antibody   C3 and C4   Protein / creatinine ratio, urine   RNP Antibody   Anti-scleroderma antibody   Lactic acid, plasma   CK   Aldolase   Meds ordered this encounter   Medications   tiZANidine  (ZANAFLEX ) 4 MG tablet    Sig: Take 1 tablet (4 mg total) by mouth at bedtime.    Dispense:  14 tablet    Refill:  0   azelastine  (ASTELIN ) 0.1 % nasal spray    Sig: Place 2 sprays into both nostrils 2 (two) times daily. Use in each nostril as directed    Dispense:  30 mL    Refill:  0    I personally spent a total of 60 minutes in the care of the patient today including preparing to see the patient, getting/reviewing separately obtained history, performing a medically appropriate exam/evaluation, counseling and educating, placing orders, documenting clinical information in the EHR, and independently interpreting results.   Follow-Up Instructions: Return if symptoms worsen or fail to improve.   Asberry Claw, DO  Note - This record has been created using Animal nutritionist.  Chart creation errors have been sought, but may not always  have been located. Such creation errors do not reflect on  the standard of medical care.     [1]  Social History Tobacco Use   Smoking status: Every Day    Current packs/day: 0.50    Types: Cigarettes    Passive exposure: Never   Smokeless tobacco: Never  Vaping Use   Vaping status: Some Days  Substance Use Topics   Alcohol use: Yes    Alcohol/week: 4.0 standard drinks of alcohol    Types: 3 Glasses of wine, 1 Shots of liquor per week   Drug use: No   "

## 2024-09-30 ENCOUNTER — Ambulatory Visit

## 2024-10-01 ENCOUNTER — Ambulatory Visit

## 2024-10-01 LAB — C3 AND C4
C3 Complement: 165 mg/dL (ref 83–193)
C4 Complement: 30 mg/dL (ref 15–57)

## 2024-10-01 LAB — LACTIC ACID, PLASMA: LACTIC ACID: 1.1 mmol/L (ref 0.4–1.8)

## 2024-10-01 LAB — ALDOLASE: Aldolase: 3.7 U/L

## 2024-10-01 LAB — ANTI-SCLERODERMA ANTIBODY: Scleroderma (Scl-70) (ENA) Antibody, IgG: 1.1 AI — AB

## 2024-10-01 LAB — PROTEIN / CREATININE RATIO, URINE
Creatinine, Urine: 105 mg/dL (ref 20–275)
Protein/Creat Ratio: 76 mg/g{creat} (ref 24–184)
Protein/Creatinine Ratio: 0.076 mg/mg{creat} (ref 0.024–0.184)
Total Protein, Urine: 8 mg/dL (ref 5–24)

## 2024-10-01 LAB — ANTI-DNA ANTIBODY, DOUBLE-STRANDED: ds DNA Ab: 1 [IU]/mL

## 2024-10-01 LAB — CK: Total CK: 110 U/L (ref 20–243)

## 2024-10-01 LAB — ANTI-SMITH ANTIBODY: ENA SM Ab Ser-aCnc: <1 AI

## 2024-10-01 LAB — RNP ANTIBODY: Ribonucleic Protein(ENA) Antibody, IgG: <1 AI

## 2024-10-01 LAB — SJOGREN'S SYNDROME ANTIBODS(SSA + SSB)
SSA (Ro) (ENA) Antibody, IgG: <1 AI
SSB (La) (ENA) Antibody, IgG: <1 AI

## 2024-10-03 ENCOUNTER — Other Ambulatory Visit (HOSPITAL_COMMUNITY): Payer: Self-pay

## 2024-10-04 ENCOUNTER — Telehealth: Payer: Self-pay | Admitting: *Deleted

## 2024-10-04 NOTE — Telephone Encounter (Signed)
 Patient requesting results of PFT. She had to cx last appt due to weather.  Copied from CRM [id number]. Topic: Clinical - Lab/Test Results >> Oct 01, 2024  9:14 AM Joesph PARAS wrote: Reason for CRM: Patient requesting call back regarding recent test results. Patient cancelled appointment for today. Initially refused to provide three identifiers. Patient wants call back regarding results today.

## 2024-10-07 NOTE — Telephone Encounter (Signed)
 Her pulmonary function test is within normal limits.

## 2024-10-07 NOTE — Telephone Encounter (Signed)
"  Pt notified on VM  "

## 2024-10-17 ENCOUNTER — Ambulatory Visit
